# Patient Record
Sex: Female | Born: 1945 | Race: Black or African American | Hispanic: No | Marital: Married | Smoking: Former smoker
Health system: Southern US, Community
[De-identification: ages and names within clinical notes are randomized; demographics above are authoritative.]

## PROBLEM LIST (undated history)

## (undated) DIAGNOSIS — I10 Essential (primary) hypertension: Secondary | ICD-10-CM

## (undated) DIAGNOSIS — E785 Hyperlipidemia, unspecified: Secondary | ICD-10-CM

## (undated) DIAGNOSIS — H409 Unspecified glaucoma: Secondary | ICD-10-CM

## (undated) DIAGNOSIS — K759 Inflammatory liver disease, unspecified: Secondary | ICD-10-CM

## (undated) DIAGNOSIS — H269 Unspecified cataract: Secondary | ICD-10-CM

## (undated) DIAGNOSIS — F329 Major depressive disorder, single episode, unspecified: Secondary | ICD-10-CM

## (undated) DIAGNOSIS — H547 Unspecified visual loss: Secondary | ICD-10-CM

## (undated) DIAGNOSIS — M1990 Unspecified osteoarthritis, unspecified site: Secondary | ICD-10-CM

## (undated) HISTORY — PX: SPINE SURGERY: SHX786

## (undated) HISTORY — PX: BACK SURGERY: SHX140

## (undated) HISTORY — DX: Unspecified visual loss: H54.7

## (undated) HISTORY — PX: EYE SURGERY: SHX253

## (undated) HISTORY — DX: Essential (primary) hypertension: I10

## (undated) HISTORY — DX: Inflammatory liver disease, unspecified: K75.9

## (undated) HISTORY — PX: APPENDECTOMY: SHX54

## (undated) HISTORY — DX: Major depressive disorder, single episode, unspecified: F32.9

## (undated) HISTORY — DX: Unspecified cataract: H26.9

## (undated) HISTORY — PX: COLONOSCOPY: SHX174

## (undated) HISTORY — DX: Unspecified osteoarthritis, unspecified site: M19.90

## (undated) HISTORY — PX: CHOLECYSTECTOMY: SHX55

## (undated) HISTORY — DX: Unspecified glaucoma: H40.9

## (undated) HISTORY — PX: GLAUCOMA SURGERY: SHX656

## (undated) HISTORY — DX: Hyperlipidemia, unspecified: E78.5

---

## 1983-03-20 HISTORY — PX: ABDOMINAL HYSTERECTOMY: SHX81

## 2001-06-12 ENCOUNTER — Other Ambulatory Visit: Admission: RE | Admit: 2001-06-12 | Discharge: 2001-06-12 | Payer: Self-pay | Admitting: Family Medicine

## 2002-06-09 ENCOUNTER — Encounter: Payer: Self-pay | Admitting: Family Medicine

## 2002-06-09 ENCOUNTER — Ambulatory Visit (HOSPITAL_COMMUNITY): Admission: RE | Admit: 2002-06-09 | Discharge: 2002-06-09 | Payer: Self-pay | Admitting: Family Medicine

## 2003-07-22 ENCOUNTER — Ambulatory Visit (HOSPITAL_COMMUNITY): Admission: RE | Admit: 2003-07-22 | Discharge: 2003-07-22 | Payer: Self-pay | Admitting: Family Medicine

## 2004-08-16 ENCOUNTER — Ambulatory Visit: Payer: Self-pay | Admitting: Family Medicine

## 2004-08-23 ENCOUNTER — Ambulatory Visit (HOSPITAL_COMMUNITY): Admission: RE | Admit: 2004-08-23 | Discharge: 2004-08-23 | Payer: Self-pay | Admitting: Family Medicine

## 2004-09-07 ENCOUNTER — Encounter (INDEPENDENT_AMBULATORY_CARE_PROVIDER_SITE_OTHER): Payer: Self-pay | Admitting: General Surgery

## 2004-09-07 ENCOUNTER — Ambulatory Visit (HOSPITAL_COMMUNITY): Admission: RE | Admit: 2004-09-07 | Discharge: 2004-09-07 | Payer: Self-pay | Admitting: General Surgery

## 2004-09-27 ENCOUNTER — Ambulatory Visit: Payer: Self-pay | Admitting: Family Medicine

## 2004-11-15 ENCOUNTER — Ambulatory Visit: Payer: Self-pay | Admitting: Family Medicine

## 2005-09-07 ENCOUNTER — Ambulatory Visit (HOSPITAL_COMMUNITY): Admission: RE | Admit: 2005-09-07 | Discharge: 2005-09-07 | Payer: Self-pay | Admitting: Family Medicine

## 2005-12-17 ENCOUNTER — Encounter: Payer: Self-pay | Admitting: Family Medicine

## 2005-12-17 LAB — CONVERTED CEMR LAB: Pap Smear: NORMAL

## 2005-12-18 ENCOUNTER — Other Ambulatory Visit: Admission: RE | Admit: 2005-12-18 | Discharge: 2005-12-18 | Payer: Self-pay | Admitting: Family Medicine

## 2005-12-18 ENCOUNTER — Ambulatory Visit: Payer: Self-pay | Admitting: Family Medicine

## 2005-12-18 ENCOUNTER — Encounter: Payer: Self-pay | Admitting: Family Medicine

## 2006-03-27 ENCOUNTER — Ambulatory Visit: Payer: Self-pay | Admitting: Family Medicine

## 2006-03-28 ENCOUNTER — Ambulatory Visit (HOSPITAL_COMMUNITY): Admission: RE | Admit: 2006-03-28 | Discharge: 2006-03-28 | Payer: Self-pay | Admitting: Family Medicine

## 2006-04-02 ENCOUNTER — Encounter (HOSPITAL_COMMUNITY): Admission: RE | Admit: 2006-04-02 | Discharge: 2006-05-02 | Payer: Self-pay | Admitting: Family Medicine

## 2006-06-25 ENCOUNTER — Ambulatory Visit: Payer: Self-pay | Admitting: Family Medicine

## 2006-07-05 ENCOUNTER — Ambulatory Visit (HOSPITAL_COMMUNITY): Admission: RE | Admit: 2006-07-05 | Discharge: 2006-07-05 | Payer: Self-pay | Admitting: Family Medicine

## 2006-08-13 ENCOUNTER — Ambulatory Visit: Payer: Self-pay | Admitting: Family Medicine

## 2006-10-11 ENCOUNTER — Ambulatory Visit (HOSPITAL_COMMUNITY): Admission: RE | Admit: 2006-10-11 | Discharge: 2006-10-11 | Payer: Self-pay | Admitting: Family Medicine

## 2006-10-18 ENCOUNTER — Ambulatory Visit: Payer: Self-pay | Admitting: Family Medicine

## 2007-03-20 ENCOUNTER — Encounter: Payer: Self-pay | Admitting: Family Medicine

## 2007-08-21 ENCOUNTER — Encounter: Payer: Self-pay | Admitting: Family Medicine

## 2007-08-21 DIAGNOSIS — E785 Hyperlipidemia, unspecified: Secondary | ICD-10-CM | POA: Insufficient documentation

## 2007-08-21 DIAGNOSIS — I10 Essential (primary) hypertension: Secondary | ICD-10-CM | POA: Insufficient documentation

## 2007-08-25 ENCOUNTER — Ambulatory Visit: Payer: Self-pay | Admitting: Family Medicine

## 2007-08-25 ENCOUNTER — Encounter: Payer: Self-pay | Admitting: Family Medicine

## 2007-08-25 LAB — CONVERTED CEMR LAB
BUN: 15 mg/dL (ref 6–23)
Bilirubin Urine: NEGATIVE
CO2: 25 meq/L (ref 19–32)
Calcium: 10.2 mg/dL (ref 8.4–10.5)
Cholesterol: 237 mg/dL — ABNORMAL HIGH (ref 0–200)
Eosinophils Absolute: 0 10*3/uL (ref 0.0–0.7)
Eosinophils Relative: 1 % (ref 0–5)
Glucose, Bld: 93 mg/dL (ref 70–99)
Glucose, Urine, Semiquant: NEGATIVE
HCT: 40.3 % (ref 36.0–46.0)
Hemoglobin: 12.8 g/dL (ref 12.0–15.0)
Hgb A1c MFr Bld: 5 %
Lymphocytes Relative: 41 % (ref 12–46)
Lymphs Abs: 1.9 10*3/uL (ref 0.7–4.0)
MCV: 94.4 fL (ref 78.0–100.0)
Monocytes Absolute: 0.4 10*3/uL (ref 0.1–1.0)
Monocytes Relative: 9 % (ref 3–12)
Platelets: 273 10*3/uL (ref 150–400)
Protein, U semiquant: NEGATIVE
RBC: 4.27 M/uL (ref 3.87–5.11)
Sodium: 141 meq/L (ref 135–145)
TSH: 1.395 microintl units/mL (ref 0.350–5.50)
Total CHOL/HDL Ratio: 2.7
WBC Urine, dipstick: NEGATIVE
WBC: 4.6 10*3/uL (ref 4.0–10.5)

## 2007-08-27 ENCOUNTER — Encounter: Payer: Self-pay | Admitting: Family Medicine

## 2007-08-28 ENCOUNTER — Encounter: Payer: Self-pay | Admitting: Family Medicine

## 2007-08-28 LAB — CONVERTED CEMR LAB
Albumin: 4.7 g/dL (ref 3.5–5.2)
Alkaline Phosphatase: 86 units/L (ref 39–117)
Bilirubin, Direct: 0.1 mg/dL (ref 0.0–0.3)
Total Bilirubin: 0.8 mg/dL (ref 0.3–1.2)

## 2007-11-14 ENCOUNTER — Ambulatory Visit (HOSPITAL_COMMUNITY): Admission: RE | Admit: 2007-11-14 | Discharge: 2007-11-14 | Payer: Self-pay | Admitting: Family Medicine

## 2007-12-04 ENCOUNTER — Ambulatory Visit: Payer: Self-pay | Admitting: Family Medicine

## 2007-12-04 ENCOUNTER — Other Ambulatory Visit: Admission: RE | Admit: 2007-12-04 | Discharge: 2007-12-04 | Payer: Self-pay | Admitting: Family Medicine

## 2007-12-04 ENCOUNTER — Encounter: Payer: Self-pay | Admitting: Family Medicine

## 2007-12-04 LAB — CONVERTED CEMR LAB: Pap Smear: NORMAL

## 2008-04-06 ENCOUNTER — Ambulatory Visit: Payer: Self-pay | Admitting: Family Medicine

## 2008-04-06 DIAGNOSIS — K3189 Other diseases of stomach and duodenum: Secondary | ICD-10-CM | POA: Insufficient documentation

## 2008-04-06 DIAGNOSIS — R1013 Epigastric pain: Secondary | ICD-10-CM

## 2008-04-07 LAB — CONVERTED CEMR LAB
ALT: 13 units/L (ref 0–35)
AST: 14 units/L (ref 0–37)
Albumin: 4.3 g/dL (ref 3.5–5.2)
Cholesterol: 207 mg/dL — ABNORMAL HIGH (ref 0–200)
HDL: 77 mg/dL (ref >39)
Total Bilirubin: 0.8 mg/dL (ref 0.3–1.2)
Total CHOL/HDL Ratio: 2.7
VLDL: 16 mg/dL (ref 0–40)

## 2008-05-03 ENCOUNTER — Ambulatory Visit (HOSPITAL_COMMUNITY): Admission: RE | Admit: 2008-05-03 | Discharge: 2008-05-03 | Payer: Self-pay | Admitting: General Surgery

## 2008-05-03 ENCOUNTER — Encounter (INDEPENDENT_AMBULATORY_CARE_PROVIDER_SITE_OTHER): Payer: Self-pay | Admitting: General Surgery

## 2008-05-05 ENCOUNTER — Encounter: Payer: Self-pay | Admitting: Family Medicine

## 2008-05-27 ENCOUNTER — Ambulatory Visit: Payer: Self-pay | Admitting: Family Medicine

## 2008-07-16 ENCOUNTER — Ambulatory Visit (HOSPITAL_COMMUNITY): Admission: RE | Admit: 2008-07-16 | Discharge: 2008-07-16 | Payer: Self-pay | Admitting: Family Medicine

## 2008-07-20 ENCOUNTER — Encounter: Payer: Self-pay | Admitting: Family Medicine

## 2008-08-19 ENCOUNTER — Ambulatory Visit: Payer: Self-pay | Admitting: Family Medicine

## 2008-08-19 DIAGNOSIS — M79609 Pain in unspecified limb: Secondary | ICD-10-CM | POA: Insufficient documentation

## 2008-08-19 DIAGNOSIS — M549 Dorsalgia, unspecified: Secondary | ICD-10-CM | POA: Insufficient documentation

## 2008-08-26 ENCOUNTER — Encounter: Payer: Self-pay | Admitting: Family Medicine

## 2008-08-26 LAB — CONVERTED CEMR LAB: Creatinine, Ser: 1.01 mg/dL (ref 0.40–1.20)

## 2008-08-27 ENCOUNTER — Ambulatory Visit (HOSPITAL_COMMUNITY): Admission: RE | Admit: 2008-08-27 | Discharge: 2008-08-27 | Payer: Self-pay | Admitting: Family Medicine

## 2008-12-07 ENCOUNTER — Ambulatory Visit (HOSPITAL_COMMUNITY): Admission: RE | Admit: 2008-12-07 | Discharge: 2008-12-07 | Payer: Self-pay | Admitting: Family Medicine

## 2009-03-24 ENCOUNTER — Encounter: Payer: Self-pay | Admitting: Family Medicine

## 2009-04-14 ENCOUNTER — Ambulatory Visit: Payer: Self-pay | Admitting: Family Medicine

## 2009-04-14 DIAGNOSIS — R5381 Other malaise: Secondary | ICD-10-CM | POA: Insufficient documentation

## 2009-04-14 DIAGNOSIS — R5383 Other fatigue: Secondary | ICD-10-CM

## 2009-05-31 ENCOUNTER — Encounter (INDEPENDENT_AMBULATORY_CARE_PROVIDER_SITE_OTHER): Payer: Self-pay | Admitting: *Deleted

## 2010-04-09 ENCOUNTER — Encounter: Payer: Self-pay | Admitting: Family Medicine

## 2010-04-20 NOTE — Letter (Signed)
Summary: misc  misc   Imported By: Curtis Sites 09/07/2009 12:36:28  _____________________________________________________________________  External Attachment:    Type:   Image     Comment:   External Document

## 2010-04-20 NOTE — Letter (Signed)
Summary: 1st Missed Appt.  [facility]  [address]   [place], [place] [address]   Phone: [phone]  Fax: [phone]    May 31, 2009  MRN: [id number]  CORRINNA KARAPETYAN [address] [place], [place]  [address]  Dear Ms. Corine Shelter,  At [facility], we make every attempt to fit patients into our schedule by reserving several appointment slots for same-day appointments.  However, we cannot always make appointments for patients the same day they are calling.  At the end of the day, we look back at our schedule and find that because of last-minute cancellations and patients not showing up for their scheduled appointments, we have several appointment slots that are left open and could have been used by another person who really needed it.  In the past, you may have been one of the patients who could not get in when you needed to.  But recently, you were one of the patients with an appointment that you didn't show up for or canceled too late for [place] to fill it.  We choose not to charge no-show or last minute cancellation fees to our patients, like many other offices do.  We do not wish to institute that policy and hope we never have to.  However, we kindly request that you assist [place] by providing at least 24 hours' notice if you can't make your appointment.  If no-shows or late cancellations become habitual (i.e. Three or more in a one-year period), we may terminate the physician-patient relationship.    Thank you for your consideration and cooperation.   Altamease Oiler

## 2010-04-20 NOTE — Miscellaneous (Signed)
Summary: refills  Clinical Lists Changes  Medications: Rx of LISINOPRIL-HYDROCHLOROTHIAZIDE 10-12.5 MG TABS (LISINOPRIL-HYDROCHLOROTHIAZIDE) Take 1 tablet by mouth once a day;  #30 x 2;  Signed;  Entered by: Worthy Keeler LPN;  Authorized by: Syliva Overman MD;  Method used: [facility] [phone]*, [address], [place], [place], [place]  [address], Ph: [phone] or [phone], Fax: [phone]    Prescriptions: LISINOPRIL-HYDROCHLOROTHIAZIDE 10-12.5 MG TABS (LISINOPRIL-HYDROCHLOROTHIAZIDE) Take 1 tablet by mouth once a day  #30 x 2   Entered by:   Worthy Keeler LPN   Authorized by:   Syliva Overman MD   Signed by:   Worthy Keeler LPN on 52/84/1324   Method used:   Electronically to        [facility] [phone]* (retail)       [address]       [place], [place]  [address]       Ph: [phone] or [phone]       Fax: [phone]   RxID:   [id number]

## 2010-04-20 NOTE — Letter (Signed)
Summary: xray  xray   Imported By: Curtis Sites 09/07/2009 12:37:26  _____________________________________________________________________  External Attachment:    Type:   Image     Comment:   External Document

## 2010-04-20 NOTE — Letter (Signed)
Summary: history and physical  history and physical   Imported By: Curtis Sites 09/07/2009 12:41:13  _____________________________________________________________________  External Attachment:    Type:   Image     Comment:   External Document

## 2010-04-20 NOTE — Letter (Signed)
Summary: labs  labs   Imported By: Curtis Sites 09/07/2009 12:38:18  _____________________________________________________________________  External Attachment:    Type:   Image     Comment:   External Document

## 2010-04-20 NOTE — Letter (Signed)
Summary: demographic  demographic   Imported By: Curtis Sites 09/07/2009 12:42:01  _____________________________________________________________________  External Attachment:    Type:   Image     Comment:   External Document

## 2010-04-20 NOTE — Assessment & Plan Note (Signed)
Summary: office visit   Vital Signs:  Patient profile:   65 year old female Menstrual status:  hysterectomy Height:      65 inches Weight:      118.50 pounds BMI:     19.79 O2 Sat:      96 % Pulse rate:   80 / minute Pulse rhythm:   regular Resp:     16 per minute BP sitting:   110 / 70  (right arm)  Vitals Entered By: Everitt Amber (April 14, 2009 8:32 AM) CC: Follow up chronic problems Is Patient Diabetic? No Pain Assessment Patient in pain? no        CC:  Follow up chronic problems.  History of Present Illness: Reports  thatshe has been doing well. Denies recent fever or chills. Denies sinus pressure, nasal congestion , ear pain or sore throat. Denies chest congestion, or cough productive of sputum. Denies chest pain, palpitations, PND, orthopnea or leg swelling. Denies abdominal pain, nausea, vomitting, diarrhea or constipation. Denies change in bowel movements or bloody stool. Denies dysuria , frequency, incontinence or hesitancy. Denies  joint pain, swelling, or reduced mobility. Denies headaches, vertigo, seizures. Denies depression, anxiety or insomnia. Denies  rash, lesions, or itch.     Current Medications (verified): 1)  Caltrate 600+d 600-400 Mg-Unit  Tabs (Calcium Carbonate-Vitamin D) .... One Tab By Mouth Two Times A Day 2)  Womens Multivitamin Plus  Tabs (Multiple Vitamins-Minerals) .... Take 1 Tablet By Mouth Once A Day 3)  Lisinopril-Hydrochlorothiazide 10-12.5 Mg Tabs (Lisinopril-Hydrochlorothiazide) .... Take 1 Tablet By Mouth Once A Day  Allergies (verified): No Known Drug Allergies  Social History: Unemployed Married Two children Never Smoked Alcohol use-no Drug use-no  Review of Systems      See HPI Eyes:  Denies blurring and discharge. Psych:  Complains of anxiety; denies depression; hoping to ghain full time employment once more. Endo:  Denies cold intolerance, excessive hunger, excessive thirst, excessive urination, heat  intolerance, polyuria, and weight change. Heme:  Denies abnormal bruising and bleeding. Allergy:  Denies hives or rash.  Physical Exam  General:  alert, well-nourished, and well-hydrated.  in pain. HEENT: No facial asymmetry,  EOMI, No sinus tenderness, TM's Clear, oropharynx  pink and moist.   Chest: Clear to auscultation bilaterally.  CVS: S1, S2, No murmurs, No S3.   Abd: Soft, Nontender.  MS: adequate ROm spine, hips, shoulders and knees  Ext: No edema.   CNS: CN 2-12 intactnormal power and tone throughout  Skin: Intact, no visible lesions or rashes.  Psych: Good eye contact, normal affect.  Memory intact, not anxious or depressed appearing.    Impression & Recommendations:  Problem # 1:  HYPERTENSION (ICD-401.9) Assessment Unchanged  Her updated medication list for this problem includes:    Lisinopril-hydrochlorothiazide 10-12.5 Mg Tabs (Lisinopril-hydrochlorothiazide) .Marland Kitchen... Take 1 tablet by mouth once a day  Orders: T-Basic Metabolic Panel [id number])  BP today: 110/70 Prior BP: 110/80 (08/19/2008)  Labs Reviewed: K+: 4.4 (08/25/2007) Creat: : 1.01 (08/26/2008)   Chol: 207 (04/06/2008)   HDL: 77 (04/06/2008)   LDL: 114 (04/06/2008)   TG: 81 (04/06/2008)  Problem # 2:  DYSLIPIDEMIA (ICD-272.4) Assessment: Comment Only  Orders: T-Lipid Profile [id number])  Labs Reviewed: SGOT: 14 (04/06/2008)   SGPT: 13 (04/06/2008)   HDL:77 (04/06/2008), 88 (08/25/2007)  LDL:114 (04/06/2008), 135 (08/25/2007)  Chol:207 (04/06/2008), 237 (08/25/2007)  Trig:81 (04/06/2008), 68 (08/25/2007)  Complete Medication List: 1)  Caltrate 600+d 600-400 Mg-unit Tabs (Calcium carbonate-vitamin d) .... One tab by  mouth two times a day 2)  Womens Multivitamin Plus Tabs (Multiple vitamins-minerals) .... Take 1 tablet by mouth once a day 3)  Lisinopril-hydrochlorothiazide 10-12.5 Mg Tabs (Lisinopril-hydrochlorothiazide) .... Take 1 tablet by mouth once a day 4)  Ranitidine Hcl 300 Mg Tabs  (Ranitidine hcl) .... Take 1 tablet by mouth once a day as needed 5)  Loratadine 10 Mg Tabs (Loratadine) .... Take 1 tablet by mouth once a day as needed  Other Orders: T-CBC w/Diff ([id number])  Patient Instructions: 1)  F/U  in year. 2)  You need to schedule a mamogram in Sept or after. Calll to arrange to get it free if possble. 3)  [id number], ersp Lubertha Basque 4)  If you note a change in bowel pattern do everything you can to get your colonscopy this year since [place] a family h/o colon cancer, and your last colonscopy was normal in June 2006, it is due June 2011. 5)  We will give you 3 stool cards to return for testing for hidden blood. 6)  BMP prior to visit, ICD-9: 7)  Lipid Panel prior to visit, ICD-9:  fasting asap 8)  CBC w/ Diff prior to visit, ICD-9: Prescriptions: LORATADINE 10 MG TABS (LORATADINE) Take 1 tablet by mouth once a day as needed  #30 x 5   Entered by:   Everitt Amber   Authorized by:   Syliva Overman MD   Signed by:   Everitt Amber on 04/14/2009   Method used:   Electronically to        [facility]  [address] (retail)       [address]       [place], [place]  [address]       Ph: [phone]       Fax: [phone]   RxID:   [id number] RANITIDINE HCL 300 MG TABS (RANITIDINE HCL) Take 1 tablet by mouth once a day as needed  #30 x 5   Entered by:   Everitt Amber   Authorized by:   Syliva Overman MD   Signed by:   Everitt Amber on 04/14/2009   Method used:   Electronically to        [facility]  [address] (retail)       [address]       [place], [place]  [address]       Ph: [phone]       Fax: [phone]   RxID:   [id number] LISINOPRIL-HYDROCHLOROTHIAZIDE 10-12.5 MG TABS (LISINOPRIL-HYDROCHLOROTHIAZIDE) Take 1 tablet by mouth once a day  #30 x 11   Entered by:   Everitt Amber   Authorized by:   Syliva Overman MD   Signed by:   Everitt Amber on 04/14/2009   Method used:   [facility]        [facility] [phone]* (retail)       [address]       [place], [place]  [address]       Ph: [phone] or [phone]       Fax: [phone]   RxID:   [id number]

## 2010-04-20 NOTE — Letter (Signed)
Summary: progress  progress   Imported By: Curtis Sites 09/07/2009 12:39:54  _____________________________________________________________________  External Attachment:    Type:   Image     Comment:   External Document

## 2010-07-04 LAB — HEPATIC FUNCTION PANEL
AST: 18 U/L (ref 0–37)
Albumin: 4.2 g/dL (ref 3.5–5.2)
Total Protein: 7.3 g/dL (ref 6.0–8.3)

## 2010-07-04 LAB — BASIC METABOLIC PANEL
BUN: 15 mg/dL (ref 6–23)
Creatinine, Ser: 0.9 mg/dL (ref 0.4–1.2)
GFR calc non Af Amer: >60 mL/min (ref >60)

## 2010-07-04 LAB — CBC
Platelets: 214 10*3/uL (ref 150–400)
RDW: 13.6 % (ref 11.5–15.5)

## 2010-07-27 ENCOUNTER — Other Ambulatory Visit: Payer: Self-pay | Admitting: Family Medicine

## 2010-08-01 NOTE — Op Note (Signed)
NAMEPHARRAH, ROTTMAN             ACCOUNT NO.:  [id number]   MEDICAL RECORD NO.:  [id number]          PATIENT TYPE:  AMB   LOCATION:  DAY                           FACILITY:  [facility]   PHYSICIAN:  Dalia Heading, M.D.  DATE OF BIRTH:  09/04/1945   DATE OF PROCEDURE:  05/03/2008  DATE OF DISCHARGE:                               OPERATIVE REPORT   PREOPERATIVE DIAGNOSIS:  Chronic cholecystitis.   POSTOPERATIVE DIAGNOSIS:  Chronic cholecystitis.   PROCEDURE:  Laparoscopic cholecystectomy.   SURGEON:  Dalia Heading, MD   ANESTHESIA:  General endotracheal.   INDICATIONS:  The patient is a 65 year old white female who presents  with biliary colic secondary to chronic cholecystitis.  The risks and  benefits of the procedure including bleeding, infection, hepatobiliary,  the possibility of an open procedure were fully explained to the  patient, gave informed consent.   PROCEDURE NOTE:  The patient was placed in supine position.  After  induction of general endotracheal anesthesia, the abdomen was prepped  and draped using usual sterile technique with Betadine.  Surgical site  confirmation was performed.   An infraumbilical incision was made down to fascia.  A Veress needle was  introduced.  The abdominal cavity and confirmation of placement was done  using the saline drop test.  The abdomen was then insufflated to 16 mmHg  pressure.  A 11-mm trocar was introduced into the abdominal cavity under  direct visualization without difficulty.  The patient was placed in  reversed Trendelenburg position.  Additional, 11-mm trocars placed in  the epigastric region and 5-mm trocars were placed in the right upper  quadrant right flank regions.  The liver was inspected and noted to be  within normal limits.  The gallbladder was retracted superior and  laterally.  The dissection was begun around the infundibulum of the  gallbladder.  Cystic duct was first identified.  Juncture to the  infundibulum fully identified.  Endoclips placed proximally and distally  on the cystic duct and cystic duct was divided.  This likewise done in  the cystic artery.  Gallbladder then freed away from the gallbladder  fossa using Bovie electrocautery.  Gallbladder delivered through the  epigastric trocar site using Endocatch bag.  Gallbladder fossa was  inspected.  No abnormal bleeding or bile leakage was noted.  Surgicel  was placed in the gallbladder fossa.  All fluid and air were then  evacuated from the abdominal cavity prior to removal of the trocars.   All wounds were irrigated with normal saline.  All wounds were injected  with 0.5 cm Sensorcaine.  The infraumbilical fascia as well as  epigastric fascia reapproximated using a 3-0 Vicryl interrupted sutures.  All skin incisions were closed using staples.  Betadine ointment and dry  sterile dressings were applied.   All tape and needle counts were correct at the end of the procedure.  The patient was extubated in the operating room and went back to  recovery room in awake and stable condition.   COMPLICATIONS:  None.   SPECIMEN:  Gallbladder.   BLOOD LOSS:  Minimal.  Dalia Heading, M.D.  Electronically Signed     MAJ/MEDQ  D:  05/03/2008  T:  05/03/2008  Job:  [id number]   cc:   Milus Mallick. Lodema Hong, M.D.  Fax: [phone]

## 2010-08-01 NOTE — H&P (Signed)
Christina Miles, Christina Miles             ACCOUNT NO.:  [id number]   MEDICAL RECORD NO.:  [id number]          PATIENT TYPE:  AMB   LOCATION:  DAY                           FACILITY:  [facility]   PHYSICIAN:  Dalia Heading, M.D.  DATE OF BIRTH:  [birth year]   DATE OF ADMISSION:  DATE OF DISCHARGE:  LH                              HISTORY & PHYSICAL   CHIEF COMPLAINT:  Chronic cholecystitis.   HISTORY OF PRESENT ILLNESS:  The patient is a 65 year old black female  who was referred for evaluation and treatment for biliary colic  secondary to chronic cholecystitis.  She has been having intermittent  right upper quadrant abdominal pain, nausea, bloating for sometime now.  This is made worse with fatty foods.  Her symptoms have been worsening  recently.  No fever, chills, or jaundice have been noted.   PAST MEDICAL HISTORY:  Hypertension.   PAST SURGICAL HISTORY:  Hysterectomy and back surgery.   CURRENT MEDICATIONS:  1. Omeprazole 20 mg p.o. daily.  2. Lisinopril/hydrochlorothiazide 10-12.5 mg p.o. daily.   ALLERGIES:  No known drug allergies.   REVIEW OF SYSTEMS:  Noncontributory.   PHYSICAL EXAMINATION:  GENERAL:  The patient is a well-developed, well-  nourished black female in no acute distress.  HEENT:  No scleral icterus.  LUNGS:  Clear to auscultation with equal breath sounds bilaterally.  HEART:  Regular rate and rhythm without S3, S4, or murmurs.  ABDOMEN:  Soft and nondistended.  She is slightly tender in the right  upper quadrant to palpation.  No hepatosplenomegaly, rigidity, or  hernias are noted.   IMPRESSION:  Chronic cholecystitis.   PLAN:  The patient is scheduled for laparoscopic cholecystectomy on  May 03, 2008.  The risks and benefits of the procedure including  bleeding, infection, hepatobiliary injury, and the possibility of an  open procedure were fully explained to the patient, gave informed  consent.      Dalia Heading, M.D.  Electronically  Signed     MAJ/MEDQ  D:  04/15/2008  T:  04/16/2008  Job:  [id number]   cc:   Milus Mallick. Lodema Hong, M.D.  Fax: [phone]

## 2010-08-04 NOTE — H&P (Signed)
Christina Miles, Christina Miles             ACCOUNT NO.:  [id number]   MEDICAL RECORD NO.:  [id number]          PATIENT TYPE:  AMB   LOCATION:  DAY                           FACILITY:  [facility]   PHYSICIAN:  Jerolyn Shin C. Katrinka Blazing, M.D.   DATE OF BIRTH:  Aug 22, 1945   DATE OF ADMISSION:  DATE OF DISCHARGE:  LH                                HISTORY & PHYSICAL   HISTORY OF PRESENT ILLNESS:  The patient is a 65 year old female with a  history of indigestion, anorexia, weight loss of eight pounds documented  over the past year.  She has a history of bowel movements every two to three  days.  There is no history of melena or rectal bleeding.  She is rarely  constipated.  She is scheduled for upper and lower endoscopy.   PAST MEDICAL HISTORY:  1.  Hypertension.  2.  Depression.  3.  Menopause.   MEDICATIONS:  1.  Diovan 80 mg daily.  2.  Prozac 10 mg daily.  3.  Calcium one daily.   PAST SURGICAL HISTORY:  1.  Total abdominal hysterectomy.  2.  Lumbar disk surgery.   PHYSICAL EXAMINATION:  VITAL SIGNS:  Blood pressure 142/86, pulse 68,  respirations 20.  HEENT:  Unremarkable.  NECK:  Supple.  No JVD, bruits, adenopathy, or thyromegaly.  CHEST:  Clear to auscultation.  HEART:  Regular rate and rhythm without murmur, gallop or rub.  ABDOMEN:  Soft, nontender.  No masses.  RECTAL:  Normal.  Normal sphincter tone.  No masses.  Stool guaiac negative.  EXTREMITIES:  No cyanosis, clubbing or edema.  NEUROLOGIC:  No focal motor, sensory or cerebellar deficits.   IMPRESSION:  1.  Gastroesophageal reflux disease.  2.  Chronic anorexia with weight loss.  3.  Need for screening colonoscopy.   PLAN:  Upper endoscopy and total colonoscopy.       LCS/MEDQ  D:  09/07/2004  T:  09/07/2004  Job:  [id number]   cc:   [facility]  Fax: [phone]

## 2011-02-09 ENCOUNTER — Other Ambulatory Visit: Payer: Self-pay | Admitting: Family Medicine

## 2011-02-13 ENCOUNTER — Other Ambulatory Visit: Payer: Self-pay | Admitting: Family Medicine

## 2011-02-13 MED ORDER — LISINOPRIL-HYDROCHLOROTHIAZIDE 10-12.5 MG PO TABS: ORAL_TABLET | ORAL | Status: DC

## 2011-03-31 ENCOUNTER — Other Ambulatory Visit: Payer: Self-pay | Admitting: Family Medicine

## 2011-04-16 ENCOUNTER — Other Ambulatory Visit: Payer: Self-pay | Admitting: Family Medicine

## 2011-10-22 ENCOUNTER — Other Ambulatory Visit: Payer: Self-pay | Admitting: Family Medicine

## 2011-10-22 DIAGNOSIS — Z139 Encounter for screening, unspecified: Secondary | ICD-10-CM

## 2011-10-23 ENCOUNTER — Encounter: Payer: Self-pay | Admitting: Family Medicine

## 2011-10-23 ENCOUNTER — Ambulatory Visit (HOSPITAL_COMMUNITY)
Admission: RE | Admit: 2011-10-23 | Discharge: 2011-10-23 | Disposition: A | Discharge status: Home / Self Care | Payer: BC Managed Care – PPO | Source: Ambulatory Visit | Attending: Family Medicine | Admitting: Family Medicine

## 2011-10-23 ENCOUNTER — Ambulatory Visit (INDEPENDENT_AMBULATORY_CARE_PROVIDER_SITE_OTHER): Payer: BC Managed Care – PPO | Admitting: Family Medicine

## 2011-10-23 VITALS — BP 140/82 | HR 74 | Resp 18 | Ht 62.5 in | Wt 120.1 lb

## 2011-10-23 DIAGNOSIS — Z1231 Encounter for screening mammogram for malignant neoplasm of breast: Secondary | ICD-10-CM | POA: Insufficient documentation

## 2011-10-23 DIAGNOSIS — Z1211 Encounter for screening for malignant neoplasm of colon: Secondary | ICD-10-CM

## 2011-10-23 DIAGNOSIS — IMO0002 Reserved for concepts with insufficient information to code with codable children: Secondary | ICD-10-CM

## 2011-10-23 DIAGNOSIS — Z139 Encounter for screening, unspecified: Secondary | ICD-10-CM

## 2011-10-23 DIAGNOSIS — R5383 Other fatigue: Secondary | ICD-10-CM

## 2011-10-23 DIAGNOSIS — I10 Essential (primary) hypertension: Secondary | ICD-10-CM

## 2011-10-23 DIAGNOSIS — M79609 Pain in unspecified limb: Secondary | ICD-10-CM

## 2011-10-23 DIAGNOSIS — E785 Hyperlipidemia, unspecified: Secondary | ICD-10-CM

## 2011-10-23 DIAGNOSIS — R5381 Other malaise: Secondary | ICD-10-CM

## 2011-10-23 LAB — CBC
HCT: 38.6 % (ref 36.0–46.0)
Hemoglobin: 12.8 g/dL (ref 12.0–15.0)
MCH: 30 pg (ref 26.0–34.0)
MCV: 90.6 fL (ref 78.0–100.0)
RBC: 4.26 MIL/uL (ref 3.87–5.11)

## 2011-10-23 LAB — BASIC METABOLIC PANEL
CO2: 30 mEq/L (ref 19–32)
Calcium: 10.4 mg/dL (ref 8.4–10.5)
Glucose, Bld: 83 mg/dL (ref 70–99)
Sodium: 141 mEq/L (ref 135–145)

## 2011-10-23 LAB — LIPID PANEL: Total CHOL/HDL Ratio: 2.6 Ratio

## 2011-10-23 LAB — TSH: TSH: 0.996 u[IU]/mL (ref 0.350–4.500)

## 2011-10-23 MED ORDER — POTASSIUM CHLORIDE ER 10 MEQ PO TBCR: 10.0000 meq | EXTENDED_RELEASE_TABLET | Freq: Two times a day (BID) | ORAL | Status: DC

## 2011-10-23 MED ORDER — HYDROCHLOROTHIAZIDE 12.5 MG PO CAPS: ORAL_CAPSULE | ORAL | Status: DC

## 2011-10-23 NOTE — Progress Notes (Signed)
  Subjective:    Patient ID: Christina Miles, female    DOB: 1946-02-04, 66 y.o.   MRN: [id number]  HPI The PT is here for follow up, to re establish care, and re-evaluation of chronic medical conditions, medication management and review of any available recent lab and radiology data. None available having them today Preventive health is updated, specifically  Cancer screening and Immunization.    The PT reports dry cough and tickle on BP med, has not been taking regularly. 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 Poor sleep, tired       Review of Systems See HPI Denies recent fever or chills. Denies sinus pressure, nasal congestion, ear pain or sore throat. Denies chest congestion, productive cough or wheezing. Denies chest pains, palpitations and leg swelling Denies abdominal pain, nausea, vomiting,diarrhea or constipation.   Denies dysuria, frequency, hesitancy or incontinence. Denies joint pain, swelling and limitation in mobility. Denies headaches, seizures, numbness, or tingling. Denies skin break down or rash.        Objective:   Physical Exam  Patient alert and oriented and in no cardiopulmonary distress.  HEENT: No facial asymmetry, EOMI, no sinus tenderness,  oropharynx pink and moist.  Neck supple no adenopathy.  Chest: Clear to auscultation bilaterally.  CVS: S1, S2 no murmurs, no S3.  ABD: Soft non tender. Bowel sounds normal.  Ext: No edema  MS: Adequate ROM spine, shoulders, hips and knees.  Skin: Intact, no ulcerations or rash noted.  Psych: Good eye contact, normal affect. Memory intact not anxious or depressed appearing.  CNS: CN 2-12 intact, power, tone and sensation normal throughout.       Assessment & Plan:

## 2011-10-23 NOTE — Assessment & Plan Note (Signed)
Uncontrolled, describes ACE tickle, has been non compliant with meds

## 2011-10-23 NOTE — Assessment & Plan Note (Signed)
Occasional pain associated with lifting Mother

## 2011-10-23 NOTE — Assessment & Plan Note (Signed)
Improved since not working

## 2011-10-23 NOTE — Assessment & Plan Note (Signed)
Hyperlipidemia:Low fat diet discussed and encouraged.  Updated lab today 

## 2011-10-23 NOTE — Patient Instructions (Addendum)
CPE and pap un Novemebr.  Fasting CBC, chem 7, lipid, TSH, vit D today.  You are referred for screening colonoscopy.  Blood pressure elevated today, New bP medication due to cough on current one.   Please schedule eye exam as able.  You need a shingles vaccine, check with your insurance for coverage, we do have the vaccine here

## 2011-10-24 ENCOUNTER — Other Ambulatory Visit: Payer: Self-pay | Admitting: Family Medicine

## 2011-10-25 ENCOUNTER — Encounter (INDEPENDENT_AMBULATORY_CARE_PROVIDER_SITE_OTHER): Payer: Self-pay | Admitting: *Deleted

## 2011-11-07 ENCOUNTER — Other Ambulatory Visit: Payer: Self-pay

## 2011-11-07 MED ORDER — ERGOCALCIFEROL 1.25 MG (50000 UT) PO CAPS: 50000.0000 [IU] | ORAL_CAPSULE | ORAL | Status: DC

## 2011-12-12 ENCOUNTER — Other Ambulatory Visit (INDEPENDENT_AMBULATORY_CARE_PROVIDER_SITE_OTHER): Payer: Self-pay | Admitting: *Deleted

## 2011-12-12 ENCOUNTER — Encounter (INDEPENDENT_AMBULATORY_CARE_PROVIDER_SITE_OTHER): Payer: Self-pay | Admitting: *Deleted

## 2011-12-12 ENCOUNTER — Telehealth (INDEPENDENT_AMBULATORY_CARE_PROVIDER_SITE_OTHER): Payer: Self-pay | Admitting: *Deleted

## 2011-12-12 DIAGNOSIS — Z1211 Encounter for screening for malignant neoplasm of colon: Secondary | ICD-10-CM

## 2011-12-12 DIAGNOSIS — Z800 Family history of malignant neoplasm of digestive organs: Secondary | ICD-10-CM

## 2011-12-12 NOTE — Telephone Encounter (Signed)
Patient needs movi prep 

## 2011-12-13 MED ORDER — PEG-KCL-NACL-NASULF-NA ASC-C 100 G PO SOLR: 1.0000 | Freq: Once | ORAL | Status: DC

## 2012-01-23 ENCOUNTER — Ambulatory Visit (INDEPENDENT_AMBULATORY_CARE_PROVIDER_SITE_OTHER): Payer: BC Managed Care – PPO | Admitting: Family Medicine

## 2012-01-23 ENCOUNTER — Encounter: Payer: Self-pay | Admitting: Family Medicine

## 2012-01-23 ENCOUNTER — Other Ambulatory Visit (HOSPITAL_COMMUNITY)
Admission: RE | Admit: 2012-01-23 | Discharge: 2012-01-23 | Disposition: A | Discharge status: Home / Self Care | Payer: BC Managed Care – PPO | Source: Ambulatory Visit | Attending: Family Medicine | Admitting: Family Medicine

## 2012-01-23 VITALS — BP 120/80 | HR 82 | Resp 15 | Ht 62.5 in | Wt 116.1 lb

## 2012-01-23 DIAGNOSIS — Z1272 Encounter for screening for malignant neoplasm of vagina: Secondary | ICD-10-CM

## 2012-01-23 DIAGNOSIS — E785 Hyperlipidemia, unspecified: Secondary | ICD-10-CM

## 2012-01-23 DIAGNOSIS — Z0000 Encounter for general adult medical examination without abnormal findings: Secondary | ICD-10-CM

## 2012-01-23 DIAGNOSIS — Z23 Encounter for immunization: Secondary | ICD-10-CM

## 2012-01-23 DIAGNOSIS — N3000 Acute cystitis without hematuria: Secondary | ICD-10-CM

## 2012-01-23 DIAGNOSIS — Z2911 Encounter for prophylactic immunotherapy for respiratory syncytial virus (RSV): Secondary | ICD-10-CM

## 2012-01-23 DIAGNOSIS — Z01419 Encounter for gynecological examination (general) (routine) without abnormal findings: Secondary | ICD-10-CM | POA: Insufficient documentation

## 2012-01-23 DIAGNOSIS — I10 Essential (primary) hypertension: Secondary | ICD-10-CM

## 2012-01-23 LAB — POCT URINALYSIS DIPSTICK
Ketones, UA: NEGATIVE
Protein, UA: NEGATIVE
Spec Grav, UA: 1.025
pH, UA: 5.5

## 2012-01-23 NOTE — Progress Notes (Signed)
  Subjective:    Patient ID: Christina Miles, female    DOB: 03-05-1946, 66 y.o.   MRN: [id number]  HPI The PT is here for annual exam  and re-evaluation of chronic medical conditions, medication management and review of any available recent lab and radiology data.  Preventive health is updated, specifically  Cancer screening and Immunization.  Colonoscopy is in the near future  The PT denies any adverse reactions to current medications since the last visit.  There are no new concerns.  There are no specific complaints       Review of Systems    See HPI Denies recent fever or chills. Denies sinus pressure, nasal congestion, ear pain or sore throat. Denies chest congestion, productive cough or wheezing. Denies chest pains, palpitations and leg swelling Denies abdominal pain, nausea, vomiting,diarrhea or constipation.   Denies dysuria, frequency, hesitancy or incontinence. Denies joint pain, swelling and limitation in mobility. Denies headaches, seizures, numbness, or tingling. Denies depression, anxiety or insomnia. Denies skin break down or rash.     Objective:   Physical Exam  Pleasant well nourished female, alert and oriented x 3, in no cardio-pulmonary distress. Afebrile. HEENT No facial trauma or asymetry. Sinuses non tender.  EOMI, PERTL, fundoscopic exam no hemorhage or exudate.  External ears normal, tympanic membranes clear. Oropharynx moist, no exudate, good dentition. Neck: supple, no adenopathy,JVD or thyromegaly.No bruits.  Chest: Clear to ascultation bilaterally.No crackles or wheezes. Non tender to palpation  Breast: No asymetry,no masses. No nipple discharge or inversion. No axillary or supraclavicular adenopathy  Cardiovascular system; Heart sounds normal,  S1 and  S2 ,no S3.  No murmur, or thrill. Apical beat not displaced Peripheral pulses normal.  Abdomen: Soft, non tender, no organomegaly or masses. No bruits. Bowel sounds normal. No  guarding, tenderness or rebound.  Rectal:  Deferred, colonoscopy in the near future.  GU: External genitalia normal. No lesions. Vaginal canal normal.No discharge. Uterus absent, no adnexal masses, no  adnexal tenderness.  Musculoskeletal exam: Full ROM of spine, hips , shoulders and knees. No deformity ,swelling or crepitus noted. No muscle wasting or atrophy.   Neurologic: Cranial nerves 2 to 12 intact. Power, tone ,sensation and reflexes normal throughout. No disturbance in gait. No tremor.  Skin: Intact, no ulceration, erythema , scaling or rash noted. Pigmentation normal throughout  Psych; Normal mood and affect. Judgement and concentration normal       Assessment & Plan:

## 2012-01-23 NOTE — Patient Instructions (Addendum)
F/u in 5.5 month, please call if you need me before  Urine is being checked today  Flu and zostavax today   It is important that you exercise regularly at least 30 minutes 5 times a week. If you develop chest pain, have severe difficulty breathing, or feel very tired, stop exercising immediately and seek medical attention

## 2012-01-24 ENCOUNTER — Telehealth (INDEPENDENT_AMBULATORY_CARE_PROVIDER_SITE_OTHER): Payer: Self-pay | Admitting: *Deleted

## 2012-01-24 NOTE — Telephone Encounter (Signed)
,   Procedure: tcs  Reason/Indication:  Screening, fam hx colon ca  Has patient had this procedure before?  yes  If so, when, by whom and where?  6-7 yrs ago  Is there a family history of colon cancer?  yes  Who?  What age when diagnosed?  father  Is patient diabetic?   no      Does patient have prosthetic heart valve?  no  Do you have a pacemaker?  no  Has patient had joint replacement within last 12 months?  no  Is patient on Coumadin, Plavix and/or Aspirin? yes  Medications: asa 81 mg daily, hctz 12.5 mg daily, calcium daily, klor con bid  Allergies: nkda  Medication Adjustment: asa 2 days  Procedure date & time: 02/20/12 at 930

## 2012-01-25 LAB — URINE CULTURE
Colony Count: NO GROWTH
Organism ID, Bacteria: NO GROWTH

## 2012-01-25 NOTE — Assessment & Plan Note (Addendum)
Annual exam including pelvic and breast done, no abnormalities on exam, pt also has healthy diet and is physically active. She has no mental health challenges . Cancer screening will soon be complete, she has an appointment for colonoscopy in the near future.' Immunization is being updated Discussed the importance of continued commitment to regular physical activity and healthy diet , rich in fresh/frozen fruit and vegetables

## 2012-01-25 NOTE — Assessment & Plan Note (Signed)
Hyperlipidemia:Low fat diet discussed and encouraged.   

## 2012-01-25 NOTE — Assessment & Plan Note (Signed)
Controlled, no change in medication DASH diet and commitment to daily physical activity for a minimum of 30 minutes discussed and encouraged, as a part of hypertension management. The importance of attaining a healthy weight is also discussed.  

## 2012-01-25 NOTE — Assessment & Plan Note (Signed)
C/o urinary frequency with abnormal UA, will send urine for c/s and treat based on results

## 2012-01-28 NOTE — Telephone Encounter (Signed)
agree

## 2012-02-11 ENCOUNTER — Encounter (HOSPITAL_COMMUNITY): Payer: Self-pay | Admitting: Pharmacy Technician

## 2012-02-19 MED ORDER — SODIUM CHLORIDE 0.45 % IV SOLN
INTRAVENOUS | Status: DC
  Administered 2012-02-20: 09:00:00 via INTRAVENOUS

## 2012-02-20 ENCOUNTER — Encounter (HOSPITAL_COMMUNITY): Payer: Self-pay | Admitting: *Deleted

## 2012-02-20 ENCOUNTER — Ambulatory Visit (HOSPITAL_COMMUNITY)
Admission: RE | Admit: 2012-02-20 | Discharge: 2012-02-20 | Disposition: A | Discharge status: Home / Self Care | Payer: BC Managed Care – PPO | Source: Ambulatory Visit | Attending: Internal Medicine | Admitting: Internal Medicine

## 2012-02-20 ENCOUNTER — Encounter (HOSPITAL_COMMUNITY)
Admission: RE | Disposition: A | Discharge status: Home / Self Care | Payer: Self-pay | Source: Ambulatory Visit | Attending: Internal Medicine

## 2012-02-20 DIAGNOSIS — K644 Residual hemorrhoidal skin tags: Secondary | ICD-10-CM

## 2012-02-20 DIAGNOSIS — Z800 Family history of malignant neoplasm of digestive organs: Secondary | ICD-10-CM

## 2012-02-20 DIAGNOSIS — I10 Essential (primary) hypertension: Secondary | ICD-10-CM | POA: Insufficient documentation

## 2012-02-20 DIAGNOSIS — Z1211 Encounter for screening for malignant neoplasm of colon: Secondary | ICD-10-CM

## 2012-02-20 DIAGNOSIS — D126 Benign neoplasm of colon, unspecified: Secondary | ICD-10-CM | POA: Insufficient documentation

## 2012-02-20 DIAGNOSIS — K6389 Other specified diseases of intestine: Secondary | ICD-10-CM

## 2012-02-20 HISTORY — PX: COLONOSCOPY: SHX5424

## 2012-02-20 SURGERY — COLONOSCOPY
Anesthesia: Moderate Sedation

## 2012-02-20 MED ORDER — MEPERIDINE HCL 50 MG/ML IJ SOLN
INTRAMUSCULAR | Status: DC | PRN
  Administered 2012-02-20 (×2): 25 mg via INTRAVENOUS

## 2012-02-20 MED ORDER — MIDAZOLAM HCL 5 MG/5ML IJ SOLN
INTRAMUSCULAR | Status: AC
  Filled 2012-02-20: qty 10

## 2012-02-20 MED ORDER — MEPERIDINE HCL 50 MG/ML IJ SOLN
INTRAMUSCULAR | Status: AC
  Filled 2012-02-20: qty 1

## 2012-02-20 MED ORDER — MIDAZOLAM HCL 5 MG/5ML IJ SOLN
INTRAMUSCULAR | Status: DC | PRN
  Administered 2012-02-20 (×3): 2 mg via INTRAVENOUS

## 2012-02-20 NOTE — Op Note (Signed)
COLONOSCOPY PROCEDURE REPORT  PATIENT:  Christina Miles  MR#:  [id number] Birthdate:  1946/03/01, 66 y.o., female Endoscopist:  Dr. Malissa Hippo, MD Referred By:  Dr. Syliva Overman, MD Procedure Date: 02/20/2012  Procedure:   Colonoscopy  Indications: Patient is 66 year old African female was undergoing either screening colonoscopy. Her last exam was in 2006. Family history significant for colon carcinoma in father and 2 paternal uncles in their 68s.  Informed Consent:  The procedure and risks were reviewed with the patient and informed consent was obtained.  Medications:  Demerol 50 mg IV Versed 6 mg IV  Description of procedure:  After a digital rectal exam was performed, that colonoscope was advanced from the anus through the rectum and colon to the area of the cecum, ileocecal valve and appendiceal orifice. The cecum was deeply intubated. These structures were well-seen and photographed for the record. From the level of the cecum and ileocecal valve, the scope was slowly and cautiously withdrawn. The mucosal surfaces were carefully surveyed utilizing scope tip to flexion to facilitate fold flattening as needed. The scope was pulled down into the rectum where a thorough exam including retroflexion was performed.  Findings:   Prep satisfactory. Three small polyps ablated via cold biopsy and submitted together. One was located at splenic flexure and others to at sigmoid colon. Normal rectal mucosa. Small hemorrhoids below the dentate line along with single anal papilla.  Therapeutic/Diagnostic Maneuvers Performed:  See above  Complications:  None  Cecal Withdrawal Time:  14 minutes  Impression:  Examination performed to cecum. Three small polyps ablated via cold biopsy and submitted together(splenic flexure and sigmoid colon). Small external hemorrhoids and single anal papilla.   Recommendations:  Standard instructions given. I will contact patient with results and  further recommendations.  Christina Miles,Christina Miles  02/20/2012 9:55 AM  CC: Dr. Syliva Overman, MD & Dr. Bonnetta Barry ref. provider found

## 2012-02-20 NOTE — H&P (Signed)
Christina Miles is an 66 y.o. female.   Chief Complaint: Patient is here for colonoscopy. HPI: Patient is 66 year old female who is here for screening colonoscopy. Patient's last exam was in June 2006. She denies abdominal pain change in her bowel habits or rectal bleeding. Family history significant for colon carcinoma in her father who is in 49s and 2 paternal uncles who also had colon cancer.  Past Medical History  Diagnosis Date  . Hypertension   . Hyperlipidemia     Past Surgical History  Procedure Date  . Abdominal hysterectomy   . Cholecystectomy   . Back surgery     Family History  Problem Relation Age of Onset  . Colon cancer Father    Social History:  reports that she has quit smoking. Her smoking use included Cigarettes. She has a 10 pack-year smoking history. She does not have any smokeless tobacco history on file. She reports that she does not drink alcohol or use illicit drugs.  Allergies:  Allergies  Allergen Reactions  . Ace Inhibitors Cough    Medications Prior to Admission  Medication Sig Dispense Refill  . aspirin 81 MG tablet Take 81 mg by mouth daily.      . Calcium Carbonate-Vitamin D (CALTRATE 600+D) 600-400 MG-UNIT per tablet Take 2 tablets by mouth daily.      . ergocalciferol (VITAMIN D2) 50000 UNITS capsule Take 50,000 Units by mouth once a week.      . hydrochlorothiazide (MICROZIDE) 12.5 MG capsule Take 12.5 mg by mouth daily.      . peg 3350 powder (MOVIPREP) 100 G SOLR Take 1 kit (100 g total) by mouth once.  1 kit  0  . potassium chloride (K-DUR) 10 MEQ tablet Take 1 tablet (10 mEq total) by mouth 2 (two) times daily.  60 tablet  5    No results found for this or any previous visit (from the past 48 hour(s)). No results found.  ROS  Blood pressure 140/90, pulse 70, temperature 98.3 F (36.8 C), temperature source Oral, resp. rate 20, height 5' 2.5" (1.588 m), weight 116 lb (52.617 kg), SpO2 99.00%. Physical Exam  Constitutional: She  appears well-developed and well-nourished.  HENT:  Mouth/Throat: Oropharynx is clear and moist.  Eyes: Conjunctivae normal are normal. No scleral icterus.  Neck: No thyromegaly present.  Cardiovascular: Normal rate, regular rhythm and normal heart sounds.   No murmur heard. Respiratory: Effort normal and breath sounds normal.  GI: Soft. She exhibits no distension and no mass. There is no tenderness.  Musculoskeletal: She exhibits no edema.  Lymphadenopathy:    She has no cervical adenopathy.  Neurological: She is alert.  Skin: Skin is warm and dry.     Assessment/Plan Family history of colon carcinoma in first degree relative and 2 second-degree relatives at late onset(father and 2 paternal uncles). High-risk screening colonoscopy.  Taleisha Kaczynski U 02/20/2012, 9:16 AM

## 2012-02-25 ENCOUNTER — Encounter (HOSPITAL_COMMUNITY): Payer: Self-pay | Admitting: Internal Medicine

## 2012-03-19 DIAGNOSIS — F32A Depression, unspecified: Secondary | ICD-10-CM

## 2012-03-19 HISTORY — DX: Depression, unspecified: F32.A

## 2012-04-02 ENCOUNTER — Ambulatory Visit: Payer: BC Managed Care – PPO | Admitting: Family Medicine

## 2012-04-07 ENCOUNTER — Ambulatory Visit (HOSPITAL_COMMUNITY)
Admission: RE | Admit: 2012-04-07 | Discharge: 2012-04-07 | Disposition: A | Discharge status: Home / Self Care | Payer: BC Managed Care – PPO | Source: Ambulatory Visit | Attending: Family Medicine | Admitting: Family Medicine

## 2012-04-07 ENCOUNTER — Ambulatory Visit (INDEPENDENT_AMBULATORY_CARE_PROVIDER_SITE_OTHER): Payer: BC Managed Care – PPO | Admitting: Family Medicine

## 2012-04-07 ENCOUNTER — Encounter: Payer: Self-pay | Admitting: Family Medicine

## 2012-04-07 VITALS — BP 128/72 | HR 71 | Resp 18 | Ht 62.5 in | Wt 117.0 lb

## 2012-04-07 DIAGNOSIS — E785 Hyperlipidemia, unspecified: Secondary | ICD-10-CM

## 2012-04-07 DIAGNOSIS — R071 Chest pain on breathing: Secondary | ICD-10-CM

## 2012-04-07 DIAGNOSIS — K3189 Other diseases of stomach and duodenum: Secondary | ICD-10-CM

## 2012-04-07 DIAGNOSIS — R0789 Other chest pain: Secondary | ICD-10-CM | POA: Insufficient documentation

## 2012-04-07 DIAGNOSIS — R05 Cough: Secondary | ICD-10-CM | POA: Insufficient documentation

## 2012-04-07 DIAGNOSIS — N951 Menopausal and female climacteric states: Secondary | ICD-10-CM

## 2012-04-07 DIAGNOSIS — R1013 Epigastric pain: Secondary | ICD-10-CM

## 2012-04-07 DIAGNOSIS — I10 Essential (primary) hypertension: Secondary | ICD-10-CM | POA: Insufficient documentation

## 2012-04-07 DIAGNOSIS — R079 Chest pain, unspecified: Secondary | ICD-10-CM | POA: Insufficient documentation

## 2012-04-07 DIAGNOSIS — R059 Cough, unspecified: Secondary | ICD-10-CM | POA: Insufficient documentation

## 2012-04-07 NOTE — Assessment & Plan Note (Signed)
Controlled, no change in medication DASH diet and commitment to daily physical activity for a minimum of 30 minutes discussed and encouraged, as a part of hypertension management. The importance of attaining a healthy weight is also discussed.  

## 2012-04-07 NOTE — Assessment & Plan Note (Signed)
Irregular eating and poor appetite, increased gas pains and also right flank pain she is s/p cholecysteciopmy. Will make every effort to eat more regualrly

## 2012-04-07 NOTE — Progress Notes (Signed)
  Subjective:    Patient ID: Christina Miles, female    DOB: [birth year], 67 y.o.   MRN: [id number]  HPI 1 week ago pt experience  Right sided lower rib cage pain, up to a 6 , upper body movement aggravated, unaware of any inciting incident, however is involved in cleaning and was also clearing  A trailer over the holiday. Noted increased  Post nasal draianage with some yellow sputum at times , now clearing, nofever or chills. Pt and spouse note excessive belching and bloating in the past monh, she has had a cholecystectomy, her appetite, though never big is even more decreased due to ill health of her mm whose care is increasingly demanding   Review of Systems    See HPI Denies recent fever or chills. Denies sinus pressure, nasal congestion, ear pain or sore throat. Denies chest congestion, productive cough or wheezing. Denies PND, orhtopnea, palpitations and leg swelling .   Denies dysuria, frequency, hesitancy or incontinence. Denies joint pain, swelling and limitation in mobility. Denies headaches, seizures, numbness, or tingling. Denies depression, anxiety or insomnia. Denies skin break down or rash.     Objective:   Physical Exam Patient alert and oriented and in no cardiopulmonary distress.  HEENT: No facial asymmetry, EOMI, no sinus tenderness,  oropharynx pink and moist.  Neck supple no adenopathy.  Chest: Clear to auscultation bilaterally.  CVS: S1, S2 no murmurs, no S3.  ABD: Soft non tender. Bowel sounds normal.  Ext: No edema  MS: Adequate ROM spine, shoulders, hips and knees.  Skin: Intact, no ulcerations or rash noted.  Psych: Good eye contact, normal affect. Memory intact not anxious or depressed appearing.  CNS: CN 2-12 intact, power, tone and sensation normal throughout.        Assessment & Plan:

## 2012-04-07 NOTE — Assessment & Plan Note (Signed)
Htn, h/o 2nd hand smoke exposure, CXR today

## 2012-04-07 NOTE — Assessment & Plan Note (Signed)
Uncontrolled, conservative management

## 2012-04-07 NOTE — Patient Instructions (Addendum)
Please f/u in 6 month, call if you need me before  CXR today for chest wall pain, and other complaints.  Pain in your right side worse with movement is likely due to muscle strain, be careful with lifting and moving things.  Please commit to regular eating to improve your health and reduce gassy bloated feeling  No changes in medication.  You will get information on handling hot flashes associated with menopause

## 2012-04-07 NOTE — Assessment & Plan Note (Signed)
Hyperlipidemia:Low fat diet discussed and encouraged.  Updated labs next visit

## 2012-05-14 ENCOUNTER — Other Ambulatory Visit: Payer: Self-pay | Admitting: Family Medicine

## 2012-07-22 ENCOUNTER — Ambulatory Visit: Payer: BC Managed Care – PPO | Admitting: Family Medicine

## 2012-09-05 ENCOUNTER — Other Ambulatory Visit: Payer: Self-pay | Admitting: Family Medicine

## 2012-10-06 ENCOUNTER — Encounter: Payer: Self-pay | Admitting: Family Medicine

## 2012-10-06 ENCOUNTER — Ambulatory Visit: Payer: BC Managed Care – PPO | Admitting: Family Medicine

## 2012-10-22 ENCOUNTER — Other Ambulatory Visit: Payer: Self-pay | Admitting: Family Medicine

## 2012-10-22 DIAGNOSIS — Z139 Encounter for screening, unspecified: Secondary | ICD-10-CM

## 2012-10-25 ENCOUNTER — Other Ambulatory Visit: Payer: Self-pay | Admitting: Family Medicine

## 2012-10-26 ENCOUNTER — Other Ambulatory Visit: Payer: Self-pay | Admitting: Family Medicine

## 2012-10-27 ENCOUNTER — Ambulatory Visit (HOSPITAL_COMMUNITY)
Admission: RE | Admit: 2012-10-27 | Discharge: 2012-10-27 | Disposition: A | Discharge status: Home / Self Care | Payer: BC Managed Care – PPO | Source: Ambulatory Visit | Attending: Family Medicine | Admitting: Family Medicine

## 2012-10-27 DIAGNOSIS — Z1231 Encounter for screening mammogram for malignant neoplasm of breast: Secondary | ICD-10-CM | POA: Insufficient documentation

## 2012-10-27 DIAGNOSIS — Z139 Encounter for screening, unspecified: Secondary | ICD-10-CM

## 2012-11-05 ENCOUNTER — Ambulatory Visit (INDEPENDENT_AMBULATORY_CARE_PROVIDER_SITE_OTHER): Payer: BC Managed Care – PPO | Admitting: Family Medicine

## 2012-11-05 ENCOUNTER — Encounter: Payer: Self-pay | Admitting: Family Medicine

## 2012-11-05 VITALS — BP 132/80 | HR 73 | Resp 16 | Ht 62.5 in | Wt 112.0 lb

## 2012-11-05 DIAGNOSIS — I10 Essential (primary) hypertension: Secondary | ICD-10-CM

## 2012-11-05 DIAGNOSIS — F329 Major depressive disorder, single episode, unspecified: Secondary | ICD-10-CM

## 2012-11-05 DIAGNOSIS — G4700 Insomnia, unspecified: Secondary | ICD-10-CM | POA: Insufficient documentation

## 2012-11-05 DIAGNOSIS — R0789 Other chest pain: Secondary | ICD-10-CM

## 2012-11-05 DIAGNOSIS — J309 Allergic rhinitis, unspecified: Secondary | ICD-10-CM

## 2012-11-05 DIAGNOSIS — E785 Hyperlipidemia, unspecified: Secondary | ICD-10-CM

## 2012-11-05 DIAGNOSIS — J302 Other seasonal allergic rhinitis: Secondary | ICD-10-CM

## 2012-11-05 DIAGNOSIS — K3189 Other diseases of stomach and duodenum: Secondary | ICD-10-CM

## 2012-11-05 DIAGNOSIS — Z1211 Encounter for screening for malignant neoplasm of colon: Secondary | ICD-10-CM

## 2012-11-05 DIAGNOSIS — Z139 Encounter for screening, unspecified: Secondary | ICD-10-CM

## 2012-11-05 DIAGNOSIS — F32A Depression, unspecified: Secondary | ICD-10-CM

## 2012-11-05 LAB — BASIC METABOLIC PANEL
BUN: 12 mg/dL (ref 6–23)
Calcium: 11 mg/dL — ABNORMAL HIGH (ref 8.4–10.5)
Creat: 0.86 mg/dL (ref 0.50–1.10)
Glucose, Bld: 88 mg/dL (ref 70–99)

## 2012-11-05 LAB — CBC WITH DIFFERENTIAL/PLATELET
Eosinophils Relative: 1 % (ref 0–5)
Lymphocytes Relative: 31 % (ref 12–46)
Lymphs Abs: 1.6 10*3/uL (ref 0.7–4.0)
MCV: 90.9 fL (ref 78.0–100.0)
Neutro Abs: 3.1 10*3/uL (ref 1.7–7.7)
Neutrophils Relative %: 59 % (ref 43–77)
Platelets: 295 10*3/uL (ref 150–400)
RBC: 4.41 MIL/uL (ref 3.87–5.11)
WBC: 5.2 10*3/uL (ref 4.0–10.5)

## 2012-11-05 LAB — LIPID PANEL
Cholesterol: 224 mg/dL — ABNORMAL HIGH (ref 0–200)
HDL: 97 mg/dL (ref >39)
Total CHOL/HDL Ratio: 2.3 Ratio
Triglycerides: 61 mg/dL (ref <150)

## 2012-11-05 LAB — TSH: TSH: 1.219 u[IU]/mL (ref 0.350–4.500)

## 2012-11-05 MED ORDER — POTASSIUM CHLORIDE ER 10 MEQ PO TBCR: EXTENDED_RELEASE_TABLET | ORAL | Status: DC

## 2012-11-05 MED ORDER — FLUTICASONE PROPIONATE 50 MCG/ACT NA SUSP: 2.0000 | Freq: Every day | NASAL | Status: DC

## 2012-11-05 MED ORDER — HYDROCHLOROTHIAZIDE 12.5 MG PO CAPS: ORAL_CAPSULE | ORAL | Status: DC

## 2012-11-05 MED ORDER — MIRTAZAPINE 7.5 MG PO TABS: 7.5000 mg | ORAL_TABLET | Freq: Every day | ORAL | Status: DC

## 2012-11-05 NOTE — Progress Notes (Signed)
  Subjective:    Patient ID: Christina Miles, female    DOB: 08-12-1945, 67 y.o.   MRN: [id number]  HPI  The PT is here for follow up and re-evaluation of chronic medical conditions, medication management and review of any available recent lab and radiology data.  Preventive health is updated, specifically  Cancer screening and Immunization.  Needs labs . The PT denies any adverse reactions to current medications since the last visit.  1 month h/o intermittent central chest tightness,less than 5 minutes, associated with symptoms of anxiety, no aggravating symptoms,certainly not with activity Has also noted anxiety before singing with her choir in the past month Sleep is disturbed x 2 months, early awakening , thinking about issues Overwhelmed with poor health of her mom and her spouse Appetite is poor with weight loss, denies abdominal pain or change in BM     Review of Systems See HPI Denies recent fever or chills. Denies sinus pressure, nasal congestion, ear pain or sore throat.c/o PNd , with intermittent cough and chest congestion Denies chest congestion, productive cough or wheezing. Denies palpitations and leg swelling. Denies PND or orthopnea Denies abdominal pain, nausea, vomiting,diarrhea or constipation.   Denies dysuria, frequency, hesitancy or incontinence. Denies joint pain, swelling and limitation in mobility. Denies headaches, seizures, numbness, or tingling. .Denies skin break down or rash.        Objective:   Physical Exam  Patient alert and oriented and in no cardiopulmonary distress.  HEENT: No facial asymmetry, EOMI, no sinus tenderness,  oropharynx pink and moist.  Neck supple no adenopathy.  Chest: Clear to auscultation bilaterally.no reproducible chest wall tenderness  CVS: S1, S2 no murmurs, no S3.  ABD: Soft non tender. Bowel sounds normal.No guarding or rebound tenderness  Ext: No edema  MS: Adequate ROM spine, shoulders, hips and  knees.  Skin: Intact, no ulcerations or rash noted.  Psych: Good eye contact,. Memory intact  Anxious, tearful and depressed appearing.  CNS: CN 2-12 intact, power, tone and sensation normal throughout.       Assessment & Plan:

## 2012-11-05 NOTE — Patient Instructions (Addendum)
F/u in 6 weeks, call if you need me before  Rectal exam and EKG today  You are being treated for depression, pls also seek therapy through [facility], this will help a lot, and try to walk for two sessions every day   Christina Miles , your medication for depression will help with sleep and appetitie  Flonase is new for allergies  Labs today CB[facility], lipid, chem 7, TSH and H pylori and vit D  Commit to aspirin 81mg  one daily for stroke risk reduction  And calcium with Vit D daily for bone health please

## 2012-11-06 NOTE — Assessment & Plan Note (Signed)
Start medication and also pt to seek therapy through [facility]

## 2012-11-06 NOTE — Assessment & Plan Note (Signed)
Controlled, no change in medication  

## 2012-11-06 NOTE — Assessment & Plan Note (Signed)
Controlled, no change in medication DASH diet and commitment to daily physical activity for a minimum of 30 minutes discussed and encouraged, as a part of hypertension management. The importance of attaining a healthy weight is also discussed.  

## 2012-11-06 NOTE — Assessment & Plan Note (Signed)
History is inconsistent with cardiac etiology , also EKG shows NSR Pt reassured no cardiac cause for pain at this time, linked to stress and anxiety

## 2012-11-06 NOTE — Assessment & Plan Note (Addendum)
Uncontrolled, dietary management only at this time Low fat diet discussed and encouraged.

## 2012-11-13 ENCOUNTER — Other Ambulatory Visit: Payer: Self-pay | Admitting: Family Medicine

## 2012-11-28 ENCOUNTER — Other Ambulatory Visit: Payer: Self-pay

## 2012-11-28 MED ORDER — ERGOCALCIFEROL 1.25 MG (50000 UT) PO CAPS: 50000.0000 [IU] | ORAL_CAPSULE | ORAL | Status: DC

## 2012-11-28 NOTE — Addendum Note (Signed)
Addended by: Abner Greenspan on: 11/28/2012 02:36 PM   Modules accepted: Orders

## 2012-12-01 ENCOUNTER — Encounter: Payer: Self-pay | Admitting: Family Medicine

## 2012-12-01 LAB — PTH, INTACT AND CALCIUM: Calcium: 10.3 mg/dL (ref 8.4–10.5)

## 2012-12-18 ENCOUNTER — Encounter: Payer: Self-pay | Admitting: Family Medicine

## 2012-12-18 ENCOUNTER — Ambulatory Visit (INDEPENDENT_AMBULATORY_CARE_PROVIDER_SITE_OTHER): Payer: BC Managed Care – PPO | Admitting: Family Medicine

## 2012-12-18 ENCOUNTER — Other Ambulatory Visit: Payer: Self-pay

## 2012-12-18 VITALS — BP 160/90 | HR 75 | Resp 16 | Ht 62.5 in | Wt 121.0 lb

## 2012-12-18 DIAGNOSIS — I10 Essential (primary) hypertension: Secondary | ICD-10-CM

## 2012-12-18 DIAGNOSIS — F329 Major depressive disorder, single episode, unspecified: Secondary | ICD-10-CM

## 2012-12-18 DIAGNOSIS — E785 Hyperlipidemia, unspecified: Secondary | ICD-10-CM

## 2012-12-18 DIAGNOSIS — F32A Depression, unspecified: Secondary | ICD-10-CM

## 2012-12-18 DIAGNOSIS — Z23 Encounter for immunization: Secondary | ICD-10-CM

## 2012-12-18 DIAGNOSIS — IMO0002 Reserved for concepts with insufficient information to code with codable children: Secondary | ICD-10-CM

## 2012-12-18 MED ORDER — HYDROCHLOROTHIAZIDE 25 MG PO TABS: 25.0000 mg | ORAL_TABLET | Freq: Every day | ORAL | Status: DC

## 2012-12-18 MED ORDER — MIRTAZAPINE 7.5 MG PO TABS: 7.5000 mg | ORAL_TABLET | Freq: Every day | ORAL | Status: DC

## 2012-12-18 NOTE — Progress Notes (Signed)
  Subjective:    Patient ID: Christina Miles, female    DOB: [birth year], 67 y.o.   MRN: [id number]  HPI The PT is here for follow up and re-evaluation of chronic medical conditions, medication management and review of any available recent lab and radiology data.  Preventive health is updated, specifically  Cancer screening and Immunization.   . The PT denies any adverse reactions to current medications since the last visit. Has tolerated the remeron very well, sleep , apetite and energy all markedly improved, no adverse s/e 3 week h/o left lower extremity pain with tingling and numbness after picking vegetables for a while. She has established disc disease and has had back surgery over 20 years ago. No incontinence of stool or urine, no weakness in leg    Review of Systems See HPI Denies recent fever or chills. Denies sinus pressure, nasal congestion, ear pain or sore throat. Denies chest congestion, productive cough or wheezing. Denies chest pains, palpitations and leg swelling Denies abdominal pain, nausea, vomiting,diarrhea or constipation.   Denies dysuria, frequency, hesitancy or incontinence.  Denies headaches, seizures, numbness, or tingling.  Denies skin break down or rash.        Objective:   Physical Exam  Patient alert and oriented and in no cardiopulmonary distress.  HEENT: No facial asymmetry, EOMI, no sinus tenderness,  oropharynx pink and moist.  Neck supple no adenopathy.  Chest: Clear to auscultation bilaterally.  CVS: S1, S2 no murmurs, no S3.  ABD: Soft non tender. Bowel sounds normal.  Ext: No edema  MS: Adequate ROM spine, shoulders, hips and knees.  Skin: Intact, no ulcerations or rash noted.  Psych: Good eye contact, normal affect. Memory intact not anxious or depressed appearing.  CNS: CN 2-12 intact, power, tone and sensation normal throughout.       Assessment & Plan:

## 2012-12-18 NOTE — Patient Instructions (Addendum)
F/u in 6 weeks , pls call if you need me before  YOu have improved 200%, continue remeron as before  Flu and pneumonia vaccines today  Left lower extremity pain and tingling are due to back disease, care in bending, at next visit you will get anti inflammatory treatment for this  Blood pressure is high, INCREASE HCTZ 12.5 mg capsules to TWO every day till done, start today. New script sent to pharmacy when you fill , check to be sure please, is HCTZ 25 mg ONE daily  Continue the potassium as you are taking , and we will provide a list of potassium rich foods also When you finish the potassium tablets that you have, since they are big, oK to switch to OTC potassium 99mg  tablets 3 daily  Chem 7 non fast before next visit  Eat a lot of fruit and vegetable, and cut back on salt and canned foods  To help your blood pressure please. Also commit to daily physical activity for 30 minutes  HAPPY 67!

## 2012-12-18 NOTE — Assessment & Plan Note (Signed)
Uncontrolled, ioncrease HCTZ to 25 mg daily DASH diet and commitment to daily physical activity for a minimum of 30 minutes discussed and encouraged, as a part of hypertension management. The importance of attaining a healthy weight is also discussed.

## 2012-12-18 NOTE — Assessment & Plan Note (Signed)
Marked improvement, continue remeron

## 2012-12-18 NOTE — Assessment & Plan Note (Signed)
3 week flare, will hold on anti inflammatory  treatment today based on elevated blood pressure, pain at a 4. Pt agreeable to this

## 2012-12-18 NOTE — Addendum Note (Signed)
Addended by: Abner Greenspan on: 12/18/2012 12:52 PM   Modules accepted: Orders

## 2012-12-18 NOTE — Assessment & Plan Note (Signed)
Hyperlipidemia:Low fat diet discussed and encouraged. `Updated lab in 2015 

## 2013-01-27 LAB — BASIC METABOLIC PANEL
CO2: 28 mEq/L (ref 19–32)
Glucose, Bld: 59 mg/dL — ABNORMAL LOW (ref 70–99)
Potassium: 4.3 mEq/L (ref 3.5–5.3)
Sodium: 139 mEq/L (ref 135–145)

## 2013-01-29 ENCOUNTER — Ambulatory Visit (INDEPENDENT_AMBULATORY_CARE_PROVIDER_SITE_OTHER): Payer: BC Managed Care – PPO | Admitting: Family Medicine

## 2013-01-29 ENCOUNTER — Encounter: Payer: Self-pay | Admitting: Family Medicine

## 2013-01-29 VITALS — BP 134/82 | HR 80 | Resp 16 | Ht 62.0 in | Wt 126.0 lb

## 2013-01-29 DIAGNOSIS — E785 Hyperlipidemia, unspecified: Secondary | ICD-10-CM

## 2013-01-29 DIAGNOSIS — F3289 Other specified depressive episodes: Secondary | ICD-10-CM

## 2013-01-29 DIAGNOSIS — Z1321 Encounter for screening for nutritional disorder: Secondary | ICD-10-CM

## 2013-01-29 DIAGNOSIS — F32A Depression, unspecified: Secondary | ICD-10-CM

## 2013-01-29 DIAGNOSIS — I10 Essential (primary) hypertension: Secondary | ICD-10-CM

## 2013-01-29 DIAGNOSIS — F329 Major depressive disorder, single episode, unspecified: Secondary | ICD-10-CM

## 2013-01-29 DIAGNOSIS — E559 Vitamin D deficiency, unspecified: Secondary | ICD-10-CM

## 2013-01-29 DIAGNOSIS — Z130 Encounter for screening for diseases of the blood and blood-forming organs and certain disorders involving the immune mechanism: Secondary | ICD-10-CM

## 2013-01-29 NOTE — Progress Notes (Signed)
  Subjective:    Patient ID: Christina Miles, female    DOB: 08/30/1945, 67 y.o.   MRN: [id number]  HPI The PT is here for follow up and re-evaluation of chronic medical conditions, medication management and review of any available recent lab and radiology data.  Preventive health is updated, specifically  Cancer screening and Immunization.   Feels much better now that she is on medication for depression The PT denies any adverse reactions to current medications since the last visit.  There are no new concerns.  There are no specific complaints       Review of Systems See HPI Denies recent fever or chills. Denies sinus pressure, nasal congestion, ear pain or sore throat. Denies chest congestion, productive cough or wheezing. Denies chest pains, palpitations and leg swelling Denies abdominal pain, nausea, vomiting,diarrhea or constipation.   Denies dysuria, frequency, hesitancy or incontinence. Denies joint pain, swelling and limitation in mobility. Denies headaches, seizures, numbness, or tingling. Denies depression, anxiety or insomnia. Denies skin break down or rash.          Objective:   Physical Exam  Patient alert and oriented and in no cardiopulmonary distress.  HEENT: No facial asymmetry, EOMI, no sinus tenderness,  oropharynx pink and moist.  Neck supple no adenopathy.  Chest: Clear to auscultation bilaterally.  CVS: S1, S2 no murmurs, no S3.  ABD: Soft non tender. Bowel sounds normal.  Ext: No edema  MS: Adequate ROM spine, shoulders, hips and knees.  Skin: Intact, no ulcerations or rash noted.  Psych: Good eye contact, normal affect. Memory intact not anxious or depressed appearing.  CNS: CN 2-12 intact, power, tone and sensation normal throughout.       Assessment & Plan:

## 2013-01-29 NOTE — Patient Instructions (Addendum)
F/u in 4.5 month, call if you need me before please  Blood pressure is excellent , continue current medication  Please commit to 30 minutes of exercise every day for health   Please reduce fried and fatty food intake, cholesterol is high  Vit D, fasing lipid and chem 7 in 4.5 month.  Remember your cholesterol is high and vitamin D is low, take calcium with vit D two every day please

## 2013-01-31 DIAGNOSIS — E559 Vitamin D deficiency, unspecified: Secondary | ICD-10-CM | POA: Insufficient documentation

## 2013-01-31 NOTE — Assessment & Plan Note (Signed)
Controlled, no change in medication DASH diet and commitment to daily physical activity for a minimum of 30 minutes discussed and encouraged, as a part of hypertension management. The importance of attaining a healthy weight is also discussed.  

## 2013-01-31 NOTE — Assessment & Plan Note (Signed)
Pt to use calcium with D daily

## 2013-01-31 NOTE — Assessment & Plan Note (Signed)
Hyperlipidemia:Low fat diet discussed and encouraged.  Uncontrolled, pt resists medication

## 2013-01-31 NOTE — Assessment & Plan Note (Signed)
Marked improvement Continue current medication

## 2013-02-07 ENCOUNTER — Other Ambulatory Visit: Payer: Self-pay | Admitting: Family Medicine

## 2013-03-19 DIAGNOSIS — H547 Unspecified visual loss: Secondary | ICD-10-CM

## 2013-03-19 HISTORY — DX: Unspecified visual loss: H54.7

## 2013-04-15 ENCOUNTER — Other Ambulatory Visit: Payer: Self-pay | Admitting: Family Medicine

## 2013-05-15 ENCOUNTER — Other Ambulatory Visit: Payer: Self-pay

## 2013-05-15 DIAGNOSIS — F329 Major depressive disorder, single episode, unspecified: Secondary | ICD-10-CM

## 2013-05-15 DIAGNOSIS — F32A Depression, unspecified: Secondary | ICD-10-CM

## 2013-05-15 MED ORDER — MIRTAZAPINE 7.5 MG PO TABS: 7.5000 mg | ORAL_TABLET | Freq: Every day | ORAL | Status: DC

## 2013-05-15 MED ORDER — HYDROCHLOROTHIAZIDE 25 MG PO TABS: ORAL_TABLET | ORAL | Status: DC

## 2013-05-27 LAB — BASIC METABOLIC PANEL
BUN: 15 mg/dL (ref 6–23)
CO2: 32 mEq/L (ref 19–32)
Calcium: 10.3 mg/dL (ref 8.4–10.5)
Chloride: 102 mEq/L (ref 96–112)
Creat: 0.82 mg/dL (ref 0.50–1.10)
Glucose, Bld: 89 mg/dL (ref 70–99)
Potassium: 4.6 mEq/L (ref 3.5–5.3)
Sodium: 139 mEq/L (ref 135–145)

## 2013-05-27 LAB — LIPID PANEL
Cholesterol: 203 mg/dL — ABNORMAL HIGH (ref 0–200)
HDL: 78 mg/dL (ref >39)
LDL Cholesterol: 110 mg/dL — ABNORMAL HIGH (ref 0–99)
Total CHOL/HDL Ratio: 2.6 Ratio
Triglycerides: 77 mg/dL (ref <150)
VLDL: 15 mg/dL (ref 0–40)

## 2013-05-27 LAB — VITAMIN D 25 HYDROXY (VIT D DEFICIENCY, FRACTURES): Vit D, 25-Hydroxy: 57 ng/mL (ref 30–89)

## 2013-06-11 ENCOUNTER — Encounter: Payer: Self-pay | Admitting: Family Medicine

## 2013-06-11 ENCOUNTER — Ambulatory Visit (INDEPENDENT_AMBULATORY_CARE_PROVIDER_SITE_OTHER): Payer: Medicare Other | Admitting: Family Medicine

## 2013-06-11 VITALS — BP 138/82 | HR 85 | Resp 16 | Ht 62.0 in | Wt 133.1 lb

## 2013-06-11 DIAGNOSIS — F32A Depression, unspecified: Secondary | ICD-10-CM

## 2013-06-11 DIAGNOSIS — F329 Major depressive disorder, single episode, unspecified: Secondary | ICD-10-CM

## 2013-06-11 DIAGNOSIS — F3289 Other specified depressive episodes: Secondary | ICD-10-CM

## 2013-06-11 DIAGNOSIS — I10 Essential (primary) hypertension: Secondary | ICD-10-CM

## 2013-06-11 DIAGNOSIS — Z13820 Encounter for screening for osteoporosis: Secondary | ICD-10-CM

## 2013-06-11 DIAGNOSIS — N3090 Cystitis, unspecified without hematuria: Secondary | ICD-10-CM

## 2013-06-11 DIAGNOSIS — E785 Hyperlipidemia, unspecified: Secondary | ICD-10-CM

## 2013-06-11 LAB — POCT URINALYSIS DIPSTICK
Bilirubin, UA: NEGATIVE
GLUCOSE UA: NEGATIVE
KETONES UA: NEGATIVE
LEUKOCYTES UA: NEGATIVE
Nitrite, UA: NEGATIVE
Protein, UA: NEGATIVE
SPEC GRAV UA: 1.025
Urobilinogen, UA: 0.2
pH, UA: 6

## 2013-06-11 NOTE — Progress Notes (Signed)
   Subjective:    Patient ID: Christina Miles, female    DOB: 20-Nov-1945, 68 y.o.   MRN: [id number]  HPI The PT is here for follow up and re-evaluation of chronic medical conditions, medication management and review of any available recent lab and radiology data.  Preventive health is updated, specifically  Cancer screening and Immunization.    The PT denies any adverse reactions to current medications since the last visit.  C/o severe cramping lower abdominal pain last week, associated with dysuria and frequency, cranberry juice has provided a lot of relief though she still has symptoms      Review of Systems See HPI Denies recent fever or chills. Denies sinus pressure, nasal congestion, ear pain or sore throat. Denies chest congestion, productive cough or wheezing. Denies chest pains, palpitations and leg swelling Denies nausea, vomiting,diarrhea or constipation.   Denies joint pain, swelling and limitation in mobility. Denies headaches, seizures, numbness, or tingling. Denies depression, anxiety or insomnia. Denies skin break down or rash.        Objective:   Physical Exam BP 138/82  Pulse 85  Resp 16  Ht 5\' 2"  (1.575 m)  Wt 133 lb 1.9 oz (60.383 kg)  BMI 24.34 kg/m2  SpO2 99% Patient alert and oriented and in no cardiopulmonary distress.  HEENT: No facial asymmetry, EOMI, no sinus tenderness,  oropharynx pink and moist.  Neck supple no adenopathy.  Chest: Clear to auscultation bilaterally.  CVS: S1, S2 no murmurs, no S3.  ABD: Soft non tender. Bowel sounds normal.  Ext: No edema  MS: Adequate ROM spine, shoulders, hips and knees.  Skin: Intact, no ulcerations or rash noted.  Psych: Good eye contact, normal affect. Memory intact not anxious or depressed appearing.  CNS: CN 2-12 intact, power, tone and sensation normal throughout.        Assessment & Plan:  HYPERTENSION Controlled, no change in medication DASH diet and commitment to daily physical  activity for a minimum of 30 minutes discussed and encouraged, as a part of hypertension management. The importance of attaining a healthy weight is also discussed.   Hyperlipidemia Uncontrolled, but improved Hyperlipidemia:Low fat diet discussed and encouraged.  No medication at this time   Depression Excellent response to medication continue same  Cystitis Mildly symptomatic currently, was very symptomatic 1 week ago, symptoms eased with cranberry juice, UA today is negative  \

## 2013-06-11 NOTE — Patient Instructions (Addendum)
F/u in 4 month, call if you need me before  Please change eating habits, increase fruit and vegetable, only drink water, reduce fried and faty foods.  Your cholesterol is much better  Blood pressure is on the higher side of normal, so please commit to daily exercise and change in diet   Your mental health is great   Your urine is being checked for infection.  You will be referred for a bone density test  Fall Prevention and Home Safety Falls cause injuries and can affect all age groups. It is possible to prevent falls.  HOW TO PREVENT FALLS  Wear shoes with rubber soles that do not have an opening for your toes.  Keep the inside and outside of your house well lit.  Use night lights throughout your home.  Remove clutter from floors.  Clean up floor spills.  Remove throw rugs or fasten them to the floor with carpet tape.  Do not place electrical cords across pathways.  Put grab bars by your tub, shower, and toilet. Do not use towel bars as grab bars.  Put handrails on both sides of the stairway. Fix loose handrails.  Do not climb on stools or stepladders, if possible.  Do not wax your floors.  Repair uneven or unsafe sidewalks, walkways, or stairs.  Keep items you use a lot within reach.  Be aware of pets.  Keep emergency numbers next to the telephone.  Put smoke detectors in your home and near bedrooms. Ask your doctor what other things you can do to prevent falls. Document Released: 12/30/2008 Document Revised: 09/04/2011 Document Reviewed: 06/05/2011 [facility] Patient Information 2014 [place], [place].

## 2013-06-14 DIAGNOSIS — N3090 Cystitis, unspecified without hematuria: Secondary | ICD-10-CM | POA: Insufficient documentation

## 2013-06-14 NOTE — Assessment & Plan Note (Signed)
Excellent response to medication continue same

## 2013-06-14 NOTE — Assessment & Plan Note (Addendum)
Uncontrolled, but improved Hyperlipidemia:Low fat diet discussed and encouraged.  No medication at this time

## 2013-06-14 NOTE — Assessment & Plan Note (Signed)
Controlled, no change in medication DASH diet and commitment to daily physical activity for a minimum of 30 minutes discussed and encouraged, as a part of hypertension management. The importance of attaining a healthy weight is also discussed.  

## 2013-06-14 NOTE — Assessment & Plan Note (Signed)
Mildly symptomatic currently, was very symptomatic 1 week ago, symptoms eased with cranberry juice, UA today is negative

## 2013-06-17 ENCOUNTER — Other Ambulatory Visit (HOSPITAL_COMMUNITY): Payer: BC Managed Care – PPO

## 2013-06-18 ENCOUNTER — Other Ambulatory Visit (HOSPITAL_COMMUNITY): Payer: BC Managed Care – PPO

## 2013-06-24 ENCOUNTER — Encounter: Payer: Self-pay | Admitting: Family Medicine

## 2013-06-24 ENCOUNTER — Ambulatory Visit (HOSPITAL_COMMUNITY)
Admission: RE | Admit: 2013-06-24 | Discharge: 2013-06-24 | Disposition: A | Discharge status: Home / Self Care | Payer: Medicare Other | Source: Ambulatory Visit | Attending: Family Medicine | Admitting: Family Medicine

## 2013-06-24 DIAGNOSIS — M8580 Other specified disorders of bone density and structure, unspecified site: Secondary | ICD-10-CM | POA: Insufficient documentation

## 2013-06-24 DIAGNOSIS — Z13820 Encounter for screening for osteoporosis: Secondary | ICD-10-CM | POA: Insufficient documentation

## 2013-07-10 ENCOUNTER — Ambulatory Visit (INDEPENDENT_AMBULATORY_CARE_PROVIDER_SITE_OTHER): Payer: Medicare Other | Admitting: Family Medicine

## 2013-07-10 VITALS — BP 164/86 | HR 100 | Temp 98.7°F | Resp 18 | Ht 62.0 in | Wt 131.1 lb

## 2013-07-10 DIAGNOSIS — R51 Headache: Secondary | ICD-10-CM

## 2013-07-10 DIAGNOSIS — J302 Other seasonal allergic rhinitis: Secondary | ICD-10-CM

## 2013-07-10 DIAGNOSIS — I10 Essential (primary) hypertension: Secondary | ICD-10-CM

## 2013-07-10 DIAGNOSIS — J309 Allergic rhinitis, unspecified: Secondary | ICD-10-CM

## 2013-07-10 DIAGNOSIS — F329 Major depressive disorder, single episode, unspecified: Secondary | ICD-10-CM

## 2013-07-10 DIAGNOSIS — F32A Depression, unspecified: Secondary | ICD-10-CM

## 2013-07-10 DIAGNOSIS — R0789 Other chest pain: Secondary | ICD-10-CM

## 2013-07-10 DIAGNOSIS — R42 Dizziness and giddiness: Secondary | ICD-10-CM | POA: Insufficient documentation

## 2013-07-10 DIAGNOSIS — J0120 Acute ethmoidal sinusitis, unspecified: Secondary | ICD-10-CM

## 2013-07-10 DIAGNOSIS — F3289 Other specified depressive episodes: Secondary | ICD-10-CM

## 2013-07-10 MED ORDER — AZITHROMYCIN 250 MG PO TABS: ORAL_TABLET | ORAL | Status: DC

## 2013-07-10 MED ORDER — PREDNISONE (PAK) 5 MG PO TABS: 5.0000 mg | ORAL_TABLET | ORAL | Status: DC

## 2013-07-10 MED ORDER — RANITIDINE HCL 150 MG PO CAPS: 150.0000 mg | ORAL_CAPSULE | Freq: Two times a day (BID) | ORAL | Status: DC

## 2013-07-10 MED ORDER — PROMETHAZINE-DM 6.25-15 MG/5ML PO SYRP: ORAL_SOLUTION | ORAL | Status: DC

## 2013-07-10 MED ORDER — METHYLPREDNISOLONE ACETATE 80 MG/ML IJ SUSP
80.0000 mg | Freq: Once | INTRAMUSCULAR | Status: AC
  Administered 2013-07-10: 80 mg via INTRAMUSCULAR

## 2013-07-10 MED ORDER — MECLIZINE HCL 12.5 MG PO TABS: 12.5000 mg | ORAL_TABLET | Freq: Two times a day (BID) | ORAL | Status: DC | PRN

## 2013-07-10 MED ORDER — KETOROLAC TROMETHAMINE 60 MG/2ML IM SOLN
60.0000 mg | Freq: Once | INTRAMUSCULAR | Status: AC
Start: 2013-07-10 — End: 2013-07-10
  Administered 2013-07-10: 60 mg via INTRAMUSCULAR

## 2013-07-10 NOTE — Assessment & Plan Note (Signed)
Uncontrolled and elevated today, recheck in 4 weeks

## 2013-07-10 NOTE — Progress Notes (Signed)
   Subjective:    Patient ID: Christina Miles, female    DOB: [birth year], 68 y.o.   MRN: [id number]  HPI 4 day h/o increasing symptoms of head pressure with excessive cough, mainly dry but at times productive of sputum Experiencing substernal and left sided chest pain which is worsened by direct pressure in the area, intermittently  Over this time, at times pain is associated with the cough C/o light headedness, and poor apetite, slight dizziness, has  Been using a lot of OTC decongestant meds which is the likely cause of her elevated blood pressure   Review of Systems    See HPI Denies recent fever has had  chills. Denies  nasal congestion, ear pain or sore throat.increased PND with excessive nocturnal cough Denies chest congestion, productive cough or wheezing. Denies PND, orthopnea, palpitations and leg swelling Denies abdominal pain, nausea, vomiting,diarrhea or constipation.   Denies dysuria, frequency, hesitancy or incontinence. Denies joint pain, swelling and limitation in mobility. Denies headaches, seizures, numbness, or tingling. Denies depression,  Does have increased anxiety and  Insomnia.with her acute illness Denies skin break down or rash.   \ Objective:   Physical Exam BP 164/86  Pulse 100  Temp(Src) 98.7 F (37.1 C)  Resp 18  Ht 5\' 2"  (1.575 m)  Wt 131 lb 1.9 oz (59.476 kg)  BMI 23.98 kg/m2  SpO2 100% Patient alert and oriented and in no cardiopulmonary distress.Pt is anxious and acutely  ill appearing  HEENT: No facial asymmetry, EOMI, ethmoid  sinus tenderness,  oropharynx pink and moist.  Neck supple no adenopathy.No JVD  Chest: Clear to auscultation bilaterally.Tender over 4th and 5th left CC junctions  CVS: S1, S2 no murmurs, no S3.  ABD: Soft non tender. Bowel sounds normal.No organomegaly  Ext: No edema  MS: Adequate ROM spine, shoulders, hips and knees.  Skin: Intact, no ulcerations or rash noted.  Psych: Good eye contact, normal affect.  Memory intact  anxious not  depressed appearing.  CNS: CN 2-12 intact, power, tone and sensation normal throughout.        Assessment & Plan:  HYPERTENSION Uncontrolled and elevated today, recheck in 4 weeks  Seasonal allergies Uncontrolled, depo medrol and prednisone dose pack  Acute ethmoidal sinusitis z pack and cough suppressant medication, also short course of zantac due to epigastric and substernal pain and high dose of anti inflammatories  Vertigo Mild at this time, pt given script for antivert, should it worsen  Chest pain, atypical New substernal and left chest pain, low suspiscion for cardiac etiology. eKG in office today NSR , no ischemia  Depression Stable on medication and controlled, no change  Headache(784.0) No focal neurologic deficits, likely due to sinusitis , excessive cough and poor sleep, toradol and depo medrol in office

## 2013-07-10 NOTE — Patient Instructions (Addendum)
F/u in 4 weeks , call if you need me before  You are treated for uncontrolled allergies, acute sinusitis , chest wall pain EKG in office today for chest pain is NORMAL, no evidence of heart damage, the chest pain is from excessive cough as well as inflammation of the chest wall  Two injections today in the office for chest wall pain and allergies and headache  Medication sent to pharmacy, prednisone, zantac, azithromycin, and phenergan DM. You will be given the script for meclizine, fill only iif you become excessively dizzy  Please call early next week if no better

## 2013-07-11 ENCOUNTER — Encounter: Payer: Self-pay | Admitting: Family Medicine

## 2013-07-11 DIAGNOSIS — R519 Headache, unspecified: Secondary | ICD-10-CM | POA: Insufficient documentation

## 2013-07-11 DIAGNOSIS — R51 Headache: Secondary | ICD-10-CM | POA: Insufficient documentation

## 2013-07-11 NOTE — Assessment & Plan Note (Signed)
Stable on medication and controlled, no change

## 2013-07-11 NOTE — Assessment & Plan Note (Signed)
z pack and cough suppressant medication, also short course of zantac due to epigastric and substernal pain and high dose of anti inflammatories

## 2013-07-11 NOTE — Assessment & Plan Note (Signed)
Uncontrolled, depo medrol and prednisone dose pack 

## 2013-07-11 NOTE — Assessment & Plan Note (Signed)
Mild at this time, pt given script for antivert, should it worsen

## 2013-07-11 NOTE — Assessment & Plan Note (Signed)
New substernal and left chest pain, low suspiscion for cardiac etiology. eKG in office today NSR , no ischemia

## 2013-07-11 NOTE — Assessment & Plan Note (Signed)
No focal neurologic deficits, likely due to sinusitis , excessive cough and poor sleep, toradol and depo medrol in office

## 2013-08-03 ENCOUNTER — Ambulatory Visit: Payer: Medicare Other | Admitting: Family Medicine

## 2013-08-04 ENCOUNTER — Encounter: Payer: Self-pay | Admitting: Family Medicine

## 2013-10-13 ENCOUNTER — Ambulatory Visit: Payer: BC Managed Care – PPO | Admitting: Family Medicine

## 2013-10-28 ENCOUNTER — Ambulatory Visit (INDEPENDENT_AMBULATORY_CARE_PROVIDER_SITE_OTHER): Payer: Medicare Other | Admitting: Family Medicine

## 2013-10-28 ENCOUNTER — Encounter: Payer: Self-pay | Admitting: Family Medicine

## 2013-10-28 VITALS — BP 130/82 | HR 68 | Resp 18 | Ht 62.0 in | Wt 133.0 lb

## 2013-10-28 DIAGNOSIS — I10 Essential (primary) hypertension: Secondary | ICD-10-CM

## 2013-10-28 DIAGNOSIS — F329 Major depressive disorder, single episode, unspecified: Secondary | ICD-10-CM

## 2013-10-28 DIAGNOSIS — F32A Depression, unspecified: Secondary | ICD-10-CM

## 2013-10-28 DIAGNOSIS — Z23 Encounter for immunization: Secondary | ICD-10-CM

## 2013-10-28 DIAGNOSIS — Z0000 Encounter for general adult medical examination without abnormal findings: Secondary | ICD-10-CM | POA: Insufficient documentation

## 2013-10-28 DIAGNOSIS — IMO0002 Reserved for concepts with insufficient information to code with codable children: Secondary | ICD-10-CM

## 2013-10-28 DIAGNOSIS — E785 Hyperlipidemia, unspecified: Secondary | ICD-10-CM

## 2013-10-28 MED ORDER — HYDROCHLOROTHIAZIDE 25 MG PO TABS: ORAL_TABLET | ORAL | Status: DC

## 2013-10-28 MED ORDER — MIRTAZAPINE 7.5 MG PO TABS: 7.5000 mg | ORAL_TABLET | Freq: Every day | ORAL | Status: DC

## 2013-10-28 MED ORDER — TIZANIDINE HCL 2 MG PO TABS: ORAL_TABLET | ORAL | Status: DC

## 2013-10-28 NOTE — Patient Instructions (Addendum)
Annual physical exam mid January, call if you need me ebfore  Prevnar today  Call for flu vaccine in September  New for back pain when needed, is  zannaflex , one at bedtime  Fasting lipid, chem 7, cBc, tSH in January

## 2013-10-28 NOTE — Progress Notes (Signed)
Subjective:    Patient ID: Christina Miles, female    DOB: 01/31/1946, 68 y.o.   MRN: [id number]  HPI Preventive Screening-Counseling & Management   Patient present here today for a  Welcome to Medicare wellness visit.   Current Problems (verified)   Medications Prior to Visit Allergies (verified)   PAST HISTORY  Family History  Social History Married Mom of 2 adult children. Never  alcohol or drug use. 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 No  Over the past two weeks, have you felt little interest or pleasure in doing things? No  Have you lost interest or pleasure in daily life? No  Do you often feel hopeless? No  Do you cry easily over simple problems? No   Activities of Daily Living  In your present state of health, do you have any difficulty performing the following activities?  Driving?: No Managing money?: No Feeding yourself?:No Getting from bed to chair?:No Climbing a flight of stairs?:No Preparing food and eating?:No Bathing or showering?:No Getting dressed?:No Getting to the toilet?:No Using the toilet?:No Moving around from place to place?: No  Fall Risk Assessment In the past year have you fallen or had a near fall?:No Are you currently taking any medications that make you dizzy?:No   Hearing Difficulties: No Do you often ask people to speak up or repeat themselves?:No Do you experience ringing or noises in your ears?:No Do you have difficulty understanding soft or whispered  voices?:No  Cognitive Testing  Alert? Yes Normal Appearance?Yes  Oriented to person? Yes Place? Yes  Time? Yes  Displays appropriate judgment?Yes  Can read the correct time from a watch face? yes Are you having problems remembering things?No  Advanced Directives have been discussed with the patient?Yes , full code , needs to formalize   List the Names of Other Physician/Practitioners you currently use: none   Indicate any recent Medical Services you may have received from other than [facility] providers in the past year (date may be approximate).   Assessment:    Annual Wellness Exam   Plan:    [place]  I have personally reviewed:  The patient's medical and social history  Their use of alcohol, tobacco or illicit drugs  Their current medications and supplements  The patient's functional ability including ADLs,fall risks, home safety risks, cognitive, and hearing and visual impairment  Diet and physical activities  Evidence for depression or mood disorders  The patient's weight, height, BMI, and visual acuity have been recorded in the chart. I have made referrals, counseling, and provided education to the patient based on review of the above and I have provided the patient with a written personalized care plan for preventive services.      Review of Systems     Objective:   Physical Exam        Assessment & Plan:  Welcome to Medicare preventive visit Annual exam as documented. Counseling done  re healthy lifestyle involving commitment to 150 minutes exercise per week, heart healthy  diet, and attaining healthy weight.The importance of adequate sleep also discussed. Regular seat belt use and safe storage  of firearms if patient has them, is also discussed. Changes in health habits are decided on by the patient with goals and time frames  set for achieving them. Immunization and cancer screening needs are specifically addressed at this visit.   Need for  vaccination with 13-polyvalent pneumococcal conjugate vaccine Vaccine administered at visit

## 2013-11-01 ENCOUNTER — Encounter: Payer: Self-pay | Admitting: Family Medicine

## 2013-11-01 NOTE — Assessment & Plan Note (Signed)
Vaccine administered at visit.  

## 2013-11-01 NOTE — Assessment & Plan Note (Signed)
Annual exam as documented. Counseling done  re healthy lifestyle involving commitment to 150 minutes exercise per week, heart healthy diet, and attaining healthy weight.The importance of adequate sleep also discussed. Regular seat belt use and safe storage  of firearms if patient has them, is also discussed. Changes in health habits are decided on by the patient with goals and time frames  set for achieving them. Immunization and cancer screening needs are specifically addressed at this visit.  

## 2013-12-25 ENCOUNTER — Other Ambulatory Visit: Payer: Self-pay | Admitting: Family Medicine

## 2013-12-25 DIAGNOSIS — Z1231 Encounter for screening mammogram for malignant neoplasm of breast: Secondary | ICD-10-CM

## 2014-01-01 ENCOUNTER — Ambulatory Visit (HOSPITAL_COMMUNITY)
Admission: RE | Admit: 2014-01-01 | Discharge: 2014-01-01 | Disposition: A | Discharge status: Home / Self Care | Payer: Medicare Other | Source: Ambulatory Visit | Attending: Family Medicine | Admitting: Family Medicine

## 2014-01-01 DIAGNOSIS — Z1231 Encounter for screening mammogram for malignant neoplasm of breast: Secondary | ICD-10-CM | POA: Diagnosis present

## 2014-01-22 ENCOUNTER — Other Ambulatory Visit: Payer: Self-pay | Admitting: Family Medicine

## 2014-01-26 ENCOUNTER — Ambulatory Visit: Payer: Medicare Other

## 2014-01-26 ENCOUNTER — Ambulatory Visit (INDEPENDENT_AMBULATORY_CARE_PROVIDER_SITE_OTHER): Payer: Medicare Other

## 2014-01-26 DIAGNOSIS — Z23 Encounter for immunization: Secondary | ICD-10-CM

## 2014-02-16 ENCOUNTER — Encounter: Payer: Self-pay | Admitting: Family Medicine

## 2014-02-16 ENCOUNTER — Ambulatory Visit (INDEPENDENT_AMBULATORY_CARE_PROVIDER_SITE_OTHER): Payer: Medicare Other | Admitting: Family Medicine

## 2014-02-16 VITALS — BP 122/82 | HR 76 | Resp 16 | Ht 62.0 in | Wt 140.8 lb

## 2014-02-16 DIAGNOSIS — I10 Essential (primary) hypertension: Secondary | ICD-10-CM

## 2014-02-16 DIAGNOSIS — F32A Depression, unspecified: Secondary | ICD-10-CM

## 2014-02-16 DIAGNOSIS — E785 Hyperlipidemia, unspecified: Secondary | ICD-10-CM

## 2014-02-16 DIAGNOSIS — F329 Major depressive disorder, single episode, unspecified: Secondary | ICD-10-CM

## 2014-02-16 DIAGNOSIS — Z1211 Encounter for screening for malignant neoplasm of colon: Secondary | ICD-10-CM | POA: Diagnosis not present

## 2014-02-16 DIAGNOSIS — J302 Other seasonal allergic rhinitis: Secondary | ICD-10-CM

## 2014-02-16 LAB — POC HEMOCCULT BLD/STL (OFFICE/1-CARD/DIAGNOSTIC): Fecal Occult Blood, POC: NEGATIVE

## 2014-02-16 MED ORDER — POTASSIUM CHLORIDE ER 10 MEQ PO TBCR: EXTENDED_RELEASE_TABLET | ORAL | Status: DC

## 2014-02-16 NOTE — Progress Notes (Signed)
   Subjective:    Patient ID: Christina Miles, female    DOB: 16-Nov-1945, 68 y.o.   MRN: [id number]  HPI The PT is here for follow up and re-evaluation of chronic medical conditions, medication management and review of any available recent lab and radiology data.  Preventive health is updated, specifically  Cancer screening and Immunization.   Seen yesterday at eye specialist for sudden right vision loss has been told she is bkleeding behind the eye and has a f/u appt withspecialist The PT denies any adverse reactions to current medications since the last visit.  There are no new concerns.  There are no specific complaints  Needs FOB, no h/o change in stool caliber, or visible bleeding      Review of Systems See HPI Denies recent fever or chills. Denies sinus pressure, nasal congestion, ear pain or sore throat. Denies chest congestion, productive cough or wheezing. Denies chest pains, palpitations and leg swelling Denies abdominal pain, nausea, vomiting,diarrhea or constipation.   Denies dysuria, frequency, hesitancy or incontinence. Denies joint pain, swelling and limitation in mobility. Denies headaches, seizures, numbness, or tingling. Denies depression, anxiety or insomnia. Denies skin break down or rash.        Objective:   Physical Exam BP 122/82 mmHg  Pulse 76  Resp 16  Ht 5\' 2"  (1.575 m)  Wt 140 lb 12.8 oz (63.866 kg)  BMI 25.75 kg/m2  SpO2 99% Patient alert and oriented and in no cardiopulmonary distress.  HEENT: No facial asymmetry, EOMI,   oropharynx pink and moist.  Neck supple no JVD, no mass.  Chest: Clear to auscultation bilaterally.  CVS: S1, S2 no murmurs, no S3.Regular rate.  ABD: Soft non tender. No organomegaly or mass. Normal BS Rectal: heme negative stool, no mass  Ext: No edema  MS: Adequate ROM spine, shoulders, hips and knees.  Skin: Intact, no ulcerations or rash noted.  Psych: Good eye contact, normal affect. Memory intact not  anxious or depressed appearing.  CNS: CN 2-12 intact, power,  normal throughout.no focal deficits noted.        Assessment & Plan:  Essential hypertension Controlled, no change in medication DASH diet and commitment to daily physical activity for a minimum of 30 minutes discussed and encouraged, as a part of hypertension management. The importance of attaining a healthy weight is also discussed.   Depression Controlled, no change in medication   Hyperlipidemia Improved, though still not at goal Hyperlipidemia:Low fat diet discussed and encouraged.    Special screening for malignant neoplasms, colon Most recent colonoscopy was in 2013. Rectal exam during visit is normal, as is documented  Seasonal allergies Controlled, no change in medication

## 2014-02-16 NOTE — Patient Instructions (Signed)
F/u as before  Rectal exam today for colon cancer screen is  Normal  Reduce candy and start exercising so that you lose weight please  Check with your ins co re coverage fopr TdAP you need this  All the best with eye and for the  New year

## 2014-02-23 ENCOUNTER — Encounter (INDEPENDENT_AMBULATORY_CARE_PROVIDER_SITE_OTHER): Payer: Medicare Other | Admitting: Ophthalmology

## 2014-02-23 DIAGNOSIS — H43813 Vitreous degeneration, bilateral: Secondary | ICD-10-CM

## 2014-02-23 DIAGNOSIS — H34831 Tributary (branch) retinal vein occlusion, right eye: Secondary | ICD-10-CM

## 2014-02-23 DIAGNOSIS — I10 Essential (primary) hypertension: Secondary | ICD-10-CM

## 2014-02-23 DIAGNOSIS — H35033 Hypertensive retinopathy, bilateral: Secondary | ICD-10-CM

## 2014-03-16 DIAGNOSIS — Z1211 Encounter for screening for malignant neoplasm of colon: Secondary | ICD-10-CM | POA: Insufficient documentation

## 2014-03-16 NOTE — Assessment & Plan Note (Signed)
Controlled, no change in medication DASH diet and commitment to daily physical activity for a minimum of 30 minutes discussed and encouraged, as a part of hypertension management. The importance of attaining a healthy weight is also discussed.  

## 2014-03-16 NOTE — Assessment & Plan Note (Signed)
Most recent colonoscopy was in 2013. Rectal exam during visit is normal, as is documented

## 2014-03-16 NOTE — Assessment & Plan Note (Signed)
Improved, though still not at goal Hyperlipidemia:Low fat diet discussed and encouraged.

## 2014-03-16 NOTE — Assessment & Plan Note (Signed)
Controlled, no change in medication  

## 2014-03-18 ENCOUNTER — Other Ambulatory Visit: Payer: Self-pay

## 2014-03-18 MED ORDER — POTASSIUM CHLORIDE ER 10 MEQ PO TBCR: EXTENDED_RELEASE_TABLET | ORAL | Status: DC

## 2014-03-23 ENCOUNTER — Encounter (INDEPENDENT_AMBULATORY_CARE_PROVIDER_SITE_OTHER): Payer: Medicare Other | Admitting: Ophthalmology

## 2014-03-23 DIAGNOSIS — H35033 Hypertensive retinopathy, bilateral: Secondary | ICD-10-CM | POA: Diagnosis not present

## 2014-03-23 DIAGNOSIS — H2513 Age-related nuclear cataract, bilateral: Secondary | ICD-10-CM

## 2014-03-23 DIAGNOSIS — H43813 Vitreous degeneration, bilateral: Secondary | ICD-10-CM

## 2014-03-23 DIAGNOSIS — H34831 Tributary (branch) retinal vein occlusion, right eye: Secondary | ICD-10-CM | POA: Diagnosis not present

## 2014-03-23 DIAGNOSIS — I10 Essential (primary) hypertension: Secondary | ICD-10-CM

## 2014-03-26 LAB — BASIC METABOLIC PANEL
BUN: 16 mg/dL (ref 6–23)
CHLORIDE: 99 meq/L (ref 96–112)
CO2: 30 mEq/L (ref 19–32)
CREATININE: 0.95 mg/dL (ref 0.50–1.10)
Calcium: 10 mg/dL (ref 8.4–10.5)
GLUCOSE: 85 mg/dL (ref 70–99)
POTASSIUM: 4.2 meq/L (ref 3.5–5.3)
Sodium: 138 mEq/L (ref 135–145)

## 2014-03-26 LAB — CBC
HEMATOCRIT: 40.9 % (ref 36.0–46.0)
Hemoglobin: 13.7 g/dL (ref 12.0–15.0)
MCH: 30.2 pg (ref 26.0–34.0)
MCHC: 33.5 g/dL (ref 30.0–36.0)
MCV: 90.1 fL (ref 78.0–100.0)
MPV: 9.9 fL (ref 8.6–12.4)
Platelets: 293 10*3/uL (ref 150–400)
RBC: 4.54 MIL/uL (ref 3.87–5.11)
RDW: 13.7 % (ref 11.5–15.5)
WBC: 6.3 10*3/uL (ref 4.0–10.5)

## 2014-03-26 LAB — LIPID PANEL
Cholesterol: 218 mg/dL — ABNORMAL HIGH (ref 0–200)
HDL: 76 mg/dL (ref >39)
LDL CALC: 127 mg/dL — AB (ref 0–99)
TRIGLYCERIDES: 76 mg/dL (ref <150)
Total CHOL/HDL Ratio: 2.9 Ratio
VLDL: 15 mg/dL (ref 0–40)

## 2014-03-26 LAB — TSH: TSH: 1.636 u[IU]/mL (ref 0.350–4.500)

## 2014-03-29 ENCOUNTER — Encounter: Payer: Self-pay | Admitting: Family Medicine

## 2014-03-29 ENCOUNTER — Ambulatory Visit (INDEPENDENT_AMBULATORY_CARE_PROVIDER_SITE_OTHER): Payer: BLUE CROSS/BLUE SHIELD | Admitting: Family Medicine

## 2014-03-29 VITALS — BP 138/80 | HR 73 | Resp 16 | Ht 62.0 in | Wt 139.0 lb

## 2014-03-29 DIAGNOSIS — Z1211 Encounter for screening for malignant neoplasm of colon: Secondary | ICD-10-CM

## 2014-03-29 DIAGNOSIS — E785 Hyperlipidemia, unspecified: Secondary | ICD-10-CM

## 2014-03-29 DIAGNOSIS — Z0000 Encounter for general adult medical examination without abnormal findings: Secondary | ICD-10-CM | POA: Insufficient documentation

## 2014-03-29 DIAGNOSIS — I10 Essential (primary) hypertension: Secondary | ICD-10-CM

## 2014-03-29 NOTE — Patient Instructions (Addendum)
F/u in 5.5 month, call if you need me before  Please commit o increasing fresh / frozen fruit and vegetable intake withiout the additives and 30 minutesd daily of physical activity for health reasons  Exam is very  good, but cholesterol has increased and you need to watch your weight also  Fasting lipid and chem 7 in 5.5 month  All the best for 2016!

## 2014-03-29 NOTE — Assessment & Plan Note (Signed)
Heme negative stool, no mass 

## 2014-03-29 NOTE — Assessment & Plan Note (Signed)

## 2014-03-29 NOTE — Progress Notes (Signed)
   Subjective:    Patient ID: Christina Miles, female    DOB: 1945/09/30, 69 y.o.   MRN: [id number]  HPI Patient is in for annual physical  exam. No other health concerns are expressed or addressed at the visit. Has had 2 treatments for retinal hemorhage with great success has another this week     Review of Systems See HPI     Objective:   Physical Exam  BP 138/80 mmHg  Pulse 73  Resp 16  Ht 5\' 2"  (1.575 m)  Wt 139 lb (63.05 kg)  BMI 25.42 kg/m2  SpO2 97% Pleasant well nourished female, alert and oriented x 3, in no cardio-pulmonary distress. Afebrile. HEENT No facial trauma or asymetry. Sinuses non tender.  Extra occullar muscles intact, pupils equally reactive to light. External ears normal, tympanic membranes clear. Oropharynx moist, no exudate, good dentition. Neck: supple, no adenopathy,JVD or thyromegaly.No bruits.  Chest: Clear to ascultation bilaterally.No crackles or wheezes. Non tender to palpation  Breast: No asymetry,no masses or lumps. No tenderness. No nipple discharge or inversion. No axillary or supraclavicular adenopathy  Cardiovascular system; Heart sounds normal,  S1 and  S2 ,no S3.  No murmur, or thrill. Apical beat not displaced Peripheral pulses normal.  Abdomen: Soft, non tender, no organomegaly or masses. No bruits. Bowel sounds normal. No guarding, tenderness or rebound.  Rectal:  Normal sphincter tone. No mass.No rectal masses.  FOB rechecked despite the fact that she had negative test in 02/2014, she has f/h of colon ca and personal h/o tubular  adenoma  GU: External genitalia normal female genitalia , female distribution of hair. No lesions. Urethral meatus normal in size, no  Prolapse, no lesions visibly  Present. Bladder non tender. Vagina pink and moist , with no visible lesions , discharge present . Adequate pelvic support no  cystocele or rectocele noted  Uterus absent, no adnexal masses, no  adnexal  tenderness.   Musculoskeletal exam: Full ROM of spine, hips , shoulders and knees. No deformity ,swelling or crepitus noted. No muscle wasting or atrophy.   Neurologic: Cranial nerves 2 to 12 intact. Power, tone ,sensation and reflexes normal throughout. No disturbance in gait. No tremor.  Skin: Intact, no ulceration, erythema , scaling or rash noted. Pigmentation normal throughout  Psych; Normal mood and affect. Judgement and concentration normal       Assessment & Plan:  Special screening for malignant neoplasms, colon Heme negative stool , no mass  Annual physical exam Annual exam as documented. Counseling done  re healthy lifestyle involving commitment to 150 minutes exercise per week, heart healthy diet, and attaining healthy weight.The importance of adequate sleep also discussed. Regular seat belt use and home safety, is also discussed. Changes in health habits are decided on by the patient with goals and time frames  set for achieving them. Immunization and cancer screening needs are specifically addressed at this visit.

## 2014-04-20 ENCOUNTER — Encounter (INDEPENDENT_AMBULATORY_CARE_PROVIDER_SITE_OTHER): Payer: Medicare Other | Admitting: Ophthalmology

## 2014-04-20 DIAGNOSIS — H35033 Hypertensive retinopathy, bilateral: Secondary | ICD-10-CM | POA: Diagnosis not present

## 2014-04-20 DIAGNOSIS — H43813 Vitreous degeneration, bilateral: Secondary | ICD-10-CM

## 2014-04-20 DIAGNOSIS — I10 Essential (primary) hypertension: Secondary | ICD-10-CM | POA: Diagnosis not present

## 2014-04-20 DIAGNOSIS — H34831 Tributary (branch) retinal vein occlusion, right eye: Secondary | ICD-10-CM | POA: Diagnosis not present

## 2014-04-20 DIAGNOSIS — H2513 Age-related nuclear cataract, bilateral: Secondary | ICD-10-CM

## 2014-05-20 ENCOUNTER — Encounter (INDEPENDENT_AMBULATORY_CARE_PROVIDER_SITE_OTHER): Payer: Medicare Other | Admitting: Ophthalmology

## 2014-05-20 DIAGNOSIS — H34831 Tributary (branch) retinal vein occlusion, right eye: Secondary | ICD-10-CM

## 2014-05-20 DIAGNOSIS — I10 Essential (primary) hypertension: Secondary | ICD-10-CM | POA: Diagnosis not present

## 2014-05-20 DIAGNOSIS — H43813 Vitreous degeneration, bilateral: Secondary | ICD-10-CM

## 2014-05-20 DIAGNOSIS — H2513 Age-related nuclear cataract, bilateral: Secondary | ICD-10-CM | POA: Diagnosis not present

## 2014-05-20 DIAGNOSIS — H35033 Hypertensive retinopathy, bilateral: Secondary | ICD-10-CM

## 2014-06-24 ENCOUNTER — Encounter (INDEPENDENT_AMBULATORY_CARE_PROVIDER_SITE_OTHER): Payer: Medicare Other | Admitting: Ophthalmology

## 2014-07-02 ENCOUNTER — Encounter (INDEPENDENT_AMBULATORY_CARE_PROVIDER_SITE_OTHER): Payer: Medicare Other | Admitting: Ophthalmology

## 2014-07-02 DIAGNOSIS — H35033 Hypertensive retinopathy, bilateral: Secondary | ICD-10-CM | POA: Diagnosis not present

## 2014-07-02 DIAGNOSIS — H34831 Tributary (branch) retinal vein occlusion, right eye: Secondary | ICD-10-CM | POA: Diagnosis not present

## 2014-07-02 DIAGNOSIS — H43813 Vitreous degeneration, bilateral: Secondary | ICD-10-CM

## 2014-07-02 DIAGNOSIS — I10 Essential (primary) hypertension: Secondary | ICD-10-CM | POA: Diagnosis not present

## 2014-07-29 ENCOUNTER — Other Ambulatory Visit: Payer: Self-pay | Admitting: Family Medicine

## 2014-08-19 ENCOUNTER — Encounter (INDEPENDENT_AMBULATORY_CARE_PROVIDER_SITE_OTHER): Payer: Medicare Other | Admitting: Ophthalmology

## 2014-08-19 DIAGNOSIS — H2513 Age-related nuclear cataract, bilateral: Secondary | ICD-10-CM

## 2014-08-19 DIAGNOSIS — H34831 Tributary (branch) retinal vein occlusion, right eye: Secondary | ICD-10-CM | POA: Diagnosis not present

## 2014-08-19 DIAGNOSIS — I10 Essential (primary) hypertension: Secondary | ICD-10-CM | POA: Diagnosis not present

## 2014-08-19 DIAGNOSIS — H35033 Hypertensive retinopathy, bilateral: Secondary | ICD-10-CM | POA: Diagnosis not present

## 2014-08-19 DIAGNOSIS — H43813 Vitreous degeneration, bilateral: Secondary | ICD-10-CM

## 2014-10-05 ENCOUNTER — Ambulatory Visit (INDEPENDENT_AMBULATORY_CARE_PROVIDER_SITE_OTHER): Payer: Medicare Other | Admitting: Family Medicine

## 2014-10-05 ENCOUNTER — Encounter: Payer: Self-pay | Admitting: Family Medicine

## 2014-10-05 VITALS — BP 124/82 | HR 77 | Resp 16 | Ht 62.0 in | Wt 135.8 lb

## 2014-10-05 DIAGNOSIS — Z23 Encounter for immunization: Secondary | ICD-10-CM | POA: Insufficient documentation

## 2014-10-05 DIAGNOSIS — M549 Dorsalgia, unspecified: Secondary | ICD-10-CM | POA: Diagnosis not present

## 2014-10-05 DIAGNOSIS — I10 Essential (primary) hypertension: Secondary | ICD-10-CM

## 2014-10-05 DIAGNOSIS — F329 Major depressive disorder, single episode, unspecified: Secondary | ICD-10-CM

## 2014-10-05 DIAGNOSIS — F32A Depression, unspecified: Secondary | ICD-10-CM

## 2014-10-05 DIAGNOSIS — J302 Other seasonal allergic rhinitis: Secondary | ICD-10-CM

## 2014-10-05 DIAGNOSIS — E785 Hyperlipidemia, unspecified: Secondary | ICD-10-CM

## 2014-10-05 MED ORDER — IBUPROFEN 600 MG PO TABS: ORAL_TABLET | ORAL | Status: DC

## 2014-10-05 MED ORDER — PREDNISONE 5 MG PO TABS: 5.0000 mg | ORAL_TABLET | Freq: Two times a day (BID) | ORAL | Status: AC

## 2014-10-05 NOTE — Progress Notes (Signed)
   BRYANT LIPPS     MRN: [id number]      DOB: 1946/02/08   HPI Ms. Gal is here for follow up and re-evaluation of chronic medical conditions, medication management and review of any available recent lab and radiology data.  Preventive health is updated, specifically  Cancer screening and Immunization.   Questions or concerns regarding consultations or procedures which the PT has had in the interim are  addressed. The PT denies any adverse reactions to current medications since the last visit.  3 month h/o lBP radiating to right buttock and thigh, worse with gardening activity. Denies weakness, numbness or incontinence  ROS Denies recent fever or chills. Denies sinus pressure, nasal congestion, ear pain or sore throat. Denies chest congestion, productive cough or wheezing. Denies chest pains, palpitations and leg swelling Denies abdominal pain, nausea, vomiting,diarrhea or constipation.   Denies dysuria, frequency, hesitancy or incontinence. Denies headaches, seizures, numbness, or tingling. Denies depression, anxiety or insomnia. Denies skin break down or rash.   PE  BP 124/82 mmHg  Pulse 77  Resp 16  Ht 5\' 2"  (1.575 m)  Wt 135 lb 12.8 oz (61.598 kg)  BMI 24.83 kg/m2  SpO2 100%  Patient alert and oriented and in no cardiopulmonary distress.  HEENT: No facial asymmetry, EOMI,   oropharynx pink and moist.  Neck supple no JVD, no mass.  Chest: Clear to auscultation bilaterally.  CVS: S1, S2 no murmurs, no S3.Regular rate.  ABD: Soft non tender.   Ext: No edema  MS: Adequate ROM spine, shoulders, hips and knees.  Skin: Intact, no ulcerations or rash noted.  Psych: Good eye contact, normal affect. Memory intact not anxious or depressed appearing.  CNS: CN 2-12 intact, power,  normal throughout.no focal deficits noted.   Assessment & Plan   Essential hypertension Controlled, no change in medication DASH diet and commitment to daily physical activity for a  minimum of 30 minutes discussed and encouraged, as a part of hypertension management. The importance of attaining a healthy weight is also discussed.  BP/Weight 10/05/2014 03/29/2014 02/16/2014 10/28/2013 07/10/2013 06/11/2013 48/03/6551  Systolic BP 748 270 786 754 492 010 071  Diastolic BP 82 80 82 82 86 82 82  Wt. (Lbs) 135.8 139 140.8 133.04 131.12 133.12 126  BMI 24.83 25.42 25.75 24.33 23.98 24.34 23.04        Back pain with radiation Acute flare x 3 month, with increased activity in the garden  Hyperlipidemia Hyperlipidemia:Low fat diet discussed and encouraged.   Lipid Panel  Lab Results  Component Value Date   CHOL 218* 03/26/2014   HDL 76 03/26/2014   LDLCALC 127* 03/26/2014   TRIG 76 03/26/2014   CHOLHDL 2.9 03/26/2014      Updated lab needed .   Depression Controlled, no change in medication   Need for Tdap vaccination After obtaining informed consent, the vaccine is  administered by LPN.   Seasonal allergies Intermittent symptoms, uses flonase as needed

## 2014-10-05 NOTE — Assessment & Plan Note (Signed)
Controlled, no change in medication DASH diet and commitment to daily physical activity for a minimum of 30 minutes discussed and encouraged, as a part of hypertension management. The importance of attaining a healthy weight is also discussed.  BP/Weight 10/05/2014 03/29/2014 02/16/2014 10/28/2013 07/10/2013 06/11/2013 53/20/2334  Systolic BP 356 861 683 729 021 115 520  Diastolic BP 82 80 82 82 86 82 82  Wt. (Lbs) 135.8 139 140.8 133.04 131.12 133.12 126  BMI 24.83 25.42 25.75 24.33 23.98 24.34 23.04

## 2014-10-05 NOTE — Assessment & Plan Note (Signed)
Acute flare x 3 month, with increased activity in the garden

## 2014-10-05 NOTE — Assessment & Plan Note (Signed)
After obtaining informed consent, the vaccine is  administered by LPN.  

## 2014-10-05 NOTE — Assessment & Plan Note (Signed)
Controlled, no change in medication  

## 2014-10-05 NOTE — Assessment & Plan Note (Signed)
Hyperlipidemia:Low fat diet discussed and encouraged.   Lipid Panel  Lab Results  Component Value Date   CHOL 218* 03/26/2014   HDL 76 03/26/2014   LDLCALC 127* 03/26/2014   TRIG 76 03/26/2014   CHOLHDL 2.9 03/26/2014      Updated lab needed .

## 2014-10-05 NOTE — Patient Instructions (Signed)
Annual wellness 2nd week in Dec with rectal  Call if you need me before  Flu vaccine in September, call pls  Mammogram due mid October, please schedule  Medication sent in for back pain, for short period, otherwise use tylenol 500mg  one to two daily and practice good back hygiene    TdAP today  Please work on good  health habits so that your health will improve. 1. Commitment to daily physical activity for 30 to 60  minutes, if you are able to do this.  2. Commitment to wise food choices. Aim for half of your  food intake to be vegetable and fruit, one quarter starchy foods, and one quarter protein. Try to eat on a regular schedule  3 meals per day, snacking between meals should be limited to vegetables or fruits or small portions of nuts. 64 ounces of water per day is generally recommended, unless you have specific health conditions, like heart failure or kidney failure where you will need to limit fluid intake.  3. Commitment to sufficient and a  good quality of physical and mental rest daily, generally between 6 to 8 hours per day.  WITH PERSISTANCE AND PERSEVERANCE, THE IMPOSSIBLE , BECOMES THE NORM! Thanks for choosing [facility], we consider it a privelige to serve you.

## 2014-10-05 NOTE — Assessment & Plan Note (Signed)
Intermittent symptoms, uses flonase as needed

## 2014-10-07 ENCOUNTER — Encounter (INDEPENDENT_AMBULATORY_CARE_PROVIDER_SITE_OTHER): Payer: Medicare Other | Admitting: Ophthalmology

## 2014-10-14 ENCOUNTER — Encounter (INDEPENDENT_AMBULATORY_CARE_PROVIDER_SITE_OTHER): Payer: Medicare Other | Admitting: Ophthalmology

## 2014-10-14 DIAGNOSIS — H34831 Tributary (branch) retinal vein occlusion, right eye: Secondary | ICD-10-CM | POA: Diagnosis not present

## 2014-10-14 DIAGNOSIS — H35033 Hypertensive retinopathy, bilateral: Secondary | ICD-10-CM

## 2014-10-14 DIAGNOSIS — I10 Essential (primary) hypertension: Secondary | ICD-10-CM

## 2014-10-14 DIAGNOSIS — H43813 Vitreous degeneration, bilateral: Secondary | ICD-10-CM

## 2014-12-23 ENCOUNTER — Encounter (INDEPENDENT_AMBULATORY_CARE_PROVIDER_SITE_OTHER): Payer: Medicare Other | Admitting: Ophthalmology

## 2014-12-23 DIAGNOSIS — H43813 Vitreous degeneration, bilateral: Secondary | ICD-10-CM | POA: Diagnosis not present

## 2014-12-23 DIAGNOSIS — H34831 Tributary (branch) retinal vein occlusion, right eye, with macular edema: Secondary | ICD-10-CM | POA: Diagnosis not present

## 2014-12-23 DIAGNOSIS — H35033 Hypertensive retinopathy, bilateral: Secondary | ICD-10-CM

## 2014-12-23 DIAGNOSIS — I10 Essential (primary) hypertension: Secondary | ICD-10-CM

## 2015-01-06 ENCOUNTER — Other Ambulatory Visit: Payer: Self-pay | Admitting: Family Medicine

## 2015-01-06 DIAGNOSIS — Z1231 Encounter for screening mammogram for malignant neoplasm of breast: Secondary | ICD-10-CM

## 2015-01-17 ENCOUNTER — Ambulatory Visit (HOSPITAL_COMMUNITY)
Admission: RE | Admit: 2015-01-17 | Discharge: 2015-01-17 | Disposition: A | Discharge status: Home / Self Care | Payer: Medicare Other | Source: Ambulatory Visit | Attending: Family Medicine | Admitting: Family Medicine

## 2015-01-17 DIAGNOSIS — Z1231 Encounter for screening mammogram for malignant neoplasm of breast: Secondary | ICD-10-CM

## 2015-02-02 ENCOUNTER — Other Ambulatory Visit: Payer: Self-pay | Admitting: Family Medicine

## 2015-02-03 ENCOUNTER — Other Ambulatory Visit: Payer: Self-pay | Admitting: Family Medicine

## 2015-02-13 ENCOUNTER — Other Ambulatory Visit: Payer: Self-pay | Admitting: Family Medicine

## 2015-03-10 ENCOUNTER — Encounter (INDEPENDENT_AMBULATORY_CARE_PROVIDER_SITE_OTHER): Payer: Medicare Other | Admitting: Ophthalmology

## 2015-03-10 DIAGNOSIS — H35033 Hypertensive retinopathy, bilateral: Secondary | ICD-10-CM | POA: Diagnosis not present

## 2015-03-10 DIAGNOSIS — H43813 Vitreous degeneration, bilateral: Secondary | ICD-10-CM | POA: Diagnosis not present

## 2015-03-10 DIAGNOSIS — H2513 Age-related nuclear cataract, bilateral: Secondary | ICD-10-CM | POA: Diagnosis not present

## 2015-03-10 DIAGNOSIS — H34831 Tributary (branch) retinal vein occlusion, right eye, with macular edema: Secondary | ICD-10-CM | POA: Diagnosis not present

## 2015-03-10 DIAGNOSIS — I10 Essential (primary) hypertension: Secondary | ICD-10-CM

## 2015-03-15 ENCOUNTER — Encounter: Payer: Self-pay | Admitting: Family Medicine

## 2015-03-15 ENCOUNTER — Ambulatory Visit (INDEPENDENT_AMBULATORY_CARE_PROVIDER_SITE_OTHER): Payer: Medicare Other | Admitting: Family Medicine

## 2015-03-15 VITALS — BP 130/78 | HR 73 | Resp 16 | Ht 62.0 in | Wt 143.0 lb

## 2015-03-15 DIAGNOSIS — Z1159 Encounter for screening for other viral diseases: Secondary | ICD-10-CM

## 2015-03-15 DIAGNOSIS — I10 Essential (primary) hypertension: Secondary | ICD-10-CM

## 2015-03-15 DIAGNOSIS — Z23 Encounter for immunization: Secondary | ICD-10-CM

## 2015-03-15 DIAGNOSIS — F32A Depression, unspecified: Secondary | ICD-10-CM

## 2015-03-15 DIAGNOSIS — Z0000 Encounter for general adult medical examination without abnormal findings: Secondary | ICD-10-CM | POA: Diagnosis not present

## 2015-03-15 DIAGNOSIS — E785 Hyperlipidemia, unspecified: Secondary | ICD-10-CM

## 2015-03-15 DIAGNOSIS — F329 Major depressive disorder, single episode, unspecified: Secondary | ICD-10-CM

## 2015-03-15 MED ORDER — MIRTAZAPINE 7.5 MG PO TABS: 7.5000 mg | ORAL_TABLET | Freq: Every day | ORAL | Status: DC

## 2015-03-15 NOTE — Progress Notes (Signed)
Subjective:    Patient ID: Christina Miles, female    DOB: [birth year], 69 y.o.   MRN: [id number]  HPI Preventive Screening-Counseling & Management   Patient present here today for a Medicare annual wellness visit.   Current Problems (verified)   Medications Prior to Visit Allergies (verified)   PAST HISTORY  Family History (verified)   Social History  Married for 12 years with 2 girls, retired 5 years ago. Former smoker   Risk Factors  Current exercise habits: walks a treadmill every am for 20 mins    Dietary issues discussed: heart healthy diet- eat lots of fruits and veg's and limits fried fatty foods    Cardiac risk factors: none significant Depression Screen  (Note: if answer to either of the following is "Yes", a more complete depression screening is indicated)   Over the past two weeks, have you felt down, depressed or hopeless? A little depressed and anxiety   Over the past two weeks, have you felt little interest or pleasure in doing things? No  Have you lost interest or pleasure in daily life? No  Do you often feel hopeless? No  Do you cry easily over simple problems? No   Activities of Daily Living  In your present state of health, do you have any difficulty performing the following activities?  Driving?: No Managing money?: No Feeding yourself?:No Getting from bed to chair?:No Climbing a flight of stairs?:No Preparing food and eating?:No Bathing or showering?:No Getting dressed?:No Getting to the toilet?:No Using the toilet?:No Moving around from place to place?: No  Fall Risk Assessment In the past year have you fallen or had a near fall?:No Are you currently taking any medications that make you dizzy?:No   Hearing Difficulties: No Do you often ask people to speak up or repeat themselves?:No Do you experience ringing or noises in your ears?:No Do you have difficulty understanding soft or whispered voices?:No  Cognitive Testing  Alert? Yes  Normal Appearance?Yes  Oriented to person? Yes Place? Yes  Time? Yes  Displays appropriate judgment?Yes  Can read the correct time from a watch face? yes Are you having problems remembering things?No  Advanced Directives have been discussed with the patient?Yes, brochure given  , full code   List the Names of Other Physician/Practitioners you currently use:  Dr Jorja Loa (opth)  Dr Zigmund Daniel (opth)  Indicate any recent Medical Services you may have received from other than [facility] providers in the past year (date may be approximate).   Assessment:    Annual Wellness Exam   Plan:    Medicare Attestation  I have personally reviewed:  The patient's medical and social history  Their use of alcohol, tobacco or illicit drugs  Their current medications and supplements  The patient's functional ability including ADLs,fall risks, home safety risks, cognitive, and hearing and visual impairment  Diet and physical activities  Evidence for depression or mood disorders  The patient's weight, height, BMI, and visual acuity have been recorded in the chart. I have made referrals, counseling, and provided education to the patient based on review of the above and I have provided the patient with a written personalized care plan for preventive services.      Review of Systems     Objective:   Physical Exam BP 130/78 mmHg  Pulse 73  Resp 16  Ht 5\' 2"  (1.575 m)  Wt 143 lb (64.864 kg)  BMI 26.15 kg/m2  SpO2 100%       Assessment &  Plan:  Initial Medicare annual wellness visit Annual exam as documented. Counseling done  re healthy lifestyle involving commitment to 150 minutes exercise per week, heart healthy diet, and attaining healthy weight.The importance of adequate sleep also discussed. Regular seat belt use and home safety, is also discussed. Changes in health habits are decided on by the patient with goals and time frames  set for achieving them. Immunization and cancer screening needs  are specifically addressed at this visit.   Depression PHQ 9 score of 3 on 03/15/2015

## 2015-03-15 NOTE — Patient Instructions (Signed)
Annual physical exam in 4.5 month, call if you need me sooner  Flu vaccine today  Fasting labs Jan 9 opr after  Please fill out living will  Please increase exercuise to 30 minutes daily and reduce portion sizes to promote weight loss  Nightmares likely due to TV bein on, need to turn off light and sound for good sleep, or use ear plugs and eye cover  Thanks for choosing [facility], we consider it a privelige to serve you.  All the best for 2017!    Insomnia Insomnia is a sleep disorder that makes it difficult to fall asleep or to stay asleep. Insomnia can cause tiredness (fatigue), low energy, difficulty concentrating, mood swings, and poor performance at work or school.  There are three different ways to classify insomnia:  Difficulty falling asleep.  Difficulty staying asleep.  Waking up too early in the morning. Any type of insomnia can be long-term (chronic) or short-term (acute). Both are common. Short-term insomnia usually lasts for three months or less. Chronic insomnia occurs at least three times a week for longer than three months. CAUSES  Insomnia may be caused by another condition, situation, or substance, such as:  Anxiety.  Certain medicines.  Gastroesophageal reflux disease (GERD) or other gastrointestinal conditions.  Asthma or other breathing conditions.  Restless legs syndrome, sleep apnea, or other sleep disorders.  Chronic pain.  Menopause. This may include hot flashes.  Stroke.  Abuse of alcohol, tobacco, or illegal drugs.  Depression.  Caffeine.   Neurological disorders, such as Alzheimer disease.  An overactive thyroid (hyperthyroidism). The cause of insomnia may not be known. RISK FACTORS Risk factors for insomnia include:  Gender. Women are more commonly affected than men.  Age. Insomnia is more common as you get older.  Stress. This may involve your professional or personal life.  Income. Insomnia is more  common in people with lower income.  Lack of exercise.   Irregular work schedule or night shifts.  Traveling between different time zones. SIGNS AND SYMPTOMS If you have insomnia, trouble falling asleep or trouble staying asleep is the main symptom. This may lead to other symptoms, such as:  Feeling fatigued.  Feeling nervous about going to sleep.  Not feeling rested in the morning.  Having trouble concentrating.  Feeling irritable, anxious, or depressed. TREATMENT  Treatment for insomnia depends on the cause. If your insomnia is caused by an underlying condition, treatment will focus on addressing the condition. Treatment may also include:   Medicines to help you sleep.  Counseling or therapy.  Lifestyle adjustments. HOME CARE INSTRUCTIONS   Take medicines only as directed by your health care provider.  Keep regular sleeping and waking hours. Avoid naps.  Keep a sleep diary to help you and your health care provider figure out what could be causing your insomnia. Include:   When you sleep.  When you wake up during the night.  How well you sleep.   How rested you feel the next day.  Any side effects of medicines you are taking.  What you eat and drink.   Make your bedroom a comfortable place where it is easy to fall asleep:  Put up shades or special blackout curtains to block light from outside.  Use a white noise machine to block noise.  Keep the temperature cool.   Exercise regularly as directed by your health care provider. Avoid exercising right before bedtime.  Use relaxation techniques to manage stress. Ask your health care provider  to suggest some techniques that may work well for you. These may include:  Breathing exercises.  Routines to release muscle tension.  Visualizing peaceful scenes.  Cut back on alcohol, caffeinated beverages, and cigarettes, especially close to bedtime. These can disrupt your sleep.  Do not overeat or eat spicy  foods right before bedtime. This can lead to digestive discomfort that can make it hard for you to sleep.  Limit screen use before bedtime. This includes:  Watching TV.  Using your smartphone, tablet, and computer.  Stick to a routine. This can help you fall asleep faster. Try to do a quiet activity, brush your teeth, and go to bed at the same time each night.  Get out of bed if you are still awake after 15 minutes of trying to sleep. Keep the lights down, but try reading or doing a quiet activity. When you feel sleepy, go back to bed.  Make sure that you drive carefully. Avoid driving if you feel very sleepy.  Keep all follow-up appointments as directed by your health care provider. This is important. SEEK MEDICAL CARE IF:   You are tired throughout the day or have trouble in your daily routine due to sleepiness.  You continue to have sleep problems or your sleep problems get worse. SEEK IMMEDIATE MEDICAL CARE IF:   You have serious thoughts about hurting yourself or someone else.   This information is not intended to replace advice given to you by your health care provider. Make sure you discuss any questions you have with your health care provider.   Document Released: 03/02/2000 Document Revised: 11/24/2014 Document Reviewed: 12/04/2013 Elsevier Interactive Patient Education [employer].

## 2015-03-15 NOTE — Addendum Note (Signed)
Addended by: Eual Fines on: 03/15/2015 08:51 AM   Modules accepted: Orders

## 2015-03-15 NOTE — Assessment & Plan Note (Signed)

## 2015-03-15 NOTE — Assessment & Plan Note (Signed)
PHQ 9 score of 3 on 03/15/2015

## 2015-04-18 LAB — CBC WITH DIFFERENTIAL/PLATELET
Basophils Absolute: 0.1 10*3/uL (ref 0.0–0.1)
Basophils Relative: 1 % (ref 0–1)
EOS ABS: 0.1 10*3/uL (ref 0.0–0.7)
Eosinophils Relative: 1 % (ref 0–5)
HCT: 40.3 % (ref 36.0–46.0)
Hemoglobin: 13.4 g/dL (ref 12.0–15.0)
Lymphocytes Relative: 45 % (ref 12–46)
Lymphs Abs: 2.3 10*3/uL (ref 0.7–4.0)
MCH: 30.3 pg (ref 26.0–34.0)
MCHC: 33.3 g/dL (ref 30.0–36.0)
MCV: 91.2 fL (ref 78.0–100.0)
MONOS PCT: 7 % (ref 3–12)
MPV: 9.5 fL (ref 8.6–12.4)
Monocytes Absolute: 0.4 10*3/uL (ref 0.1–1.0)
Neutro Abs: 2.4 10*3/uL (ref 1.7–7.7)
Neutrophils Relative %: 46 % (ref 43–77)
PLATELETS: 271 10*3/uL (ref 150–400)
RBC: 4.42 MIL/uL (ref 3.87–5.11)
RDW: 13.6 % (ref 11.5–15.5)
WBC: 5.2 10*3/uL (ref 4.0–10.5)

## 2015-04-19 LAB — COMPLETE METABOLIC PANEL WITH GFR
ALBUMIN: 4.1 g/dL (ref 3.6–5.1)
ALK PHOS: 91 U/L (ref 33–130)
ALT: 11 U/L (ref 6–29)
AST: 12 U/L (ref 10–35)
BUN: 15 mg/dL (ref 7–25)
CO2: 29 mmol/L (ref 20–31)
Calcium: 9.5 mg/dL (ref 8.6–10.4)
Chloride: 103 mmol/L (ref 98–110)
Creat: 0.97 mg/dL (ref 0.50–0.99)
GFR, Est African American: 70 mL/min (ref >=60)
GFR, Est Non African American: 61 mL/min (ref >=60)
GLUCOSE: 88 mg/dL (ref 65–99)
POTASSIUM: 4.1 mmol/L (ref 3.5–5.3)
Sodium: 139 mmol/L (ref 135–146)
Total Bilirubin: 0.7 mg/dL (ref 0.2–1.2)
Total Protein: 7.5 g/dL (ref 6.1–8.1)

## 2015-04-19 LAB — LIPID PANEL
Cholesterol: 211 mg/dL — ABNORMAL HIGH (ref 125–200)
HDL: 73 mg/dL (ref >=46)
LDL Cholesterol: 122 mg/dL (ref <130)
Total CHOL/HDL Ratio: 2.9 Ratio (ref <=5.0)
Triglycerides: 80 mg/dL (ref <150)
VLDL: 16 mg/dL (ref <30)

## 2015-04-19 LAB — VITAMIN D 25 HYDROXY (VIT D DEFICIENCY, FRACTURES): Vit D, 25-Hydroxy: 15 ng/mL — ABNORMAL LOW (ref 30–100)

## 2015-04-19 LAB — HEPATITIS C RNA QUANTITATIVE
HCV Quantitative Log: 7.34 {Log} — ABNORMAL HIGH (ref <1.18)
HCV Quantitative: 21727927 IU/mL — ABNORMAL HIGH (ref <15)

## 2015-04-19 LAB — HEPATITIS C ANTIBODY: HCV Ab: REACTIVE — AB

## 2015-04-19 LAB — TSH: TSH: 1.54 u[IU]/mL (ref 0.350–4.500)

## 2015-04-21 ENCOUNTER — Encounter (INDEPENDENT_AMBULATORY_CARE_PROVIDER_SITE_OTHER): Payer: Self-pay | Admitting: *Deleted

## 2015-04-21 ENCOUNTER — Telehealth: Payer: Self-pay

## 2015-04-21 DIAGNOSIS — R768 Other specified abnormal immunological findings in serum: Secondary | ICD-10-CM

## 2015-04-21 NOTE — Telephone Encounter (Signed)
Referral entered  

## 2015-05-03 ENCOUNTER — Encounter (INDEPENDENT_AMBULATORY_CARE_PROVIDER_SITE_OTHER): Payer: Self-pay | Admitting: *Deleted

## 2015-05-03 ENCOUNTER — Ambulatory Visit (INDEPENDENT_AMBULATORY_CARE_PROVIDER_SITE_OTHER): Payer: Medicare Other | Admitting: Internal Medicine

## 2015-05-03 ENCOUNTER — Encounter (INDEPENDENT_AMBULATORY_CARE_PROVIDER_SITE_OTHER): Payer: Self-pay | Admitting: Internal Medicine

## 2015-05-03 VITALS — BP 130/90 | HR 74 | Temp 98.6°F | Resp 18 | Ht 64.0 in | Wt 140.6 lb

## 2015-05-03 DIAGNOSIS — B1920 Unspecified viral hepatitis C without hepatic coma: Secondary | ICD-10-CM | POA: Diagnosis not present

## 2015-05-03 NOTE — Patient Instructions (Signed)
Physician will call with results of blood tests and ultrasound.

## 2015-05-03 NOTE — Progress Notes (Signed)
Presenting complaint;  For evaluation and treatment of recently diagnosed hepatitis C.  History of present illness.:  Patient is 70 year old African-American female was referred to courtesy of Dr. Tula Nakayama for further evaluation and treatment of recently diagnosed hepatitis C. Patient's husband was diagnosed in treated for hepatitis see recently and she was therefore advised to be tested. There is no history of icteric hepatitis or jaundice. She has had yearly blood work and her transaminases always have been normal. She has never received blood transfusion. There is no history of tattoos or accidental needle exposure IV drug use. She has very good appetite.  Bowels move regularly. She denies melena or rectal bleeding. Patient states she lost 20 pounds last year while she was looking after her sick mother who is passed away. However she has gained all of this weight back. She has not been vaccinated for hepatitis ARB.    Current Medications: Outpatient Encounter Prescriptions as of 05/03/2015  Medication Sig  . aspirin 81 MG tablet Take 81 mg by mouth daily.  . Calcium Carbonate-Vitamin D (CALTRATE 600+D) 600-400 MG-UNIT per tablet Take 2 tablets by mouth daily.  . fluticasone (FLONASE) 50 MCG/ACT nasal spray PLACE 2 SPRAYS INTO THE NOSE DAILY.  . hydrochlorothiazide (HYDRODIURIL) 25 MG tablet TAKE 1 TABLET BY MOUTH EVERY DAY  . KLOR-CON M10 10 MEQ tablet TAKE 1 TABLET BY MOUTH TWICE A DAY  . latanoprost (XALATAN) 0.005 % ophthalmic solution Place 1 drop into both eyes at bedtime.  . mirtazapine (REMERON) 7.5 MG tablet Take 1 tablet (7.5 mg total) by mouth at bedtime.  Marland Kitchen trimethoprim-polymyxin b (POLYTRIM) ophthalmic solution Place 1 drop into the right eye. Four drops in riight eye for 2 days. This is post injections.   No facility-administered encounter medications on file as of 05/03/2015.   Past medical history: H. pylori serology negative on 11/05/2012 History of colonic  adenomas. Last colonoscopy was on 02/20/2012 with removal of 3 small polyps and 2 were tubular adenomas. Mild hypertension. Hyperlipidemia. Depression. Laser therapy for retinal hemorrhage few months ago. Lumbar spine surgery over 20 years ago for distant disease. Hysterectomy several years ago. Cholecystectomy in February 2010.   Allergies: Allergies  Allergen Reactions  . Ace Inhibitors Cough   Family history: Mother had dementia and died at age 58. Father was diagnosed with colorectal cancer at age 57 and died at 70. She has 2 brothers ages 32 and 33 and a sister age 96. They're all in good health.  Social history: Patient is married. She is accompanied by her husband today. She is retired. She works at [employer] for 18 years and then a [employer] for 12 years. She smokes cigarettes for 15 years, less than a pack per day and quit over 20 years ago. She drinks alcohol occasionally. She has 2 daughters ages 54 and 4 in good health.   Physical examination:  Blood pressure 130/90, pulse 74, temperature 98.6 F (37 C), temperature source Oral, resp. rate 18, height 5\' 4"  (1.626 m), weight 140 lb 9.6 oz (63.776 kg). Patient is alert and in no acute distress. Conjunctiva is pink. Sclera is nonicteric Oropharyngeal mucosa is normal. No neck masses or thyromegaly noted. Cardiac exam with regular rhythm normal S1 and S2. No murmur or gallop noted. Lungs are clear to auscultation. Abdomen is symmetrical soft and nontender without organomegaly or masses.  No LE edema or clubbing noted.  Labs/studies Results: Lab data from 04/18/2015  HCV antibody reactive  HCV quantitative [id number] HCV quantitative  log  7.34  WBC 5.2, H&H 13.4 and 40.3 and platelet count 271K Serum sodium 139, potassium 4.1, chloride 103, CO2 29, glucose 88, BUN 15 and creatinine 0.97  Bilirubin 0.7, AP 91, AST 12, ALT 11, total protein 7.5, albumin 4.1 and calcium 9.5.   Transaminases normal on 01/27/2013 and  05/26/2013.    Assessment:  #1. Chronic hepatitis C which was diagnosed recently. Patient has no stigmata of chronic liver disease. Her liver disease would be staged with ultrasound and alastography and we also need to know genotype before treatment options considered. If she has mild disease her insurance may or may not cover treatment. #2. Patient is high-risk for CRC. Last colonoscopy was in December 2013 and next exam would be in December 2018.   Recommendations:  Patient reassured that she has very good prognosis. Patient will go the lab for genotype testing. Upper abdominal ultrasound with elastography. Once all of the data is available will send paperwork to her insurance for treatment authorization Office visit in 3 months.

## 2015-05-05 ENCOUNTER — Encounter (INDEPENDENT_AMBULATORY_CARE_PROVIDER_SITE_OTHER): Payer: Medicare Other | Admitting: Ophthalmology

## 2015-05-05 DIAGNOSIS — I10 Essential (primary) hypertension: Secondary | ICD-10-CM

## 2015-05-05 DIAGNOSIS — H34831 Tributary (branch) retinal vein occlusion, right eye, with macular edema: Secondary | ICD-10-CM

## 2015-05-05 DIAGNOSIS — H43813 Vitreous degeneration, bilateral: Secondary | ICD-10-CM

## 2015-05-05 DIAGNOSIS — H35033 Hypertensive retinopathy, bilateral: Secondary | ICD-10-CM

## 2015-05-05 DIAGNOSIS — H2513 Age-related nuclear cataract, bilateral: Secondary | ICD-10-CM | POA: Diagnosis not present

## 2015-05-06 LAB — HEPATITIS C GENOTYPE

## 2015-05-09 ENCOUNTER — Ambulatory Visit (HOSPITAL_COMMUNITY)
Admission: RE | Admit: 2015-05-09 | Discharge: 2015-05-09 | Disposition: A | Discharge status: Home / Self Care | Payer: Medicare Other | Source: Ambulatory Visit | Attending: Internal Medicine | Admitting: Internal Medicine

## 2015-05-09 DIAGNOSIS — Z9049 Acquired absence of other specified parts of digestive tract: Secondary | ICD-10-CM | POA: Insufficient documentation

## 2015-05-09 DIAGNOSIS — B1920 Unspecified viral hepatitis C without hepatic coma: Secondary | ICD-10-CM

## 2015-05-25 ENCOUNTER — Encounter (INDEPENDENT_AMBULATORY_CARE_PROVIDER_SITE_OTHER): Payer: Medicare Other | Admitting: Internal Medicine

## 2015-05-30 ENCOUNTER — Telehealth: Payer: Self-pay | Admitting: Family Medicine

## 2015-05-30 ENCOUNTER — Other Ambulatory Visit: Payer: Self-pay

## 2015-05-30 DIAGNOSIS — F329 Major depressive disorder, single episode, unspecified: Secondary | ICD-10-CM

## 2015-05-30 DIAGNOSIS — F32A Depression, unspecified: Secondary | ICD-10-CM

## 2015-05-30 MED ORDER — MIRTAZAPINE 7.5 MG PO TABS: 7.5000 mg | ORAL_TABLET | Freq: Every day | ORAL | Status: DC

## 2015-05-30 NOTE — Telephone Encounter (Signed)
Patient is calling stating that she has changed pharmacys to [place] and she needs an authorized refill on mirtazapine (REMERON) 7.5 MG tablet, please advise?

## 2015-05-30 NOTE — Telephone Encounter (Signed)
Medication refilled to requested pharmacy  

## 2015-06-02 ENCOUNTER — Telehealth (INDEPENDENT_AMBULATORY_CARE_PROVIDER_SITE_OTHER): Payer: Self-pay | Admitting: Internal Medicine

## 2015-06-02 NOTE — Telephone Encounter (Signed)
A Rep left a message in regards to confirming the delivery date, time, and address for pt's Epclusa Rx. The phone number for the representative is [phone]. Thank you.

## 2015-06-06 NOTE — Telephone Encounter (Signed)
I have called and the patient's medication will be sent to our [facility] on 06/08/2015. Patient was notified.

## 2015-07-11 DIAGNOSIS — H401111 Primary open-angle glaucoma, right eye, mild stage: Secondary | ICD-10-CM | POA: Diagnosis not present

## 2015-07-28 ENCOUNTER — Encounter (INDEPENDENT_AMBULATORY_CARE_PROVIDER_SITE_OTHER): Payer: Medicare Other | Admitting: Ophthalmology

## 2015-07-28 DIAGNOSIS — H2513 Age-related nuclear cataract, bilateral: Secondary | ICD-10-CM

## 2015-07-28 DIAGNOSIS — H34831 Tributary (branch) retinal vein occlusion, right eye, with macular edema: Secondary | ICD-10-CM

## 2015-07-28 DIAGNOSIS — I10 Essential (primary) hypertension: Secondary | ICD-10-CM | POA: Diagnosis not present

## 2015-07-28 DIAGNOSIS — H35033 Hypertensive retinopathy, bilateral: Secondary | ICD-10-CM | POA: Diagnosis not present

## 2015-07-28 DIAGNOSIS — H35412 Lattice degeneration of retina, left eye: Secondary | ICD-10-CM

## 2015-08-03 ENCOUNTER — Encounter: Payer: Medicare Other | Admitting: Family Medicine

## 2015-08-22 ENCOUNTER — Telehealth: Payer: Self-pay | Admitting: Family Medicine

## 2015-08-22 ENCOUNTER — Other Ambulatory Visit: Payer: Self-pay

## 2015-08-22 MED ORDER — HYDROCHLOROTHIAZIDE 25 MG PO TABS: 25.0000 mg | ORAL_TABLET | Freq: Every day | ORAL | Status: DC

## 2015-08-22 NOTE — Telephone Encounter (Signed)
Patient states that she needs a new Rx for her hydrochlorothiazide (HYDRODIURIL) 25 MG tablet  Sent to her new pharmacy [facility] , please advise?

## 2015-08-22 NOTE — Telephone Encounter (Signed)
Medication refilled as requested.

## 2015-08-23 ENCOUNTER — Telehealth (INDEPENDENT_AMBULATORY_CARE_PROVIDER_SITE_OTHER): Payer: Self-pay | Admitting: *Deleted

## 2015-08-23 ENCOUNTER — Ambulatory Visit (INDEPENDENT_AMBULATORY_CARE_PROVIDER_SITE_OTHER): Payer: Medicare Other | Admitting: Internal Medicine

## 2015-08-23 ENCOUNTER — Encounter (INDEPENDENT_AMBULATORY_CARE_PROVIDER_SITE_OTHER): Payer: Self-pay | Admitting: Internal Medicine

## 2015-08-23 VITALS — BP 118/72 | HR 74 | Temp 98.0°F | Resp 18 | Ht 64.0 in | Wt 137.5 lb

## 2015-08-23 DIAGNOSIS — B1920 Unspecified viral hepatitis C without hepatic coma: Secondary | ICD-10-CM | POA: Diagnosis not present

## 2015-08-23 DIAGNOSIS — B182 Chronic viral hepatitis C: Secondary | ICD-10-CM

## 2015-08-23 NOTE — Patient Instructions (Signed)
Please go to the lab one day after finishing Epclusa which would be on 09/02/2015. Physician will call with results of blood tests when completed.

## 2015-08-23 NOTE — Telephone Encounter (Signed)
Per Dr.Rehman the patient will need to have labs drawn. 

## 2015-08-23 NOTE — Progress Notes (Signed)
Presenting complaint;  Follow-up for chronic hepatitis C.  Subjective:  Christina Miles is 70 year old African-American female who has history of chronic hepatitis C genotype 2b whose disease was estimated to be stage F0/F1 based on elastography. She was begun on Epclusa on around 06/08/2015. She has 9 more doses left. She feels fine. She has had no side effects whatsoever. She has good appetite. She denies fatigue skin rash or joint pains. She did not have blood work as planned 4 weeks into therapy.   Current Medications: Outpatient Encounter Prescriptions as of 08/23/2015  Medication Sig  . aspirin 81 MG tablet Take 81 mg by mouth daily.  . Calcium Carbonate-Vitamin D (CALTRATE 600+D) 600-400 MG-UNIT per tablet Take 2 tablets by mouth daily.  . EPCLUSA 400-100 MG TABS daily.   . fluticasone (FLONASE) 50 MCG/ACT nasal spray PLACE 2 SPRAYS INTO THE NOSE DAILY.  . hydrochlorothiazide (HYDRODIURIL) 25 MG tablet Take 1 tablet (25 mg total) by mouth daily.  Marland Kitchen KLOR-CON M10 10 MEQ tablet TAKE 1 TABLET BY MOUTH TWICE A DAY  . latanoprost (XALATAN) 0.005 % ophthalmic solution Place 1 drop into both eyes at bedtime.  . mirtazapine (REMERON) 7.5 MG tablet Take 1 tablet (7.5 mg total) by mouth at bedtime.  Marland Kitchen trimethoprim-polymyxin b (POLYTRIM) ophthalmic solution Place 1 drop into the right eye. Four drops in riight eye for 2 days. This is post injections.  . [DISCONTINUED] potassium chloride (K-DUR) 10 MEQ tablet Take 10 mEq by mouth 2 (two) times daily. Reported on 08/23/2015   No facility-administered encounter medications on file as of 08/23/2015.     Objective: Blood pressure 118/72, pulse 74, temperature 98 F (36.7 C), temperature source Oral, resp. rate 18, height 5\' 4"  (1.626 m), weight 137 lb 8 oz (62.37 kg). Patient is alert and in no acute distress. Conjunctiva is pink. Sclera is nonicteric Oropharyngeal mucosa is normal. No neck masses or thyromegaly noted. Cardiac exam with regular rhythm  normal S1 and S2. No murmur or gallop noted. Lungs are clear to auscultation. Abdomen is symmetrical soft and nontender without organomegaly or masses.  No LE edema or clubbing noted.   Assessment:  #1. History of chronic hepatitis C genotype 2b, stage F0/F1. She is finishing therapy with Epclusa. She has 9 more doses left. She did not have evaluation at 4 weeks. She will have evaluation at and of treatment and then again in 4 months to document sustained virologic logical response. #2. History of colonic adenomas. Last colonoscopy was in December 2013 and next exam would be in December 2018.   Plan:  Patient will go the lab for CBC, comprehensive chemistry panel and HCVRNA quantitative on 09/02/2015. Office visit in one year.

## 2015-09-02 DIAGNOSIS — B1920 Unspecified viral hepatitis C without hepatic coma: Secondary | ICD-10-CM | POA: Diagnosis not present

## 2015-09-02 DIAGNOSIS — B182 Chronic viral hepatitis C: Secondary | ICD-10-CM | POA: Diagnosis not present

## 2015-09-03 LAB — COMPREHENSIVE METABOLIC PANEL
ALBUMIN: 4.1 g/dL (ref 3.6–5.1)
ALK PHOS: 81 U/L (ref 33–130)
ALT: 14 U/L (ref 6–29)
AST: 17 U/L (ref 10–35)
BILIRUBIN TOTAL: 0.6 mg/dL (ref 0.2–1.2)
BUN: 15 mg/dL (ref 7–25)
CALCIUM: 9.8 mg/dL (ref 8.6–10.4)
CO2: 27 mmol/L (ref 20–31)
Chloride: 102 mmol/L (ref 98–110)
Creat: 1.03 mg/dL — ABNORMAL HIGH (ref 0.50–0.99)
Glucose, Bld: 76 mg/dL (ref 65–99)
Potassium: 4.1 mmol/L (ref 3.5–5.3)
Sodium: 140 mmol/L (ref 135–146)
TOTAL PROTEIN: 7.5 g/dL (ref 6.1–8.1)

## 2015-09-03 LAB — CBC
HEMATOCRIT: 38.9 % (ref 35.0–45.0)
HEMOGLOBIN: 12.6 g/dL (ref 11.7–15.5)
MCH: 29.8 pg (ref 27.0–33.0)
MCHC: 32.4 g/dL (ref 32.0–36.0)
MCV: 92 fL (ref 80.0–100.0)
MPV: 10.2 fL (ref 7.5–12.5)
Platelets: 314 10*3/uL (ref 140–400)
RBC: 4.23 MIL/uL (ref 3.80–5.10)
RDW: 13.6 % (ref 11.0–15.0)
WBC: 5.1 10*3/uL (ref 3.8–10.8)

## 2015-09-05 LAB — HEPATITIS C RNA QUANTITATIVE: HCV Quantitative: NOT DETECTED IU/mL (ref <15)

## 2015-09-27 ENCOUNTER — Ambulatory Visit (INDEPENDENT_AMBULATORY_CARE_PROVIDER_SITE_OTHER): Payer: Medicare Other | Admitting: Family Medicine

## 2015-09-27 ENCOUNTER — Encounter: Payer: Self-pay | Admitting: Family Medicine

## 2015-09-27 VITALS — BP 126/82 | HR 76 | Resp 16 | Ht 64.0 in | Wt 137.0 lb

## 2015-09-27 DIAGNOSIS — Z1211 Encounter for screening for malignant neoplasm of colon: Secondary | ICD-10-CM | POA: Diagnosis not present

## 2015-09-27 DIAGNOSIS — Z1231 Encounter for screening mammogram for malignant neoplasm of breast: Secondary | ICD-10-CM

## 2015-09-27 DIAGNOSIS — Z0000 Encounter for general adult medical examination without abnormal findings: Secondary | ICD-10-CM | POA: Diagnosis not present

## 2015-09-27 LAB — POC HEMOCCULT BLD/STL (OFFICE/1-CARD/DIAGNOSTIC): Fecal Occult Blood, POC: NEGATIVE

## 2015-09-27 MED ORDER — ERGOCALCIFEROL 1.25 MG (50000 UT) PO CAPS: 50000.0000 [IU] | ORAL_CAPSULE | ORAL | Status: DC

## 2015-09-27 NOTE — Assessment & Plan Note (Signed)

## 2015-09-27 NOTE — Progress Notes (Signed)
    Christina Miles     MRN: [id number]      DOB: 25-Apr-1945  HPI: Patient is in for annual physical exam. No other health concerns are expressed or addressed at the visit. Recent labs, if available are reviewed. Immunization is reviewed , and  updated if needed.   PE: Pleasant  female, alert and oriented x 3, in no cardio-pulmonary distress. Afebrile. HEENT No facial trauma or asymetry. Sinuses non tender.  Extra occullar muscles intact, pupils equally reactive to light. External ears normal, tympanic membranes clear. Oropharynx moist, no exudate. Neck: supple, no adenopathy,JVD or thyromegaly.No bruits.  Chest: Clear to ascultation bilaterally.No crackles or wheezes. Non tender to palpation  Breast: No asymetry,no masses or lumps. No tenderness. No nipple discharge or inversion. No axillary or supraclavicular adenopathy  Cardiovascular system; Heart sounds normal,  S1 and  S2 ,no S3.  No murmur, or thrill. Apical beat not displaced Peripheral pulses normal.  Abdomen: Soft, non tender, no organomegaly or masses. No bruits. Bowel sounds normal. No guarding, tenderness or rebound.  Rectal:  Normal sphincter tone. No rectal mass. Guaiac negative stool.  GU: External genitalia normal female genitalia , normal female distribution of hair. No lesions. Urethral meatus normal in size, no  Prolapse, no lesions visibly  Present. Bladder non tender. Vagina pink and moist , with no visible lesions , discharge present . Adequate pelvic support no  cystocele or rectocele noted Cervix pink and appears healthy, no lesions or ulcerations noted, no discharge noted from os Uterus normal size, no adnexal masses, no cervical motion or adnexal tenderness.   Musculoskeletal exam: Full ROM of spine, hips , shoulders and knees. No deformity ,swelling or crepitus noted. No muscle wasting or atrophy.   Neurologic: Cranial nerves 2 to 12 intact. Power, tone ,sensation and reflexes  normal throughout. No disturbance in gait. No tremor.  Skin: Intact, no ulceration, erythema , scaling or rash noted. Pigmentation normal throughout  Psych; Normal mood and affect. Judgement and concentration normal   Assessment & Plan:  Encounter for annual physical exam Annual exam as documented. Counseling done  re healthy lifestyle involving commitment to 150 minutes exercise per week, heart healthy diet, and attaining healthy weight.The importance of adequate sleep also discussed. Regular seat belt use and home safety, is also discussed. Changes in health habits are decided on by the patient with goals and time frames  set for achieving them. Immunization and cancer screening needs are specifically addressed at this visit.

## 2015-09-27 NOTE — Patient Instructions (Addendum)
Annual wellness first week in January , call if you need me sooner  Pls comne for flu vaccine in Septemebr  Pls commit to daily vit D3 800 IU if the weekly script is too costly  You are referred for mammogram, pls schedule , due in early November,   Thank you  for choosing [facility]. We consider it a privelige to serve you.  Delivering excellent health care in a caring and  compassionate way is our goal.  Partnering with you,  so that together we can achieve this goal is our strategy.

## 2015-11-02 ENCOUNTER — Encounter (INDEPENDENT_AMBULATORY_CARE_PROVIDER_SITE_OTHER): Payer: Medicare Other | Admitting: Ophthalmology

## 2015-11-07 ENCOUNTER — Encounter (INDEPENDENT_AMBULATORY_CARE_PROVIDER_SITE_OTHER): Payer: Medicare Other | Admitting: Ophthalmology

## 2015-11-07 DIAGNOSIS — I10 Essential (primary) hypertension: Secondary | ICD-10-CM | POA: Diagnosis not present

## 2015-11-07 DIAGNOSIS — H2513 Age-related nuclear cataract, bilateral: Secondary | ICD-10-CM | POA: Diagnosis not present

## 2015-11-07 DIAGNOSIS — H43813 Vitreous degeneration, bilateral: Secondary | ICD-10-CM | POA: Diagnosis not present

## 2015-11-07 DIAGNOSIS — H35033 Hypertensive retinopathy, bilateral: Secondary | ICD-10-CM

## 2015-11-07 DIAGNOSIS — H34831 Tributary (branch) retinal vein occlusion, right eye, with macular edema: Secondary | ICD-10-CM

## 2015-12-09 ENCOUNTER — Other Ambulatory Visit: Payer: Self-pay | Admitting: Family Medicine

## 2015-12-09 DIAGNOSIS — F32A Depression, unspecified: Secondary | ICD-10-CM

## 2015-12-09 DIAGNOSIS — F329 Major depressive disorder, single episode, unspecified: Secondary | ICD-10-CM

## 2016-01-04 ENCOUNTER — Other Ambulatory Visit: Payer: Self-pay | Admitting: Family Medicine

## 2016-01-19 ENCOUNTER — Other Ambulatory Visit: Payer: Self-pay | Admitting: Family Medicine

## 2016-01-19 ENCOUNTER — Ambulatory Visit (HOSPITAL_COMMUNITY)
Admission: RE | Admit: 2016-01-19 | Discharge: 2016-01-19 | Disposition: A | Discharge status: Home / Self Care | Payer: Medicare Other | Source: Ambulatory Visit | Attending: Family Medicine | Admitting: Family Medicine

## 2016-01-19 DIAGNOSIS — Z1231 Encounter for screening mammogram for malignant neoplasm of breast: Secondary | ICD-10-CM | POA: Insufficient documentation

## 2016-01-26 ENCOUNTER — Ambulatory Visit (INDEPENDENT_AMBULATORY_CARE_PROVIDER_SITE_OTHER): Payer: Medicare Other

## 2016-01-26 DIAGNOSIS — Z23 Encounter for immunization: Secondary | ICD-10-CM | POA: Diagnosis not present

## 2016-02-27 ENCOUNTER — Encounter (INDEPENDENT_AMBULATORY_CARE_PROVIDER_SITE_OTHER): Payer: Medicare Other | Admitting: Ophthalmology

## 2016-02-27 DIAGNOSIS — H35033 Hypertensive retinopathy, bilateral: Secondary | ICD-10-CM

## 2016-02-27 DIAGNOSIS — H2513 Age-related nuclear cataract, bilateral: Secondary | ICD-10-CM

## 2016-02-27 DIAGNOSIS — H43813 Vitreous degeneration, bilateral: Secondary | ICD-10-CM

## 2016-02-27 DIAGNOSIS — H34831 Tributary (branch) retinal vein occlusion, right eye, with macular edema: Secondary | ICD-10-CM

## 2016-02-27 DIAGNOSIS — I10 Essential (primary) hypertension: Secondary | ICD-10-CM

## 2016-03-08 ENCOUNTER — Ambulatory Visit: Payer: Medicare Other

## 2016-03-11 ENCOUNTER — Other Ambulatory Visit: Payer: Self-pay | Admitting: Family Medicine

## 2016-03-11 DIAGNOSIS — F32A Depression, unspecified: Secondary | ICD-10-CM

## 2016-03-11 DIAGNOSIS — F329 Major depressive disorder, single episode, unspecified: Secondary | ICD-10-CM

## 2016-04-05 ENCOUNTER — Other Ambulatory Visit: Payer: Self-pay | Admitting: Family Medicine

## 2016-04-05 ENCOUNTER — Encounter: Payer: Medicare Other | Admitting: Family Medicine

## 2016-04-23 ENCOUNTER — Encounter (INDEPENDENT_AMBULATORY_CARE_PROVIDER_SITE_OTHER): Payer: Medicare Other | Admitting: Ophthalmology

## 2016-04-23 DIAGNOSIS — I10 Essential (primary) hypertension: Secondary | ICD-10-CM | POA: Diagnosis not present

## 2016-04-23 DIAGNOSIS — H35033 Hypertensive retinopathy, bilateral: Secondary | ICD-10-CM | POA: Diagnosis not present

## 2016-04-23 DIAGNOSIS — H34831 Tributary (branch) retinal vein occlusion, right eye, with macular edema: Secondary | ICD-10-CM

## 2016-04-23 DIAGNOSIS — H2513 Age-related nuclear cataract, bilateral: Secondary | ICD-10-CM | POA: Diagnosis not present

## 2016-04-23 DIAGNOSIS — H43813 Vitreous degeneration, bilateral: Secondary | ICD-10-CM

## 2016-05-29 ENCOUNTER — Other Ambulatory Visit: Payer: Self-pay | Admitting: Family Medicine

## 2016-06-04 ENCOUNTER — Ambulatory Visit (INDEPENDENT_AMBULATORY_CARE_PROVIDER_SITE_OTHER): Payer: Medicare Other | Admitting: Family Medicine

## 2016-06-04 ENCOUNTER — Encounter: Payer: Self-pay | Admitting: Family Medicine

## 2016-06-04 VITALS — BP 166/90 | HR 75 | Resp 16 | Ht 64.0 in | Wt 141.0 lb

## 2016-06-04 DIAGNOSIS — Z0000 Encounter for general adult medical examination without abnormal findings: Secondary | ICD-10-CM | POA: Insufficient documentation

## 2016-06-04 DIAGNOSIS — I10 Essential (primary) hypertension: Secondary | ICD-10-CM

## 2016-06-04 DIAGNOSIS — E785 Hyperlipidemia, unspecified: Secondary | ICD-10-CM | POA: Diagnosis not present

## 2016-06-04 DIAGNOSIS — E559 Vitamin D deficiency, unspecified: Secondary | ICD-10-CM

## 2016-06-04 NOTE — Patient Instructions (Addendum)
Nurse BP check in 10 to 14 days.  Annual physical exam end August  Fasting labs asap  Blood Pressure HIGH, you need to take medication every day at same time, and follow DASH diet, rich in vegetable and fruit and low in sodium, rich in potassium  Commit to 30 minutes exercise at least 5 days per week  Info re living will is being given   DASH Eating Plan DASH stands for "Dietary Approaches to Stop Hypertension." The DASH eating plan is a healthy eating plan that has been shown to reduce high blood pressure (hypertension). It may also reduce your risk for type 2 diabetes, heart disease, and stroke. The DASH eating plan may also help with weight loss. What are tips for following this plan? General guidelines   Avoid eating more than 2,300 mg (milligrams) of salt (sodium) a day. If you have hypertension, you may need to reduce your sodium intake to 1,500 mg a day.  Limit alcohol intake to no more than 1 drink a day for nonpregnant women and 2 drinks a day for men. One drink equals 12 oz of beer, 5 oz of wine, or 1 oz of hard liquor.  Work with your health care provider to maintain a healthy body weight or to lose weight. Ask what an ideal weight is for you.  Get at least 30 minutes of exercise that causes your heart to beat faster (aerobic exercise) most days of the week. Activities may include walking, swimming, or biking.  Work with your health care provider or diet and nutrition specialist (dietitian) to adjust your eating plan to your individual calorie needs. Reading food labels   Check food labels for the amount of sodium per serving. Choose foods with less than 5 percent of the Daily Value of sodium. Generally, foods with less than 300 mg of sodium per serving fit into this eating plan.  To find whole grains, look for the word "whole" as the first word in the ingredient list. Shopping   Buy products labeled as "low-sodium" or "no salt added."  Buy fresh foods. Avoid canned  foods and premade or frozen meals. Cooking   Avoid adding salt when cooking. Use salt-free seasonings or herbs instead of table salt or sea salt. Check with your health care provider or pharmacist before using salt substitutes.  Do not fry foods. Cook foods using healthy methods such as baking, boiling, grilling, and broiling instead.  Cook with heart-healthy oils, such as olive, canola, soybean, or sunflower oil. Meal planning    Eat a balanced diet that includes:  5 or more servings of fruits and vegetables each day. At each meal, try to fill half of your plate with fruits and vegetables.  Up to 6-8 servings of whole grains each day.  Less than 6 oz of lean meat, poultry, or fish each day. A 3-oz serving of meat is about the same size as a deck of cards. One egg equals 1 oz.  2 servings of low-fat dairy each day.  A serving of nuts, seeds, or beans 5 times each week.  Heart-healthy fats. Healthy fats called Omega-3 fatty acids are found in foods such as flaxseeds and coldwater fish, like sardines, salmon, and mackerel.  Limit how much you eat of the following:  Canned or prepackaged foods.  Food that is high in trans fat, such as fried foods.  Food that is high in saturated fat, such as fatty meat.  Sweets, desserts, sugary drinks, and other foods with  added sugar.  Full-fat dairy products.  Do not salt foods before eating.  Try to eat at least 2 vegetarian meals each week.  Eat more home-cooked food and less restaurant, buffet, and fast food.  When eating at a restaurant, ask that your food be prepared with less salt or no salt, if possible. What foods are recommended? The items listed may not be a complete list. Talk with your dietitian about what dietary choices are best for you. Grains  Whole-grain or whole-wheat bread. Whole-grain or whole-wheat pasta. Brown rice. [place]ena Morrow. Bulgur. Whole-grain and low-sodium cereals. Pita bread. Low-fat, low-sodium  crackers. Whole-wheat flour tortillas. Vegetables  Fresh or frozen vegetables (raw, steamed, roasted, or grilled). Low-sodium or reduced-sodium tomato and vegetable juice. Low-sodium or reduced-sodium tomato sauce and tomato paste. Low-sodium or reduced-sodium canned vegetables. Fruits  All fresh, dried, or frozen fruit. Canned fruit in natural juice (without added sugar). Meat and other protein foods  Skinless chicken or [place]. Ground chicken or [place]. Pork with fat trimmed off. Fish and seafood. Egg whites. Dried beans, peas, or lentils. Unsalted nuts, nut butters, and seeds. Unsalted canned beans. Lean cuts of beef with fat trimmed off. Low-sodium, lean deli meat. Dairy  Low-fat (1%) or fat-free (skim) milk. Fat-free, low-fat, or reduced-fat cheeses. Nonfat, low-sodium ricotta or cottage cheese. Low-fat or nonfat yogurt. Low-fat, low-sodium cheese. Fats and oils  Soft margarine without trans fats. Vegetable oil. Low-fat, reduced-fat, or light mayonnaise and salad dressings (reduced-sodium). Canola, safflower, olive, soybean, and sunflower oils. Avocado. Seasoning and other foods  Herbs. Spices. Seasoning mixes without salt. Unsalted popcorn and pretzels. Fat-free sweets. What foods are not recommended? The items listed may not be a complete list. Talk with your dietitian about what dietary choices are best for you. Grains  Baked goods made with fat, such as croissants, muffins, or some breads. Dry pasta or rice meal packs. Vegetables  Creamed or fried vegetables. Vegetables in a cheese sauce. Regular canned vegetables (not low-sodium or reduced-sodium). Regular canned tomato sauce and paste (not low-sodium or reduced-sodium). Regular tomato and vegetable juice (not low-sodium or reduced-sodium). Angie Fava. Olives. Fruits  Canned fruit in a light or heavy syrup. Fried fruit. Fruit in cream or butter sauce. Meat and other protein foods  Fatty cuts of meat. Ribs. Fried meat. Berniece Salines. Sausage.  Bologna and other processed lunch meats. Salami. Fatback. Hotdogs. Bratwurst. Salted nuts and seeds. Canned beans with added salt. Canned or smoked fish. Whole eggs or egg yolks. Chicken or [place] with skin. Dairy  Whole or 2% milk, cream, and half-and-half. Whole or full-fat cream cheese. Whole-fat or sweetened yogurt. Full-fat cheese. Nondairy creamers. Whipped toppings. Processed cheese and cheese spreads. Fats and oils  Butter. Stick margarine. Lard. Shortening. Ghee. Bacon fat. Tropical oils, such as coconut, palm kernel, or palm oil. Seasoning and other foods  Salted popcorn and pretzels. Onion salt, garlic salt, seasoned salt, table salt, and sea salt. Worcestershire sauce. Tartar sauce. Barbecue sauce. Teriyaki sauce. Soy sauce, including reduced-sodium. Steak sauce. Canned and packaged gravies. Fish sauce. Oyster sauce. Cocktail sauce. Horseradish that you find on the shelf. Ketchup. Mustard. Meat flavorings and tenderizers. Bouillon cubes. Hot sauce and Tabasco sauce. Premade or packaged marinades. Premade or packaged taco seasonings. Relishes. Regular salad dressings. Where to find more information:  National Heart, Lung, and [place]: https://wilson-eaton.com/  American Heart Association: www.heart.org Summary  The DASH eating plan is a healthy eating plan that has been shown to reduce high blood pressure (hypertension). It may also reduce  your risk for type 2 diabetes, heart disease, and stroke.  With the DASH eating plan, you should limit salt (sodium) intake to 2,300 mg a day. If you have hypertension, you may need to reduce your sodium intake to 1,500 mg a day.  When on the DASH eating plan, aim to eat more fresh fruits and vegetables, whole grains, lean proteins, low-fat dairy, and heart-healthy fats.  Work with your health care provider or diet and nutrition specialist (dietitian) to adjust your eating plan to your individual calorie needs. This information is not intended to  replace advice given to you by your health care provider. Make sure you discuss any questions you have with your health care provider. Document Released: 02/22/2011 Document Revised: 02/27/2016 Document Reviewed: 02/27/2016 Elsevier Interactive Patient Education  2017 [employer].

## 2016-06-04 NOTE — Progress Notes (Signed)
Preventive Screening-Counseling & Management   Patient present here today for a Medicare annual wellness visit.   Current Problems (verified)   Medications Prior to Visit Allergies (verified)   PAST HISTORY  Family History (verified)   Social History Married, 2 children, retired from [employer]  Current exercise habits: Has gotten out of the routine lately but is planning on starting back walking on the treadmill several days a week   Dietary issues discussed: Encouraged to limit fried fatty foods and increase fresh fruits and vegetables   Cardiac risk factors: Only hypertension   Depression Screen  (Note: if answer to either of the following is "Yes", a more complete depression screening is indicated)   Over the past two weeks, have you felt down, depressed or hopeless? No  Over the past two weeks, have you felt little interest or pleasure in doing things? No  Have you lost interest or pleasure in daily life? No  Do you often feel hopeless? No  Do you cry easily over simple problems? No   Activities of Daily Living  In your present state of health, do you have any difficulty performing the following activities?  Driving?: No Managing money?: No Feeding yourself?:No Getting from bed to chair?:No Climbing a flight of stairs?:No Preparing food and eating?:No Bathing or showering?:No Getting dressed?:No Getting to the toilet?:No Using the toilet?:No Moving around from place to place?: No  Fall Risk Assessment In the past year have you fallen or had a near fall?:No Are you currently taking any medications that make you dizzy?:No   Hearing Difficulties: No Do you often ask people to speak up or repeat themselves?:No Do you experience ringing or noises in your ears?: has ringing in the ears often in the right ear more than the left   Do you have difficulty understanding soft or whispered voices?:No  Cognitive Testing   Alert? Yes Normal Appearance?Yes  Oriented to person? Yes Place? Yes  Time? Yes  Displays appropriate judgment?Yes  Can read the correct time from a watch face? yes Are you having problems remembering things?Nothing concerning   Advanced Directives have been discussed with the patient?Yes    List the Names of Other Physician/Practitioners you currently use: Dr Zigmund Daniel (opth)    Indicate any recent Medical Services you may have received from other than [facility] providers in the past year (date may be approximate).     Medicare Attestation  I have personally reviewed:  The patient's medical and social history  Their use of alcohol, tobacco or illicit drugs  Their current medications and supplements  The patient's functional ability including ADLs,fall risks, home safety risks, cognitive, and hearing and visual impairment  Diet and physical activities  Evidence for depression or mood disorders  The patient's weight, height, BMI, and visual acuity have been recorded in the chart. I have made referrals, counseling, and provided education to the patient based on review of the above and I have provided the patient with a written personalized care plan for preventive services.    Physical Exam BP (!) 160/80   Pulse 75   Resp 16   Ht 5\' 4"  (1.626 m)   Wt 141 lb (64 kg)   SpO2 98%   BMI 24.20 kg/m    Assessment & Plan:  Medicare annual wellness visit, subsequent Annual exam as documented. Counseling done  re healthy lifestyle involving commitment to 150 minutes exercise per week, heart healthy diet, and attaining  healthy weight.The importance of adequate sleep also discussed. Regular seat belt use and home safety, is also discussed. Changes in health habits are decided on by the patient with goals and time frames  set for achieving them. Immunization and cancer screening needs are specifically addressed at this visit.   Essential hypertension Markedly elevated and uncontrolled  at visit, reports forgetting to take medication for previous 2 days, will take meds as prescribed and return for nurse BP check in 2 weeks DASH diet and commitment to daily physical activity for a minimum of 30 minutes discussed and encouraged, as a part of hypertension management. The importance of attaining a healthy weight is also discussed.  BP/Weight 06/04/2016 09/27/2015 08/23/2015 05/03/2015 03/15/2015 10/05/2014 4/70/9295  Systolic BP 747 340 370 964 [phone]  Diastolic BP 90 82 72 90 78 82 80  Wt. (Lbs) 141 137 137.5 140.6 143 135.8 139  BMI 24.2 23.5 23.59 24.12 26.15 24.83 25.42

## 2016-06-05 NOTE — Assessment & Plan Note (Signed)

## 2016-06-05 NOTE — Assessment & Plan Note (Signed)
Markedly elevated and uncontrolled at visit, reports forgetting to take medication for previous 2 days, will take meds as prescribed and return for nurse BP check in 2 weeks DASH diet and commitment to daily physical activity for a minimum of 30 minutes discussed and encouraged, as a part of hypertension management. The importance of attaining a healthy weight is also discussed.  BP/Weight 06/04/2016 09/27/2015 08/23/2015 05/03/2015 03/15/2015 10/05/2014 8/67/6195  Systolic BP 093 267 124 580 998 338 250  Diastolic BP 90 82 72 90 78 82 80  Wt. (Lbs) 141 137 137.5 140.6 143 135.8 139  BMI 24.2 23.5 23.59 24.12 26.15 24.83 25.42

## 2016-06-06 ENCOUNTER — Encounter: Payer: Self-pay | Admitting: Family Medicine

## 2016-06-06 LAB — LIPID PANEL
CHOL/HDL RATIO: 2.6 ratio (ref <5.0)
CHOLESTEROL: 201 mg/dL — AB (ref <200)
HDL: 77 mg/dL (ref >50)
LDL Cholesterol: 110 mg/dL — ABNORMAL HIGH (ref <100)
TRIGLYCERIDES: 69 mg/dL (ref <150)
VLDL: 14 mg/dL (ref <30)

## 2016-06-06 LAB — COMPREHENSIVE METABOLIC PANEL
ALBUMIN: 4 g/dL (ref 3.6–5.1)
ALK PHOS: 75 U/L (ref 33–130)
ALT: 8 U/L (ref 6–29)
AST: 13 U/L (ref 10–35)
BILIRUBIN TOTAL: 0.5 mg/dL (ref 0.2–1.2)
BUN: 10 mg/dL (ref 7–25)
CALCIUM: 9.6 mg/dL (ref 8.6–10.4)
CO2: 28 mmol/L (ref 20–31)
Chloride: 102 mmol/L (ref 98–110)
Creat: 1.03 mg/dL — ABNORMAL HIGH (ref 0.50–0.99)
Glucose, Bld: 77 mg/dL (ref 65–99)
POTASSIUM: 4.5 mmol/L (ref 3.5–5.3)
Sodium: 136 mmol/L (ref 135–146)
Total Protein: 7.2 g/dL (ref 6.1–8.1)

## 2016-06-06 LAB — VITAMIN D 25 HYDROXY (VIT D DEFICIENCY, FRACTURES): VIT D 25 HYDROXY: 44 ng/mL (ref 30–100)

## 2016-06-06 LAB — TSH: TSH: 1.14 mIU/L

## 2016-06-14 ENCOUNTER — Encounter (INDEPENDENT_AMBULATORY_CARE_PROVIDER_SITE_OTHER): Payer: Medicare Other | Admitting: Ophthalmology

## 2016-06-14 DIAGNOSIS — H34831 Tributary (branch) retinal vein occlusion, right eye, with macular edema: Secondary | ICD-10-CM | POA: Diagnosis not present

## 2016-06-14 DIAGNOSIS — H35033 Hypertensive retinopathy, bilateral: Secondary | ICD-10-CM | POA: Diagnosis not present

## 2016-06-14 DIAGNOSIS — H43813 Vitreous degeneration, bilateral: Secondary | ICD-10-CM

## 2016-06-14 DIAGNOSIS — I10 Essential (primary) hypertension: Secondary | ICD-10-CM

## 2016-06-18 ENCOUNTER — Ambulatory Visit: Payer: Medicare Other

## 2016-06-18 ENCOUNTER — Other Ambulatory Visit: Payer: Self-pay | Admitting: Family Medicine

## 2016-06-18 VITALS — BP 152/78

## 2016-06-18 DIAGNOSIS — I10 Essential (primary) hypertension: Secondary | ICD-10-CM

## 2016-06-18 MED ORDER — AMLODIPINE BESYLATE 2.5 MG PO TABS: 2.5000 mg | ORAL_TABLET | Freq: Every day | ORAL | 3 refills | Status: DC

## 2016-06-18 NOTE — Patient Instructions (Signed)
Schedule followup in June   Start amlodipine 2.5mg  daily in addition to your other medications. This has been sent to your pharmacy.

## 2016-06-18 NOTE — Progress Notes (Signed)
Amlodipine 2.5 daily sent in. Coming back for June appt

## 2016-06-25 DIAGNOSIS — H401131 Primary open-angle glaucoma, bilateral, mild stage: Secondary | ICD-10-CM | POA: Diagnosis not present

## 2016-07-12 ENCOUNTER — Encounter (INDEPENDENT_AMBULATORY_CARE_PROVIDER_SITE_OTHER): Payer: Medicare Other | Admitting: Ophthalmology

## 2016-07-12 DIAGNOSIS — H34831 Tributary (branch) retinal vein occlusion, right eye, with macular edema: Secondary | ICD-10-CM | POA: Diagnosis not present

## 2016-07-12 DIAGNOSIS — H43813 Vitreous degeneration, bilateral: Secondary | ICD-10-CM | POA: Diagnosis not present

## 2016-07-12 DIAGNOSIS — H35033 Hypertensive retinopathy, bilateral: Secondary | ICD-10-CM

## 2016-07-12 DIAGNOSIS — H2513 Age-related nuclear cataract, bilateral: Secondary | ICD-10-CM

## 2016-07-12 DIAGNOSIS — I10 Essential (primary) hypertension: Secondary | ICD-10-CM

## 2016-07-17 ENCOUNTER — Other Ambulatory Visit: Payer: Self-pay | Admitting: Family Medicine

## 2016-07-17 DIAGNOSIS — H2513 Age-related nuclear cataract, bilateral: Secondary | ICD-10-CM | POA: Diagnosis not present

## 2016-07-17 DIAGNOSIS — H401112 Primary open-angle glaucoma, right eye, moderate stage: Secondary | ICD-10-CM | POA: Diagnosis not present

## 2016-07-18 ENCOUNTER — Telehealth: Payer: Self-pay

## 2016-07-18 DIAGNOSIS — H2513 Age-related nuclear cataract, bilateral: Secondary | ICD-10-CM | POA: Diagnosis not present

## 2016-07-18 DIAGNOSIS — H401112 Primary open-angle glaucoma, right eye, moderate stage: Secondary | ICD-10-CM | POA: Diagnosis not present

## 2016-07-18 NOTE — Telephone Encounter (Signed)
Verbal order given to patient's eye dr for ok per dr for him to prescribe diamox.

## 2016-07-20 DIAGNOSIS — H401112 Primary open-angle glaucoma, right eye, moderate stage: Secondary | ICD-10-CM | POA: Diagnosis not present

## 2016-08-20 ENCOUNTER — Encounter: Payer: Self-pay | Admitting: Family Medicine

## 2016-08-20 ENCOUNTER — Ambulatory Visit (INDEPENDENT_AMBULATORY_CARE_PROVIDER_SITE_OTHER): Payer: Medicare Other | Admitting: Family Medicine

## 2016-08-20 VITALS — BP 120/78 | HR 79 | Resp 16 | Ht 64.0 in | Wt 135.0 lb

## 2016-08-20 DIAGNOSIS — E784 Other hyperlipidemia: Secondary | ICD-10-CM | POA: Diagnosis not present

## 2016-08-20 DIAGNOSIS — I10 Essential (primary) hypertension: Secondary | ICD-10-CM | POA: Diagnosis not present

## 2016-08-20 DIAGNOSIS — E7849 Other hyperlipidemia: Secondary | ICD-10-CM

## 2016-08-20 DIAGNOSIS — J3089 Other allergic rhinitis: Secondary | ICD-10-CM

## 2016-08-20 DIAGNOSIS — F5101 Primary insomnia: Secondary | ICD-10-CM

## 2016-08-20 NOTE — Patient Instructions (Addendum)
Annual; Physical exam in 4.5 months , call if you need me before  No changes in medication    Fasting CBC, chem 7 and EGFr one week before next visit  Please work on good  health habits so that your health will improve. 1. Commitment to daily physical activity for 30 to 60  minutes, if you are able to do this.  2. Commitment to wise food choices. Aim for half of your  food intake to be vegetable and fruit, one quarter starchy foods, and one quarter protein. Try to eat on a regular schedule  3 meals per day, snacking between meals should be limited to vegetables or fruits or small portions of nuts. 64 ounces of water per day is generally recommended, unless you have specific health conditions, like heart failure or kidney failure where you will need to limit fluid intake.  3. Commitment to sufficient and a  good quality of physical and mental rest daily, generally between 6 to 8 hours per day.  WITH PERSISTANCE AND PERSEVERANCE, THE IMPOSSIBLE , BECOMES THE NORM! Thank you  for choosing [facility]. We consider it a privelige to serve you.  Delivering excellent health care in a caring and  compassionate way is our goal.  Partnering with you,  so that together we can achieve this goal is our strategy.

## 2016-08-21 ENCOUNTER — Encounter (INDEPENDENT_AMBULATORY_CARE_PROVIDER_SITE_OTHER): Payer: Medicare Other | Admitting: Ophthalmology

## 2016-08-21 DIAGNOSIS — H34831 Tributary (branch) retinal vein occlusion, right eye, with macular edema: Secondary | ICD-10-CM | POA: Diagnosis not present

## 2016-08-21 DIAGNOSIS — I10 Essential (primary) hypertension: Secondary | ICD-10-CM

## 2016-08-21 DIAGNOSIS — H43813 Vitreous degeneration, bilateral: Secondary | ICD-10-CM

## 2016-08-21 DIAGNOSIS — H2513 Age-related nuclear cataract, bilateral: Secondary | ICD-10-CM | POA: Diagnosis not present

## 2016-08-21 DIAGNOSIS — H35033 Hypertensive retinopathy, bilateral: Secondary | ICD-10-CM

## 2016-08-21 NOTE — Assessment & Plan Note (Signed)
Controlled, no change in medication DASH diet and commitment to daily physical activity for a minimum of 30 minutes discussed and encouraged, as a part of hypertension management. The importance of attaining a healthy weight is also discussed.  BP/Weight 08/20/2016 06/18/2016 06/04/2016 09/27/2015 08/23/2015 05/03/2015 92/23/0097  Systolic BP 949 971 820 [phone]  Diastolic BP 78 78 90 82 72 90 78  Wt. (Lbs) 135 - 141 137 137.5 140.6 143  BMI 23.17 - 24.2 23.5 23.59 24.12 26.15

## 2016-08-21 NOTE — Assessment & Plan Note (Signed)
No current flare uses meds as needed

## 2016-08-21 NOTE — Progress Notes (Signed)
   ARLITA BUFFKIN     MRN: [id number]      DOB: 09-24-1945   HPI Ms. Quinteros is here for follow up and re-evaluation of chronic medical conditions, medication management and review of any available recent lab and radiology data.  Preventive health is updated, specifically  Cancer screening and Immunization.   Questions or concerns regarding consultations or procedures which the PT has had in the interim are  Addressed.recently started on diamox for eyes no adverse s/e The PT denies any adverse reactions to current medications since the last visit.  There are no new concerns.  There are no specific complaints   ROS Denies recent fever or chills. Denies sinus pressure, nasal congestion, ear pain or sore throat. Denies chest congestion, productive cough or wheezing. Denies chest pains, palpitations and leg swelling Denies abdominal pain, nausea, vomiting,diarrhea or constipation.   Denies dysuria, frequency, hesitancy or incontinence. Denies joint pain, swelling and limitation in mobility. Denies headaches, seizures, numbness, or tingling. Denies depression, anxiety or insomnia. Denies skin break down or rash.   PE  BP 120/78   Pulse 79   Resp 16   Ht 5\' 4"  (1.626 m)   Wt 135 lb (61.2 kg)   SpO2 100%   BMI 23.17 kg/m   Patient alert and oriented and in no cardiopulmonary distress.  HEENT: No facial asymmetry, EOMI,   oropharynx pink and moist.  Neck supple no JVD, no mass.  Chest: Clear to auscultation bilaterally.  CVS: S1, S2 no murmurs, no S3.Regular rate.  ABD: Soft non tender.   Ext: No edema  MS: Adequate ROM spine, shoulders, hips and knees.  Skin: Intact, no ulcerations or rash noted.  Psych: Good eye contact, normal affect. Memory intact not anxious or depressed appearing.  CNS: CN 2-12 intact, power,  normal throughout.no focal deficits noted.   Assessment & Plan  Essential hypertension Controlled, no change in medication DASH diet and commitment  to daily physical activity for a minimum of 30 minutes discussed and encouraged, as a part of hypertension management. The importance of attaining a healthy weight is also discussed.  BP/Weight 08/20/2016 06/18/2016 06/04/2016 09/27/2015 08/23/2015 05/03/2015 05/17/3141  Systolic BP 888 757 972 820 601 561 537  Diastolic BP 78 78 90 82 72 90 78  Wt. (Lbs) 135 - 141 137 137.5 140.6 143  BMI 23.17 - 24.2 23.5 23.59 24.12 26.15       Seasonal allergies No current flare uses meds as needed  Insomnia Controlled, no change in medication Sleep hygiene reviewed  Prescription sent for  medication needed.   Hyperlipidemia Low fat diet reviewed , rept lab in 12 months

## 2016-08-21 NOTE — Assessment & Plan Note (Addendum)
Controlled, no change in medication ?Sleep hygiene reviewed  ?Prescription sent for  medication needed. ? ?

## 2016-08-21 NOTE — Assessment & Plan Note (Signed)
Low fat diet reviewed , rept lab in 12 months

## 2016-08-24 DIAGNOSIS — H401112 Primary open-angle glaucoma, right eye, moderate stage: Secondary | ICD-10-CM | POA: Diagnosis not present

## 2016-08-24 DIAGNOSIS — H25813 Combined forms of age-related cataract, bilateral: Secondary | ICD-10-CM | POA: Diagnosis not present

## 2016-09-03 ENCOUNTER — Encounter (INDEPENDENT_AMBULATORY_CARE_PROVIDER_SITE_OTHER): Payer: Self-pay | Admitting: Internal Medicine

## 2016-09-03 ENCOUNTER — Ambulatory Visit (INDEPENDENT_AMBULATORY_CARE_PROVIDER_SITE_OTHER): Payer: Medicare Other | Admitting: Internal Medicine

## 2016-09-03 VITALS — BP 110/74 | HR 72 | Temp 98.4°F | Ht 62.0 in | Wt 133.8 lb

## 2016-09-03 DIAGNOSIS — B182 Chronic viral hepatitis C: Secondary | ICD-10-CM | POA: Diagnosis not present

## 2016-09-03 LAB — CBC WITH DIFFERENTIAL/PLATELET
BASOS PCT: 1 %
Basophils Absolute: 46 cells/uL (ref 0–200)
Eosinophils Absolute: 46 cells/uL (ref 15–500)
Eosinophils Relative: 1 %
HCT: 39.5 % (ref 35.0–45.0)
Hemoglobin: 12.8 g/dL (ref 11.7–15.5)
Lymphocytes Relative: 54 %
Lymphs Abs: 2484 cells/uL (ref 850–3900)
MCH: 30.3 pg (ref 27.0–33.0)
MCHC: 32.4 g/dL (ref 32.0–36.0)
MCV: 93.4 fL (ref 80.0–100.0)
MONOS PCT: 5 %
MPV: 9.8 fL (ref 7.5–12.5)
Monocytes Absolute: 230 cells/uL (ref 200–950)
NEUTROS PCT: 39 %
Neutro Abs: 1794 cells/uL (ref 1500–7800)
PLATELETS: 270 10*3/uL (ref 140–400)
RBC: 4.23 MIL/uL (ref 3.80–5.10)
RDW: 13.7 % (ref 11.0–15.0)
WBC: 4.6 10*3/uL (ref 3.8–10.8)

## 2016-09-03 LAB — HEPATIC FUNCTION PANEL
ALT: 10 U/L (ref 6–29)
AST: 16 U/L (ref 10–35)
Albumin: 4.2 g/dL (ref 3.6–5.1)
Alkaline Phosphatase: 79 U/L (ref 33–130)
BILIRUBIN DIRECT: 0.2 mg/dL (ref <=0.2)
BILIRUBIN INDIRECT: 0.8 mg/dL (ref 0.2–1.2)
TOTAL PROTEIN: 7.9 g/dL (ref 6.1–8.1)
Total Bilirubin: 1 mg/dL (ref 0.2–1.2)

## 2016-09-03 NOTE — Progress Notes (Signed)
Subjective:    Patient ID: Christina Miles, female    DOB: [birth year], 71 y.o.   MRN: [id number]  HPI  Here today for f/u. Hx of Hepatitis C. Genotype 2B.  Treated with Epclusa in 2017.  09/02/2015 Hep C quaint undetected.  She tells me she is doing good. She is traveling. Appetite is good. No weight loss. She has been having trouble with her vision, (blurred, but this has cleared). BMs are normal.     CBC    Component Value Date/Time   WBC 5.1 09/02/2015 1304   RBC 4.23 09/02/2015 1304   HGB 12.6 09/02/2015 1304   HCT 38.9 09/02/2015 1304   PLT 314 09/02/2015 1304   MCV 92.0 09/02/2015 1304   MCH 29.8 09/02/2015 1304   MCHC 32.4 09/02/2015 1304   RDW 13.6 09/02/2015 1304   LYMPHSABS 2.3 04/18/2015 1007   MONOABS 0.4 04/18/2015 1007   EOSABS 0.1 04/18/2015 1007   BASOSABS 0.1 04/18/2015 1007   Hepatic Function Latest Ref Rng & Units 06/04/2016 09/02/2015 04/18/2015  Total Protein 6.1 - 8.1 g/dL 7.2 7.5 7.5  Albumin 3.6 - 5.1 g/dL 4.0 4.1 4.1  AST 10 - 35 U/L '13 17 12  '$ ALT 6 - 29 U/L '8 14 11  '$ Alk Phosphatase 33 - 130 U/L 75 81 91  Total Bilirubin 0.2 - 1.2 mg/dL 0.5 0.6 0.7  Bilirubin, Direct 0.0 - 0.3 mg/dL - - -     Review of Systems Past Medical History:  Diagnosis Date  . Depression 2014  . Hepatitis   . Hyperlipidemia   . Hypertension   . Reduced vision 2015   right, had hemmorhage, treated by [name], injections    Past Surgical History:  Procedure Laterality Date  . ABDOMINAL HYSTERECTOMY  1985   . ? mass on ovary  . BACK SURGERY    . CHOLECYSTECTOMY    . COLONOSCOPY  02/20/2012   Procedure: COLONOSCOPY;  Surgeon: Rogene Houston, MD;  Location: [facility];  Service: Endoscopy;  Laterality: N/A;  930  . COLONOSCOPY     Patient states that this was 3-4 years ago [facility]   . [place] approx   [place]    Allergies  Allergen Reactions  . Ace Inhibitors Cough    Current Outpatient Prescriptions on File Prior to Visit  Medication Sig  Dispense Refill  . amLODipine (NORVASC) 2.5 MG tablet Take 1 tablet (2.5 mg total) by mouth daily. 30 tablet 3  . aspirin 81 MG tablet Take 81 mg by mouth daily.    . brimonidine (ALPHAGAN) 0.15 % ophthalmic solution Place 1 drop into the right eye 2 (two) times daily.    . Calcium Carbonate-Vitamin D (CALTRATE 600+D) 600-400 MG-UNIT per tablet Take 2 tablets by mouth daily.    . dorzolamide (TRUSOPT) 2 % ophthalmic solution Place 1 drop into the right eye 2 (two) times daily.    . fluticasone (FLONASE) 50 MCG/ACT nasal spray PLACE 2 SPRAYS INTO THE NOSE DAILY. 16 g 5  . hydrochlorothiazide (HYDRODIURIL) 25 MG tablet TAKE ONE TABLET BY MOUTH ONCE DAILY. 90 tablet 1  . KLOR-CON M10 10 MEQ tablet TAKE 1 TABLET BY MOUTH TWICE A DAY 180 tablet 1  . latanoprost (XALATAN) 0.005 % ophthalmic solution Place 1 drop into both eyes at bedtime.    . mirtazapine (REMERON) 7.5 MG tablet TAKE ONE TABLET BY MOUTH AT BEDTIME. 90 tablet 1   No current facility-administered medications on file prior to  visit.         Objective:   Physical Exam.Blood pressure 110/74, pulse 72, temperature 98.4 F (36.9 C), height '5\' 2"'$  (1.575 m), weight 133 lb 12.8 oz (60.7 kg). Alert and oriented. Skin warm and dry. Oral mucosa is moist.   . Sclera anicteric, conjunctivae is pink. Thyroid not enlarged. No cervical lymphadenopathy. Lungs clear. Heart regular rate and rhythm.  Abdomen is soft. Bowel sounds are positive. No hepatomegaly. No abdominal masses felt. No tenderness.  No edema to lower extremities. Patient is alert and oriented.         Assessment & Plan:  Hepatitis C. She is doing well. Needs US abdomen. CBC, Hepatic and Hep C quaint.

## 2016-09-03 NOTE — Patient Instructions (Signed)
OV in 1 year.  

## 2016-09-06 LAB — HEPATITIS C RNA QUANTITATIVE
HCV QUANT LOG: NOT DETECTED {Log_IU}/mL
HCV QUANT: NOT DETECTED [IU]/mL

## 2016-09-13 ENCOUNTER — Ambulatory Visit (HOSPITAL_COMMUNITY)
Admission: RE | Admit: 2016-09-13 | Discharge: 2016-09-13 | Disposition: A | Discharge status: Home / Self Care | Payer: Medicare Other | Source: Ambulatory Visit | Attending: Internal Medicine | Admitting: Internal Medicine

## 2016-09-13 DIAGNOSIS — B182 Chronic viral hepatitis C: Secondary | ICD-10-CM

## 2016-09-13 DIAGNOSIS — N270 Small kidney, unilateral: Secondary | ICD-10-CM | POA: Insufficient documentation

## 2016-09-13 DIAGNOSIS — Z1289 Encounter for screening for malignant neoplasm of other sites: Secondary | ICD-10-CM | POA: Diagnosis not present

## 2016-09-14 ENCOUNTER — Other Ambulatory Visit: Payer: Self-pay | Admitting: Family Medicine

## 2016-09-14 DIAGNOSIS — F32A Depression, unspecified: Secondary | ICD-10-CM

## 2016-09-14 DIAGNOSIS — F329 Major depressive disorder, single episode, unspecified: Secondary | ICD-10-CM

## 2016-09-17 NOTE — Telephone Encounter (Signed)
Seen [phone] 

## 2016-09-20 NOTE — Progress Notes (Signed)
Patient was given an appointment for 09/12/16 at 1:45pm.  A letter was mailed to the patient.

## 2016-10-09 ENCOUNTER — Encounter (INDEPENDENT_AMBULATORY_CARE_PROVIDER_SITE_OTHER): Payer: Medicare Other | Admitting: Ophthalmology

## 2016-10-09 DIAGNOSIS — H2513 Age-related nuclear cataract, bilateral: Secondary | ICD-10-CM | POA: Diagnosis not present

## 2016-10-09 DIAGNOSIS — H34831 Tributary (branch) retinal vein occlusion, right eye, with macular edema: Secondary | ICD-10-CM

## 2016-10-09 DIAGNOSIS — H35033 Hypertensive retinopathy, bilateral: Secondary | ICD-10-CM | POA: Diagnosis not present

## 2016-10-09 DIAGNOSIS — H43813 Vitreous degeneration, bilateral: Secondary | ICD-10-CM | POA: Diagnosis not present

## 2016-10-09 DIAGNOSIS — I10 Essential (primary) hypertension: Secondary | ICD-10-CM | POA: Diagnosis not present

## 2016-11-15 ENCOUNTER — Encounter: Payer: Medicare Other | Admitting: Family Medicine

## 2016-11-27 ENCOUNTER — Encounter (INDEPENDENT_AMBULATORY_CARE_PROVIDER_SITE_OTHER): Payer: Medicare Other | Admitting: Ophthalmology

## 2016-11-27 DIAGNOSIS — H35033 Hypertensive retinopathy, bilateral: Secondary | ICD-10-CM | POA: Diagnosis not present

## 2016-11-27 DIAGNOSIS — H34831 Tributary (branch) retinal vein occlusion, right eye, with macular edema: Secondary | ICD-10-CM

## 2016-11-27 DIAGNOSIS — I10 Essential (primary) hypertension: Secondary | ICD-10-CM | POA: Diagnosis not present

## 2016-11-27 DIAGNOSIS — H43813 Vitreous degeneration, bilateral: Secondary | ICD-10-CM | POA: Diagnosis not present

## 2016-12-15 ENCOUNTER — Other Ambulatory Visit: Payer: Self-pay | Admitting: Family Medicine

## 2016-12-15 DIAGNOSIS — F329 Major depressive disorder, single episode, unspecified: Secondary | ICD-10-CM

## 2016-12-15 DIAGNOSIS — F32A Depression, unspecified: Secondary | ICD-10-CM

## 2016-12-25 DIAGNOSIS — H401131 Primary open-angle glaucoma, bilateral, mild stage: Secondary | ICD-10-CM | POA: Diagnosis not present

## 2017-01-01 DIAGNOSIS — H401112 Primary open-angle glaucoma, right eye, moderate stage: Secondary | ICD-10-CM | POA: Diagnosis not present

## 2017-01-01 DIAGNOSIS — H401121 Primary open-angle glaucoma, left eye, mild stage: Secondary | ICD-10-CM | POA: Diagnosis not present

## 2017-01-02 DIAGNOSIS — I10 Essential (primary) hypertension: Secondary | ICD-10-CM | POA: Diagnosis not present

## 2017-01-02 LAB — BASIC METABOLIC PANEL WITH GFR
BUN / CREAT RATIO: 13 (calc) (ref 6–22)
BUN: 14 mg/dL (ref 7–25)
CALCIUM: 9.7 mg/dL (ref 8.6–10.4)
CO2: 29 mmol/L (ref 20–32)
Chloride: 104 mmol/L (ref 98–110)
Creat: 1.05 mg/dL — ABNORMAL HIGH (ref 0.50–0.99)
GFR, EST AFRICAN AMERICAN: 63 mL/min/{1.73_m2} (ref >=60)
GFR, EST NON AFRICAN AMERICAN: 54 mL/min/{1.73_m2} — AB (ref >=60)
Glucose, Bld: 86 mg/dL (ref 65–99)
POTASSIUM: 3.5 mmol/L (ref 3.5–5.3)
SODIUM: 141 mmol/L (ref 135–146)

## 2017-01-02 LAB — CBC
HEMATOCRIT: 39.9 % (ref 35.0–45.0)
Hemoglobin: 13.4 g/dL (ref 11.7–15.5)
MCH: 30.5 pg (ref 27.0–33.0)
MCHC: 33.6 g/dL (ref 32.0–36.0)
MCV: 90.7 fL (ref 80.0–100.0)
MPV: 10.1 fL (ref 7.5–12.5)
Platelets: 276 10*3/uL (ref 140–400)
RBC: 4.4 10*6/uL (ref 3.80–5.10)
RDW: 12.2 % (ref 11.0–15.0)
WBC: 4.1 10*3/uL (ref 3.8–10.8)

## 2017-01-03 ENCOUNTER — Ambulatory Visit (INDEPENDENT_AMBULATORY_CARE_PROVIDER_SITE_OTHER): Payer: Medicare Other | Admitting: Family Medicine

## 2017-01-03 ENCOUNTER — Encounter: Payer: Self-pay | Admitting: Family Medicine

## 2017-01-03 VITALS — BP 118/68 | HR 74 | Temp 99.3°F | Resp 16 | Ht 62.0 in | Wt 134.5 lb

## 2017-01-03 DIAGNOSIS — Z1231 Encounter for screening mammogram for malignant neoplasm of breast: Secondary | ICD-10-CM | POA: Diagnosis not present

## 2017-01-03 DIAGNOSIS — Z23 Encounter for immunization: Secondary | ICD-10-CM | POA: Diagnosis not present

## 2017-01-03 DIAGNOSIS — Z0000 Encounter for general adult medical examination without abnormal findings: Secondary | ICD-10-CM

## 2017-01-03 DIAGNOSIS — I10 Essential (primary) hypertension: Secondary | ICD-10-CM | POA: Diagnosis not present

## 2017-01-03 DIAGNOSIS — E7849 Other hyperlipidemia: Secondary | ICD-10-CM

## 2017-01-03 DIAGNOSIS — Z1211 Encounter for screening for malignant neoplasm of colon: Secondary | ICD-10-CM | POA: Diagnosis not present

## 2017-01-03 LAB — HEMOCCULT GUIAC POC 1CARD (OFFICE): Fecal Occult Blood, POC: NEGATIVE

## 2017-01-03 MED ORDER — BENZONATATE 100 MG PO CAPS: 100.0000 mg | ORAL_CAPSULE | Freq: Two times a day (BID) | ORAL | 0 refills | Status: DC | PRN

## 2017-01-03 NOTE — Assessment & Plan Note (Signed)

## 2017-01-03 NOTE — Patient Instructions (Addendum)
[place] visit with MD ilast week in March [facility]), call if you need me before  Please schedule mammogram which is due at checkout  Excellent exam  No medication change  Flu vaccine today  Fasting lipid, chem 7 and TSH 3rd week in March  Thank you  for choosing [facility]. We consider it a privelige to serve you.  Delivering excellent health care in a caring and  compassionate way is our goal.  Partnering with you,  so that together we can achieve this goal is our strategy.   It is important that you exercise regularly at least 30 minutes 5 times a week. If you develop chest pain, have severe difficulty breathing, or feel very tired, stop exercising immediately and seek medical attention

## 2017-01-04 NOTE — Progress Notes (Signed)
    Christina Miles     MRN: [id number]      DOB: 07-Nov-1945  HPI: Patient is in for annual physical exam. No other health concerns are expressed or addressed at the visit. Recent labs, if available are reviewed. Immunization is reviewed , and  updated    PE: BP 118/68 (BP Location: Left Arm, Patient Position: Sitting, Cuff Size: Normal)   Pulse 74   Temp 99.3 F (37.4 C) (Other (Comment))   Resp 16   Ht 5\' 2"  (1.575 m)   Wt 134 lb 8 oz (61 kg)   SpO2 97%   BMI 24.60 kg/m   Pleasant  female, alert and oriented x 3, in no cardio-pulmonary distress. Afebrile. HEENT No facial trauma or asymetry. Sinuses non tender.  Extra occullar muscles intact, pupils equally reactive to light. External ears normal, tympanic membranes clear. Oropharynx moist, no exudate. Neck: supple, no adenopathy,JVD or thyromegaly.No bruits.  Chest: Clear to ascultation bilaterally.No crackles or wheezes. Non tender to palpation  Breast: No asymetry,no masses or lumps. No tenderness. No nipple discharge or inversion. No axillary or supraclavicular adenopathy  Cardiovascular system; Heart sounds normal,  S1 and  S2 ,no S3.  No murmur, or thrill. Apical beat not displaced Peripheral pulses normal.  Abdomen: Soft, non tender, no organomegaly or masses. No bruits. Bowel sounds normal. No guarding, tenderness or rebound.  Rectal:  Normal sphincter tone. No rectal mass. Guaiac negative stool.  GU: Not examined  Musculoskeletal exam: Full ROM of spine, hips , shoulders and knees. No deformity ,swelling or crepitus noted. No muscle wasting or atrophy.   Neurologic: Cranial nerves 2 to 12 intact. Power, tone ,sensation and reflexes normal throughout. No disturbance in gait. No tremor.  Skin: Intact, no ulceration, erythema , scaling or rash noted. Pigmentation normal throughout  Psych; Normal mood and affect. Judgement and concentration normal   Assessment & Plan:  Annual  physical exam Annual exam as documented. Counseling done  re healthy lifestyle involving commitment to 150 minutes exercise per week, heart healthy diet, and attaining healthy weight.The importance of adequate sleep also discussed. Regular seat belt use and home safety, is also discussed. Changes in health habits are decided on by the patient with goals and time frames  set for achieving them. Immunization and cancer screening needs are specifically addressed at this visit.

## 2017-01-06 ENCOUNTER — Encounter: Payer: Self-pay | Admitting: Family Medicine

## 2017-01-21 ENCOUNTER — Ambulatory Visit (HOSPITAL_COMMUNITY)
Admission: RE | Admit: 2017-01-21 | Discharge: 2017-01-21 | Disposition: A | Discharge status: Home / Self Care | Payer: Medicare Other | Source: Ambulatory Visit | Attending: Family Medicine | Admitting: Family Medicine

## 2017-01-21 DIAGNOSIS — E7849 Other hyperlipidemia: Secondary | ICD-10-CM

## 2017-01-21 DIAGNOSIS — Z23 Encounter for immunization: Secondary | ICD-10-CM

## 2017-01-21 DIAGNOSIS — Z1231 Encounter for screening mammogram for malignant neoplasm of breast: Secondary | ICD-10-CM | POA: Diagnosis not present

## 2017-01-21 DIAGNOSIS — Z0000 Encounter for general adult medical examination without abnormal findings: Secondary | ICD-10-CM

## 2017-01-21 DIAGNOSIS — I10 Essential (primary) hypertension: Secondary | ICD-10-CM

## 2017-01-21 DIAGNOSIS — Z1211 Encounter for screening for malignant neoplasm of colon: Secondary | ICD-10-CM

## 2017-01-22 ENCOUNTER — Encounter (INDEPENDENT_AMBULATORY_CARE_PROVIDER_SITE_OTHER): Payer: Medicare Other | Admitting: Ophthalmology

## 2017-01-22 DIAGNOSIS — I10 Essential (primary) hypertension: Secondary | ICD-10-CM

## 2017-01-22 DIAGNOSIS — H34831 Tributary (branch) retinal vein occlusion, right eye, with macular edema: Secondary | ICD-10-CM

## 2017-01-22 DIAGNOSIS — H43813 Vitreous degeneration, bilateral: Secondary | ICD-10-CM

## 2017-01-22 DIAGNOSIS — H2513 Age-related nuclear cataract, bilateral: Secondary | ICD-10-CM | POA: Diagnosis not present

## 2017-01-22 DIAGNOSIS — H35033 Hypertensive retinopathy, bilateral: Secondary | ICD-10-CM | POA: Diagnosis not present

## 2017-01-23 ENCOUNTER — Other Ambulatory Visit: Payer: Self-pay | Admitting: Family Medicine

## 2017-01-28 DIAGNOSIS — H2513 Age-related nuclear cataract, bilateral: Secondary | ICD-10-CM | POA: Diagnosis not present

## 2017-01-28 DIAGNOSIS — H401112 Primary open-angle glaucoma, right eye, moderate stage: Secondary | ICD-10-CM | POA: Diagnosis not present

## 2017-01-28 DIAGNOSIS — H348312 Tributary (branch) retinal vein occlusion, right eye, stable: Secondary | ICD-10-CM | POA: Diagnosis not present

## 2017-02-18 DIAGNOSIS — H401112 Primary open-angle glaucoma, right eye, moderate stage: Secondary | ICD-10-CM | POA: Diagnosis not present

## 2017-02-18 DIAGNOSIS — H2513 Age-related nuclear cataract, bilateral: Secondary | ICD-10-CM | POA: Diagnosis not present

## 2017-02-18 DIAGNOSIS — H348312 Tributary (branch) retinal vein occlusion, right eye, stable: Secondary | ICD-10-CM | POA: Diagnosis not present

## 2017-03-01 ENCOUNTER — Encounter (INDEPENDENT_AMBULATORY_CARE_PROVIDER_SITE_OTHER): Payer: Self-pay | Admitting: *Deleted

## 2017-03-09 ENCOUNTER — Other Ambulatory Visit: Payer: Self-pay | Admitting: Family Medicine

## 2017-03-13 NOTE — Telephone Encounter (Signed)
Seen 10 18 18 

## 2017-03-21 ENCOUNTER — Encounter (INDEPENDENT_AMBULATORY_CARE_PROVIDER_SITE_OTHER): Payer: Medicare Other | Admitting: Ophthalmology

## 2017-03-26 ENCOUNTER — Encounter (INDEPENDENT_AMBULATORY_CARE_PROVIDER_SITE_OTHER): Payer: Medicare Other | Admitting: Ophthalmology

## 2017-03-26 DIAGNOSIS — H43813 Vitreous degeneration, bilateral: Secondary | ICD-10-CM | POA: Diagnosis not present

## 2017-03-26 DIAGNOSIS — H35033 Hypertensive retinopathy, bilateral: Secondary | ICD-10-CM

## 2017-03-26 DIAGNOSIS — H34831 Tributary (branch) retinal vein occlusion, right eye, with macular edema: Secondary | ICD-10-CM | POA: Diagnosis not present

## 2017-03-26 DIAGNOSIS — H2513 Age-related nuclear cataract, bilateral: Secondary | ICD-10-CM

## 2017-03-26 DIAGNOSIS — I10 Essential (primary) hypertension: Secondary | ICD-10-CM | POA: Diagnosis not present

## 2017-03-27 ENCOUNTER — Encounter (INDEPENDENT_AMBULATORY_CARE_PROVIDER_SITE_OTHER): Payer: Medicare Other | Admitting: Ophthalmology

## 2017-05-20 DIAGNOSIS — H348312 Tributary (branch) retinal vein occlusion, right eye, stable: Secondary | ICD-10-CM | POA: Diagnosis not present

## 2017-05-20 DIAGNOSIS — H2513 Age-related nuclear cataract, bilateral: Secondary | ICD-10-CM | POA: Diagnosis not present

## 2017-05-20 DIAGNOSIS — H401112 Primary open-angle glaucoma, right eye, moderate stage: Secondary | ICD-10-CM | POA: Diagnosis not present

## 2017-05-23 ENCOUNTER — Ambulatory Visit: Payer: Medicare Other | Admitting: Family Medicine

## 2017-05-27 DIAGNOSIS — H348312 Tributary (branch) retinal vein occlusion, right eye, stable: Secondary | ICD-10-CM | POA: Diagnosis not present

## 2017-05-27 DIAGNOSIS — H2513 Age-related nuclear cataract, bilateral: Secondary | ICD-10-CM | POA: Diagnosis not present

## 2017-05-27 DIAGNOSIS — H401112 Primary open-angle glaucoma, right eye, moderate stage: Secondary | ICD-10-CM | POA: Diagnosis not present

## 2017-06-04 ENCOUNTER — Encounter (INDEPENDENT_AMBULATORY_CARE_PROVIDER_SITE_OTHER): Payer: Medicare Other | Admitting: Ophthalmology

## 2017-06-13 DIAGNOSIS — I10 Essential (primary) hypertension: Secondary | ICD-10-CM | POA: Diagnosis not present

## 2017-06-13 DIAGNOSIS — Z0000 Encounter for general adult medical examination without abnormal findings: Secondary | ICD-10-CM | POA: Diagnosis not present

## 2017-06-13 DIAGNOSIS — Z23 Encounter for immunization: Secondary | ICD-10-CM | POA: Diagnosis not present

## 2017-06-13 DIAGNOSIS — E7849 Other hyperlipidemia: Secondary | ICD-10-CM | POA: Diagnosis not present

## 2017-06-13 LAB — COMPLETE METABOLIC PANEL WITH GFR
AG RATIO: 1.3 (calc) (ref 1.0–2.5)
ALBUMIN MSPROF: 4.1 g/dL (ref 3.6–5.1)
ALKALINE PHOSPHATASE (APISO): 89 U/L (ref 33–130)
ALT: 10 U/L (ref 6–29)
AST: 15 U/L (ref 10–35)
BUN: 16 mg/dL (ref 7–25)
CALCIUM: 9.6 mg/dL (ref 8.6–10.4)
CO2: 30 mmol/L (ref 20–32)
CREATININE: 0.97 mg/dL (ref 0.50–0.99)
Chloride: 104 mmol/L (ref 98–110)
GFR, EST NON AFRICAN AMERICAN: 60 mL/min/{1.73_m2} (ref >=60)
GFR, Est African American: 69 mL/min/{1.73_m2} (ref >=60)
GLOBULIN: 3.1 g/dL (ref 1.9–3.7)
Glucose, Bld: 90 mg/dL (ref 65–99)
POTASSIUM: 4 mmol/L (ref 3.5–5.3)
SODIUM: 139 mmol/L (ref 135–146)
Total Bilirubin: 0.9 mg/dL (ref 0.2–1.2)
Total Protein: 7.2 g/dL (ref 6.1–8.1)

## 2017-06-13 LAB — TSH: TSH: 1.21 mIU/L (ref 0.40–4.50)

## 2017-06-13 LAB — LIPID PANEL
CHOL/HDL RATIO: 2.9 (calc) (ref <5.0)
CHOLESTEROL: 206 mg/dL — AB (ref <200)
HDL: 72 mg/dL (ref >50)
LDL CHOLESTEROL (CALC): 118 mg/dL — AB
Non-HDL Cholesterol (Calc): 134 mg/dL (calc) — ABNORMAL HIGH (ref <130)
Triglycerides: 66 mg/dL (ref <150)

## 2017-06-17 ENCOUNTER — Encounter: Payer: Self-pay | Admitting: Family Medicine

## 2017-06-17 ENCOUNTER — Other Ambulatory Visit: Payer: Self-pay | Admitting: Family Medicine

## 2017-06-17 ENCOUNTER — Ambulatory Visit (INDEPENDENT_AMBULATORY_CARE_PROVIDER_SITE_OTHER): Payer: Medicare Other | Admitting: Family Medicine

## 2017-06-17 VITALS — BP 128/80 | HR 70 | Resp 16 | Ht 62.0 in | Wt 142.0 lb

## 2017-06-17 DIAGNOSIS — F329 Major depressive disorder, single episode, unspecified: Secondary | ICD-10-CM

## 2017-06-17 DIAGNOSIS — Z0000 Encounter for general adult medical examination without abnormal findings: Secondary | ICD-10-CM

## 2017-06-17 DIAGNOSIS — F32A Depression, unspecified: Secondary | ICD-10-CM

## 2017-06-17 MED ORDER — HYDROCHLOROTHIAZIDE 25 MG PO TABS: ORAL_TABLET | ORAL | 1 refills | Status: DC

## 2017-06-17 MED ORDER — AMLODIPINE BESYLATE 2.5 MG PO TABS: 2.5000 mg | ORAL_TABLET | Freq: Every day | ORAL | 1 refills | Status: DC

## 2017-06-17 MED ORDER — POTASSIUM CHLORIDE ER 10 MEQ PO TBCR: 10.0000 meq | EXTENDED_RELEASE_TABLET | Freq: Two times a day (BID) | ORAL | 1 refills | Status: DC

## 2017-06-17 MED ORDER — MIRTAZAPINE 7.5 MG PO TABS: 7.5000 mg | ORAL_TABLET | Freq: Every day | ORAL | 1 refills | Status: DC

## 2017-06-17 NOTE — Patient Instructions (Addendum)
Annual physical exam mid October 20 or after, call if you need me sooner  Pls call for lab order in September  Colonoscopy is overdue, please have this done    All the best with your right eye surgery this week  It is important that you exercise regularly at least 30 minutes 5 times a week. If you develop chest pain, have severe difficulty breathing, or feel very tired, stop exercising immediately and seek medical attention    Weight loss goal of 7 to 10 pounds  Pls reduce fat in diet, cookies, potato chils,, increase fruit and vegetables  Thank you  for choosing [facility]. We consider it a privelige to serve you.  Delivering excellent health care in a caring and  compassionate way is our goal.  Partnering with you,  so that together we can achieve this goal is our strategy.

## 2017-06-17 NOTE — Progress Notes (Signed)
Preventive Screening-Counseling & Management   Patient present here today for a Medicare annual wellness visit.   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 Former smoker for 15 years but quit    Risk Factors  Current exercise habits:  Working on the treadmill 3 days a week, trying to increase as able   Dietary issues discussed: heart healthy encouraged    Cardiac risk factors:  Hypertension   Depression Screen  (Note: if answer to either of the following is "Yes", a more complete depression screening is indicated)   Over the past two weeks, have you felt down, depressed or hopeless? No  Over the past two weeks, have you felt little interest or pleasure in doing things? Yes, the last few weeks with her husband undergoing cancer treatment   Have you lost interest or pleasure in daily life? No  Do you often feel hopeless? No  Do you cry easily over simple problems? No, just very stressed over recent illness with her husband and her upcoming surgery   Activities of Daily Living  In your present state of health, do you have any difficulty performing the following activities?  Driving?: No Managing money?: No Feeding yourself?:No Getting from bed to chair?:No Climbing a flight of stairs?:No Preparing food and eating?:No Bathing or showering?:No Getting dressed?:No Getting to the toilet?:No Using the toilet?:No Moving around from place to place?: No  Fall Risk Assessment In the past year have you fallen or had a near fall?:No Are you currently taking any medications that make you dizzy  :No   Hearing Difficulties: No Do you often ask people to speak up or repeat themselves?:No Do you experience ringing or noises in your ears?:No Do you have difficulty understanding soft or whispered  voices?:No  Cognitive Testing  Alert? Yes Normal Appearance?Yes  Oriented to person? Yes Place? Yes  Time? Yes  Displays appropriate judgment?Yes  Can read the correct time from a watch face? yes Are you having problems remembering things?No  Advanced Directives have been discussed with the patient?Yes, has the paper work at home. No interest in further discussion at this visit   List the Names of Other Physician/Practitioners you currently use:  Dr Miguel Dibble Dr Zigmund Daniel (opth)  Indicate any recent Medical Services you may have received from other than [facility] providers in the past year (date may be approximate).     Medicare Attestation  I have personally reviewed:  The patient's medical and social history  Their use of alcohol, tobacco or illicit drugs  Their current medications and supplements  The patient's functional ability including ADLs,fall risks, home safety risks, cognitive, and hearing and visual impairment  Diet and physical activities  Evidence for depression or mood disorders  The patient's weight, height, BMI, and visual acuity have been recorded in the chart. I have made referrals, counseling, and provided education to the patient based on review of the above and I have provided the patient with a written personalized care plan for preventive services.    Physical Exam.vs  BP 128/80   Pulse 70   Resp 16   Ht 5\' 2"  (1.575 m)   Wt 142 lb (64.4 kg)   SpO2 97%   BMI 25.97 kg/m   Assessment & Plan:

## 2017-06-18 ENCOUNTER — Encounter: Payer: Self-pay | Admitting: Family Medicine

## 2017-06-18 NOTE — Assessment & Plan Note (Signed)

## 2017-06-20 DIAGNOSIS — H401112 Primary open-angle glaucoma, right eye, moderate stage: Secondary | ICD-10-CM | POA: Diagnosis not present

## 2017-09-12 ENCOUNTER — Encounter (INDEPENDENT_AMBULATORY_CARE_PROVIDER_SITE_OTHER): Payer: Self-pay | Admitting: *Deleted

## 2017-09-12 ENCOUNTER — Ambulatory Visit (INDEPENDENT_AMBULATORY_CARE_PROVIDER_SITE_OTHER): Payer: Medicare Other | Admitting: Internal Medicine

## 2017-09-12 ENCOUNTER — Telehealth (INDEPENDENT_AMBULATORY_CARE_PROVIDER_SITE_OTHER): Payer: Self-pay | Admitting: *Deleted

## 2017-09-12 ENCOUNTER — Encounter (INDEPENDENT_AMBULATORY_CARE_PROVIDER_SITE_OTHER): Payer: Self-pay | Admitting: Internal Medicine

## 2017-09-12 VITALS — BP 164/82 | HR 56 | Temp 98.0°F | Ht 62.0 in | Wt 143.0 lb

## 2017-09-12 DIAGNOSIS — Z800 Family history of malignant neoplasm of digestive organs: Secondary | ICD-10-CM | POA: Insufficient documentation

## 2017-09-12 DIAGNOSIS — Z86010 Personal history of colonic polyps: Secondary | ICD-10-CM | POA: Insufficient documentation

## 2017-09-12 DIAGNOSIS — B182 Chronic viral hepatitis C: Secondary | ICD-10-CM

## 2017-09-12 MED ORDER — PEG 3350-KCL-NA BICARB-NACL 420 G PO SOLR: 4000.0000 mL | Freq: Once | ORAL | 0 refills | Status: AC

## 2017-09-12 NOTE — Telephone Encounter (Signed)
Patient needs trilyte 

## 2017-09-12 NOTE — Patient Instructions (Signed)
Labs. OV in 1 year. 

## 2017-09-12 NOTE — Progress Notes (Signed)
Subjective:    Patient ID: Christina Miles, female    DOB: [birth year], 72 y.o.   MRN: [id number]  HPI Here today for f/u. Last seen in June of 2018. Hx of Hepatitis C that was successfully treated in 2017 with Epclusa. US abdomen 09/13/2017 revealed no abnormality of the liver.  Genotype 2B. US abdomen/elast in 2017: Metavir fibrosis score F0/F1.  09/03/2016 Hep C quaint undetected.  She tells me she is doing good. Recently had eye surgery for glaucoma. Her appetite is good. She has gained 10 pounds since her last visit. Her BMs are normal. She has no GI complaints.  Her last colonoscopy was in 2013 (family hx of colon cancer in father and 2 p[aternal uncles in their 38s). Of 3 small polyps 2 were tubular adenomas. Next colonoscopy in 5 years.  Review of Systems Past Medical History:  Diagnosis Date  . Depression 2014  . Hepatitis   . Hyperlipidemia   . Hypertension   . Reduced vision 2015   right, had hemmorhage, treated by [name], injections    Past Surgical History:  Procedure Laterality Date  . ABDOMINAL HYSTERECTOMY  1985   . ? mass on ovary  . BACK SURGERY    . CHOLECYSTECTOMY    . COLONOSCOPY  02/20/2012   Procedure: COLONOSCOPY;  Surgeon: Rogene Houston, MD;  Location: [facility];  Service: Endoscopy;  Laterality: N/A;  930  . COLONOSCOPY     Patient states that this was 3-4 years ago @[facility]   . GLAUCOMA SURGERY     05/2017  . SPINE SURGERY  1998 approx   [facility]    Allergies  Allergen Reactions  . Ace Inhibitors Cough    Current Outpatient Medications on File Prior to Visit  Medication Sig Dispense Refill  . amLODipine (NORVASC) 2.5 MG tablet Take 1 tablet (2.5 mg total) by mouth daily. 90 tablet 1  . aspirin 81 MG tablet Take 81 mg by mouth daily.    . Calcium Carbonate-Vitamin D (CALTRATE 600+D) 600-400 MG-UNIT per tablet Take 2 tablets by mouth daily.    . fluticasone (FLONASE) 50 MCG/ACT nasal spray PLACE 2 SPRAYS INTO THE NOSE DAILY. (Patient taking  differently: as needed) 16 g 5  . hydrochlorothiazide (HYDRODIURIL) 25 MG tablet TAKE ONE TABLET BY MOUTH ONCE DAILY. 90 tablet 1  . KLOR-CON M10 10 MEQ tablet TAKE 1 TABLET BY MOUTH TWICE A DAY 180 tablet 1  . latanoprost (XALATAN) 0.005 % ophthalmic solution Place 1 drop into the left eye at bedtime.     . mirtazapine (REMERON) 7.5 MG tablet Take 1 tablet (7.5 mg total) by mouth at bedtime. 90 tablet 1  . Netarsudil Dimesylate (RHOPRESSA) 0.02 % SOLN Apply 1 drop to eye. 1 drop right eye at bedtime     No current facility-administered medications on file prior to visit.         Objective:   Physical Exam Blood pressure (!) 164/82, pulse (!) 56, temperature 98 F (36.7 C), height 5\' 2"  (1.575 m), weight 143 lb (64.9 kg). Alert and oriented. Skin warm and dry. Oral mucosa is moist.   . Sclera anicteric, conjunctivae is pink. Thyroid not enlarged. No cervical lymphadenopathy. Lungs clear. Heart regular rate and rhythm.  Abdomen is soft. Bowel sounds are positive. No hepatomegaly. No abdominal masses felt. No tenderness.  No edema to lower extremities.           Assessment & Plan:  Hepatitis. Will get CBC, Hepatic, Hep C  quaint, US abdomen.  OV in 1 year. Surveillance colonoscopy.  Colon polyps,Family hx of colon cancer. The risks of bleeding, perforation and infection were reviewed with patient.

## 2017-09-14 LAB — CBC WITH DIFFERENTIAL/PLATELET
BASOS PCT: 0.7 %
Basophils Absolute: 32 cells/uL (ref 0–200)
EOS PCT: 0.4 %
Eosinophils Absolute: 18 cells/uL (ref 15–500)
HEMATOCRIT: 39.8 % (ref 35.0–45.0)
Hemoglobin: 12.9 g/dL (ref 11.7–15.5)
LYMPHS ABS: 2151 {cells}/uL (ref 850–3900)
MCH: 30.5 pg (ref 27.0–33.0)
MCHC: 32.4 g/dL (ref 32.0–36.0)
MCV: 94.1 fL (ref 80.0–100.0)
MPV: 10.6 fL (ref 7.5–12.5)
Monocytes Relative: 6 %
Neutro Abs: 2030 cells/uL (ref 1500–7800)
Neutrophils Relative %: 45.1 %
PLATELETS: 268 10*3/uL (ref 140–400)
RBC: 4.23 10*6/uL (ref 3.80–5.10)
RDW: 12.2 % (ref 11.0–15.0)
Total Lymphocyte: 47.8 %
WBC mixed population: 270 cells/uL (ref 200–950)
WBC: 4.5 10*3/uL (ref 3.8–10.8)

## 2017-09-14 LAB — HEPATIC FUNCTION PANEL
AG Ratio: 1.2 (calc) (ref 1.0–2.5)
ALBUMIN MSPROF: 4.3 g/dL (ref 3.6–5.1)
ALT: 9 U/L (ref 6–29)
AST: 14 U/L (ref 10–35)
Alkaline phosphatase (APISO): 103 U/L (ref 33–130)
BILIRUBIN DIRECT: 0.1 mg/dL (ref 0.0–0.2)
GLOBULIN: 3.6 g/dL (ref 1.9–3.7)
Indirect Bilirubin: 0.5 mg/dL (calc) (ref 0.2–1.2)
Total Bilirubin: 0.6 mg/dL (ref 0.2–1.2)
Total Protein: 7.9 g/dL (ref 6.1–8.1)

## 2017-09-14 LAB — HEPATITIS C RNA QUANTITATIVE
HCV Quantitative Log: <1.18 Log IU/mL
HCV RNA, PCR, QN: <15 IU/mL

## 2017-09-16 ENCOUNTER — Ambulatory Visit (HOSPITAL_COMMUNITY)
Admission: RE | Admit: 2017-09-16 | Discharge: 2017-09-16 | Disposition: A | Discharge status: Home / Self Care | Payer: Medicare Other | Source: Ambulatory Visit | Attending: Internal Medicine | Admitting: Internal Medicine

## 2017-09-16 DIAGNOSIS — Z9049 Acquired absence of other specified parts of digestive tract: Secondary | ICD-10-CM | POA: Insufficient documentation

## 2017-09-16 DIAGNOSIS — B1920 Unspecified viral hepatitis C without hepatic coma: Secondary | ICD-10-CM | POA: Diagnosis not present

## 2017-09-16 DIAGNOSIS — B182 Chronic viral hepatitis C: Secondary | ICD-10-CM | POA: Insufficient documentation

## 2017-10-24 ENCOUNTER — Other Ambulatory Visit: Payer: Self-pay

## 2017-10-24 ENCOUNTER — Encounter (HOSPITAL_COMMUNITY)
Admission: RE | Disposition: A | Discharge status: Home Health | Payer: Self-pay | Source: Ambulatory Visit | Attending: Internal Medicine

## 2017-10-24 ENCOUNTER — Encounter (HOSPITAL_COMMUNITY): Payer: Self-pay | Admitting: *Deleted

## 2017-10-24 ENCOUNTER — Ambulatory Visit (HOSPITAL_COMMUNITY)
Admission: RE | Admit: 2017-10-24 | Discharge: 2017-10-24 | Disposition: A | Discharge status: Home Health | Payer: Medicare Other | Source: Ambulatory Visit | Attending: Internal Medicine | Admitting: Internal Medicine

## 2017-10-24 DIAGNOSIS — Z87891 Personal history of nicotine dependence: Secondary | ICD-10-CM | POA: Insufficient documentation

## 2017-10-24 DIAGNOSIS — F329 Major depressive disorder, single episode, unspecified: Secondary | ICD-10-CM | POA: Insufficient documentation

## 2017-10-24 DIAGNOSIS — Z833 Family history of diabetes mellitus: Secondary | ICD-10-CM | POA: Insufficient documentation

## 2017-10-24 DIAGNOSIS — Z9049 Acquired absence of other specified parts of digestive tract: Secondary | ICD-10-CM | POA: Diagnosis not present

## 2017-10-24 DIAGNOSIS — Z820 Family history of epilepsy and other diseases of the nervous system: Secondary | ICD-10-CM | POA: Insufficient documentation

## 2017-10-24 DIAGNOSIS — Z90710 Acquired absence of both cervix and uterus: Secondary | ICD-10-CM | POA: Diagnosis not present

## 2017-10-24 DIAGNOSIS — Z8249 Family history of ischemic heart disease and other diseases of the circulatory system: Secondary | ICD-10-CM | POA: Diagnosis not present

## 2017-10-24 DIAGNOSIS — H5461 Unqualified visual loss, right eye, normal vision left eye: Secondary | ICD-10-CM | POA: Diagnosis not present

## 2017-10-24 DIAGNOSIS — Z7982 Long term (current) use of aspirin: Secondary | ICD-10-CM | POA: Insufficient documentation

## 2017-10-24 DIAGNOSIS — K6289 Other specified diseases of anus and rectum: Secondary | ICD-10-CM | POA: Diagnosis not present

## 2017-10-24 DIAGNOSIS — I10 Essential (primary) hypertension: Secondary | ICD-10-CM | POA: Insufficient documentation

## 2017-10-24 DIAGNOSIS — K644 Residual hemorrhoidal skin tags: Secondary | ICD-10-CM | POA: Insufficient documentation

## 2017-10-24 DIAGNOSIS — Z79899 Other long term (current) drug therapy: Secondary | ICD-10-CM | POA: Insufficient documentation

## 2017-10-24 DIAGNOSIS — D123 Benign neoplasm of transverse colon: Secondary | ICD-10-CM | POA: Diagnosis not present

## 2017-10-24 DIAGNOSIS — Z888 Allergy status to other drugs, medicaments and biological substances status: Secondary | ICD-10-CM | POA: Insufficient documentation

## 2017-10-24 DIAGNOSIS — Z800 Family history of malignant neoplasm of digestive organs: Secondary | ICD-10-CM | POA: Diagnosis not present

## 2017-10-24 DIAGNOSIS — Z1211 Encounter for screening for malignant neoplasm of colon: Secondary | ICD-10-CM | POA: Insufficient documentation

## 2017-10-24 DIAGNOSIS — D125 Benign neoplasm of sigmoid colon: Secondary | ICD-10-CM | POA: Diagnosis not present

## 2017-10-24 DIAGNOSIS — Z86010 Personal history of colonic polyps: Secondary | ICD-10-CM | POA: Insufficient documentation

## 2017-10-24 DIAGNOSIS — Z09 Encounter for follow-up examination after completed treatment for conditions other than malignant neoplasm: Secondary | ICD-10-CM | POA: Diagnosis not present

## 2017-10-24 DIAGNOSIS — E785 Hyperlipidemia, unspecified: Secondary | ICD-10-CM | POA: Diagnosis not present

## 2017-10-24 HISTORY — PX: POLYPECTOMY: SHX5525

## 2017-10-24 HISTORY — PX: COLONOSCOPY: SHX5424

## 2017-10-24 SURGERY — COLONOSCOPY
Anesthesia: Moderate Sedation

## 2017-10-24 MED ORDER — MEPERIDINE HCL 50 MG/ML IJ SOLN
INTRAMUSCULAR | Status: DC | PRN
  Administered 2017-10-24 (×2): 25 mg via INTRAVENOUS

## 2017-10-24 MED ORDER — MIDAZOLAM HCL 5 MG/5ML IJ SOLN
INTRAMUSCULAR | Status: DC | PRN
  Administered 2017-10-24: 1 mg via INTRAVENOUS
  Administered 2017-10-24 (×2): 2 mg via INTRAVENOUS

## 2017-10-24 MED ORDER — SODIUM CHLORIDE 0.9 % IV SOLN
INTRAVENOUS | Status: DC
  Administered 2017-10-24: 12:00:00 via INTRAVENOUS

## 2017-10-24 MED ORDER — STERILE WATER FOR IRRIGATION IR SOLN
Status: DC | PRN
  Administered 2017-10-24: 13:00:00

## 2017-10-24 MED ORDER — MIDAZOLAM HCL 5 MG/5ML IJ SOLN
INTRAMUSCULAR | Status: AC
  Filled 2017-10-24: qty 10

## 2017-10-24 MED ORDER — MEPERIDINE HCL 50 MG/ML IJ SOLN
INTRAMUSCULAR | Status: AC
  Filled 2017-10-24: qty 1

## 2017-10-24 NOTE — H&P (Signed)
Christina Miles is an 72 y.o. female.   Chief Complaint: Patient is here for colonoscopy. HPI: Patient is 72 year old Afro-American female with history of colonic adenomas and family history of CRC was here for surveillance colonoscopy.  Last exam was in December 2013 with removal of 3 polyps and 2 were tubular adenomas.  She denies abdominal pain change in bowel habits or rectal bleeding. Family history significant for CRC and father who was around 13 at the time of diagnosis and has stage IV disease.  He lived to be 90.  She also had paternal uncle with colon carcinoma at late onset. Last aspirin dose was 5 days ago.  Past Medical History:  Diagnosis Date  . Depression 2014  . Hepatitis   . Hyperlipidemia   . Hypertension   . Reduced vision 2015   right, had hemmorhage, treated by [name], injections    Past Surgical History:  Procedure Laterality Date  . ABDOMINAL HYSTERECTOMY  1985   . ? mass on ovary  . BACK SURGERY    . CHOLECYSTECTOMY    . COLONOSCOPY  02/20/2012   Procedure: COLONOSCOPY;  Surgeon: Rogene Houston, MD;  Location: [facility];  Service: Endoscopy;  Laterality: N/A;  930  . COLONOSCOPY     Patient states that this was 3-4 years ago @[facility]   . GLAUCOMA SURGERY     05/2017  . SPINE SURGERY  1998 approx   [facility]    Family History  Problem Relation Age of Onset  . Colon cancer Father   . Dementia Mother   . Diabetes Brother   . Hypertension Brother   . Hypertension Brother    Social History:  reports that she has quit smoking. Her smoking use included cigarettes. She has a 10.00 pack-year smoking history. She has never used smokeless tobacco. She reports that she does not drink alcohol or use drugs.  Allergies:  Allergies  Allergen Reactions  . Ace Inhibitors Cough    Medications Prior to Admission  Medication Sig Dispense Refill  . amLODipine (NORVASC) 2.5 MG tablet Take 1 tablet (2.5 mg total) by mouth daily. 90 tablet 1  . aspirin 81 MG tablet  Take 81 mg by mouth daily.    . Calcium Carbonate-Vitamin D (CALTRATE 600+D) 600-400 MG-UNIT per tablet Take 2 tablets by mouth daily.    . hydrochlorothiazide (HYDRODIURIL) 25 MG tablet TAKE ONE TABLET BY MOUTH ONCE DAILY. 90 tablet 1  . KLOR-CON M10 10 MEQ tablet TAKE 1 TABLET BY MOUTH TWICE A DAY (Patient taking differently: TAKE 20MEQ TABLET BY MOUTH DAILY) 180 tablet 1  . latanoprost (XALATAN) 0.005 % ophthalmic solution Place 1 drop into the left eye at bedtime.     . mirtazapine (REMERON) 7.5 MG tablet Take 1 tablet (7.5 mg total) by mouth at bedtime. 90 tablet 1  . prednisoLONE acetate (PRED FORTE) 1 % ophthalmic suspension Place 1 drop into the right eye daily.  0  . fluticasone (FLONASE) 50 MCG/ACT nasal spray PLACE 2 SPRAYS INTO THE NOSE DAILY. (Patient taking differently: PLACE 2 SPRAYS INTO THE NOSE DAILY AS NEEDED FOR ALLERGIES) 16 g 5    No results found for this or any previous visit (from the past 87 hour(s)). No results found.  ROS  Blood pressure (!) 153/83, pulse 73, temperature 98.7 F (37.1 C), temperature source Oral, resp. rate (!) 21, height 5\' 2"  (1.575 m), weight 61.7 kg, SpO2 100 %. Physical Exam  Constitutional: She appears well-developed and well-nourished.  HENT:  Mouth/Throat: Oropharynx is clear and moist.  Eyes: Conjunctivae are normal. No scleral icterus.  Neck: No thyromegaly present.  Cardiovascular: Normal rate, regular rhythm and normal heart sounds.  No murmur heard. Respiratory: Effort normal and breath sounds normal.  GI: Soft. She exhibits no distension and no mass.  Lower midline scar.  Lymphadenopathy:    She has no cervical adenopathy.  Neurological: She is alert.  Skin: Skin is warm and dry.     Assessment/Plan History of colonic carcinoma. Family history of CRC and first and second-degree relative. Surveillance colonoscopy.  Hildred Laser, MD 10/24/2017, 12:30 PM

## 2017-10-24 NOTE — Discharge Instructions (Signed)
Colonoscopy, Adult, Care After This sheet gives you information about how to care for yourself after your procedure. Your health care provider may also give you more specific instructions. If you have problems or questions, contact your health care provider. What can I expect after the procedure? After the procedure, it is common to have:  A small amount of blood in your stool for 24 hours after the procedure.  Some gas.  Mild abdominal cramping or bloating.  Follow these instructions at home: General instructions   For the first 24 hours after the procedure: ? Do not drive or use machinery. ? Do not sign important documents. ? Do not drink alcohol. ? Do your regular daily activities at a slower pace than normal. ? Eat soft, easy-to-digest foods. ? Rest often.  Take over-the-counter or prescription medicines only as told by your health care provider.  It is up to you to get the results of your procedure. Ask your health care provider, or the department performing the procedure, when your results will be ready. Relieving cramping and bloating  Try walking around when you have cramps or feel bloated.  Apply heat to your abdomen as told by your health care provider. Use a heat source that your health care provider recommends, such as a moist heat pack or a heating pad. ? Place a towel between your skin and the heat source. ? Leave the heat on for 20-30 minutes. ? Remove the heat if your skin turns bright red. This is especially important if you are unable to feel pain, heat, or cold. You may have a greater risk of getting burned. Eating and drinking  Drink enough fluid to keep your urine clear or pale yellow.  Resume your normal diet as instructed by your health care provider. Avoid heavy or fried foods that are hard to digest.  Avoid drinking alcohol for as long as instructed by your health care provider. Contact a health care provider if:  You have blood in your stool 2-3  days after the procedure. Get help right away if:  You have more than a small spotting of blood in your stool.  You pass large blood clots in your stool.  Your abdomen is swollen.  You have nausea or vomiting.  You have a fever.  You have increasing abdominal pain that is not relieved with medicine. This information is not intended to replace advice given to you by your health care provider. Make sure you discuss any questions you have with your health care provider. Document Released: 10/18/2003 Document Revised: 11/28/2015 Document Reviewed: 05/17/2015 Elsevier Interactive Patient Education  2018 [employer]. Colon Polyps Polyps are tissue growths inside the body. Polyps can grow in many places, including the large intestine (colon). A polyp may be a round bump or a mushroom-shaped growth. You could have one polyp or several. Most colon polyps are noncancerous (benign). However, some colon polyps can become cancerous over time. What are the causes? The exact cause of colon polyps is not known. What increases the risk? This condition is more likely to develop in people who:  Have a family history of colon cancer or colon polyps.  Are older than 65 or older than 45 if they are African American.  Have inflammatory bowel disease, such as ulcerative colitis or Crohn disease.  Are overweight.  Smoke cigarettes.  Do not get enough exercise.  Drink too much alcohol.  Eat a diet that is: ? High in fat and red meat. ? Low in  fiber.  Had childhood cancer that was treated with abdominal radiation.  What are the signs or symptoms? Most polyps do not cause symptoms. If you have symptoms, they may include:  Blood coming from your rectum when having a bowel movement.  Blood in your stool.The stool may look dark red or black.  A change in bowel habits, such as constipation or diarrhea.  How is this diagnosed? This condition is diagnosed with a colonoscopy. This is a  procedure that uses a lighted, flexible scope to look at the inside of your colon. How is this treated? Treatment for this condition involves removing any polyps that are found. Those polyps will then be tested for cancer. If cancer is found, your health care provider will talk to you about options for colon cancer treatment. Follow these instructions at home: Diet  Eat plenty of fiber, such as fruits, vegetables, and whole grains.  Eat foods that are high in calcium and vitamin D, such as milk, cheese, yogurt, eggs, liver, fish, and broccoli.  Limit foods high in fat, red meats, and processed meats, such as hot dogs, sausage, bacon, and lunch meats.  Maintain a healthy weight, or lose weight if recommended by your health care provider. General instructions  Do not smoke cigarettes.  Do not drink alcohol excessively.  Keep all follow-up visits as told by your health care provider. This is important. This includes keeping regularly scheduled colonoscopies. Talk to your health care provider about when you need a colonoscopy.  Exercise every day or as told by your health care provider. Contact a health care provider if:  You have new or worsening bleeding during a bowel movement.  You have new or increased blood in your stool.  You have a change in bowel habits.  You unexpectedly lose weight. This information is not intended to replace advice given to you by your health care provider. Make sure you discuss any questions you have with your health care provider. Document Released: 11/30/2003 Document Revised: 08/11/2015 Document Reviewed: 01/24/2015 Elsevier Interactive Patient Education  2018 [employer]. No aspirin or NSAIDs for 24 hours. Resume other medications and diet as before. No driving for 24 hours. Physician will call with biopsy results.

## 2017-10-24 NOTE — Op Note (Signed)
[facility] Patient Name: Christina Miles Procedure Date: 10/24/2017 12:03 PM MRN: [id number] Date of Birth: 07-22-1945 Attending MD: Hildred Laser , MD CSN: [id number] Age: 72 Admit Type: Outpatient Procedure:                Colonoscopy Indications:              High risk colon cancer surveillance: Personal                            history of colonic polyps, Family history of colon                            cancer in a first-degree relative Providers:                Hildred Laser, MD, Otis Peak B. Sharon Seller, RN, Nelma Rothman, Technician Referring MD:             Norwood Levo. [place], MD Medicines:                Meperidine 50 mg IV, Midazolam 5 mg IV Complications:            No immediate complications. Estimated Blood Loss:     Estimated blood loss was minimal. Procedure:                Pre-Anesthesia Assessment:                           - Prior to the procedure, a History and Physical                            was performed, and patient medications and                            allergies were reviewed. The patient's tolerance of                            previous anesthesia was also reviewed. The risks                            and benefits of the procedure and the sedation                            options and risks were discussed with the patient.                            All questions were answered, and informed consent                            was obtained. Prior Anticoagulants: The patient                            last took aspirin 5 days prior to the procedure.  ASA Grade Assessment: II - A patient with mild                            systemic disease. After reviewing the risks and                            benefits, the patient was deemed in satisfactory                            condition to undergo the procedure.                           After obtaining informed consent, the colonoscope                             was passed under direct vision. Throughout the                            procedure, the patient's blood pressure, pulse, and                            oxygen saturations were monitored continuously. The                            [id number]-[id number] ([id number]) scope was introduced through                            the anus and advanced to the the cecum, identified                            by appendiceal orifice and ileocecal valve. The                            colonoscopy was performed without difficulty. The                            patient tolerated the procedure well. The quality                            of the bowel preparation was excellent. The                            ileocecal valve, appendiceal orifice, and rectum                            were photographed. Scope In: 12:39:35 PM Scope Out: 1:59:05 PM Scope Withdrawal Time: 1 hour 13 minutes 34 seconds  Total Procedure Duration: 1 hour 19 minutes 30 seconds  Findings:      The perianal and digital rectal examinations were normal.      A small polyp was found in the splenic flexure. The polyp was sessile.       Biopsies were taken with a cold forceps for histology. The pathology       specimen was placed into Bottle Number 1.  A small polyp was found in the distal sigmoid colon. The polyp was       removed with a cold snare. Resection and retrieval were complete. The       pathology specimen was placed into Bottle Number 1.      External hemorrhoids were found during retroflexion. The hemorrhoids       were small.      Anal papilla(e) were hypertrophied. Impression:               - One small polyp at the splenic flexure. Biopsied.                           - One small polyp in the distal sigmoid colon,                            removed with a cold snare. Resected and retrieved.                           - External hemorrhoids.                           - Anal papilla(e) were hypertrophied. Moderate Sedation:       Moderate (conscious) sedation was administered by the endoscopy nurse       and supervised by the endoscopist. The following parameters were       monitored: oxygen saturation, heart rate, blood pressure, CO2       capnography and response to care. Total physician intraservice time was       25 minutes. Recommendation:           - Patient has a contact number available for                            emergencies. The signs and symptoms of potential                            delayed complications were discussed with the                            patient. Return to normal activities tomorrow.                            Written discharge instructions were provided to the                            patient.                           - Resume previous diet today.                           - Continue present medications.                           - No aspirin, ibuprofen, naproxen, or other  non-steroidal anti-inflammatory drugs for 1 day.                           - Await pathology results.                           - Repeat colonoscopy in 5 years for surveillance. Procedure Code(s):        --- Professional ---                           [phone], Colonoscopy, flexible; with removal of                            tumor(s), polyp(s), or other lesion(s) by snare                            technique                           45380, 59, Colonoscopy, flexible; with biopsy,                            single or multiple                           G0500, Moderate sedation services provided by the                            same physician or other qualified health care                            professional performing a gastrointestinal                            endoscopic service that sedation supports,                            requiring the presence of an independent trained                            observer to assist in the monitoring of the                             patient's level of consciousness and physiological                            status; initial 15 minutes of intra-service time;                            patient age 28 years or older (additional time may                            be reported with [phone], as appropriate)  99153, Moderate sedation services provided by the                            same physician or other qualified health care                            professional performing the diagnostic or                            therapeutic service that the sedation supports,                            requiring the presence of an independent trained                            observer to assist in the monitoring of the                            patient's level of consciousness and physiological                            status; each additional 15 minutes intraservice                            time (List separately in addition to code for                            primary service) Diagnosis Code(s):        --- Professional ---                           Z86.010, Personal history of colonic polyps                           D12.3, Benign neoplasm of transverse colon (hepatic                            flexure or splenic flexure)                           D12.5, Benign neoplasm of sigmoid colon                           K62.89, Other specified diseases of anus and rectum                           K64.4, Residual hemorrhoidal skin tags                           Z80.0, Family history of malignant neoplasm of                            digestive organs CPT copyright 2017 American [employer] Association. All rights reserved. The codes documented in this report are preliminary and upon coder review may  be revised to meet current  compliance requirements. Hildred Laser, MD Hildred Laser, MD 10/24/2017 1:07:24 PM This report has been signed electronically. Number of Addenda: 0

## 2017-10-29 ENCOUNTER — Encounter (HOSPITAL_COMMUNITY): Payer: Self-pay | Admitting: Internal Medicine

## 2017-12-04 DIAGNOSIS — H348312 Tributary (branch) retinal vein occlusion, right eye, stable: Secondary | ICD-10-CM | POA: Diagnosis not present

## 2017-12-04 DIAGNOSIS — H2513 Age-related nuclear cataract, bilateral: Secondary | ICD-10-CM | POA: Diagnosis not present

## 2017-12-04 DIAGNOSIS — H401112 Primary open-angle glaucoma, right eye, moderate stage: Secondary | ICD-10-CM | POA: Diagnosis not present

## 2018-01-07 ENCOUNTER — Ambulatory Visit (INDEPENDENT_AMBULATORY_CARE_PROVIDER_SITE_OTHER): Payer: Medicare Other | Admitting: Family Medicine

## 2018-01-07 ENCOUNTER — Encounter: Payer: Self-pay | Admitting: Family Medicine

## 2018-01-07 VITALS — BP 140/80 | HR 98 | Resp 12 | Ht 62.5 in | Wt 143.1 lb

## 2018-01-07 DIAGNOSIS — E559 Vitamin D deficiency, unspecified: Secondary | ICD-10-CM

## 2018-01-07 DIAGNOSIS — Z0000 Encounter for general adult medical examination without abnormal findings: Secondary | ICD-10-CM

## 2018-01-07 DIAGNOSIS — R21 Rash and other nonspecific skin eruption: Secondary | ICD-10-CM

## 2018-01-07 DIAGNOSIS — Z1231 Encounter for screening mammogram for malignant neoplasm of breast: Secondary | ICD-10-CM

## 2018-01-07 DIAGNOSIS — Z23 Encounter for immunization: Secondary | ICD-10-CM | POA: Diagnosis not present

## 2018-01-07 DIAGNOSIS — E7849 Other hyperlipidemia: Secondary | ICD-10-CM

## 2018-01-07 DIAGNOSIS — I10 Essential (primary) hypertension: Secondary | ICD-10-CM

## 2018-01-07 MED ORDER — HYDROXYZINE HCL 10 MG PO TABS: ORAL_TABLET | ORAL | 0 refills | Status: DC

## 2018-01-07 MED ORDER — PREDNISONE 5 MG PO TABS: ORAL_TABLET | ORAL | 0 refills | Status: DC

## 2018-01-07 NOTE — Assessment & Plan Note (Signed)
After obtaining informed consent, the vaccine is  administered by LPN.  

## 2018-01-07 NOTE — Assessment & Plan Note (Signed)

## 2018-01-07 NOTE — Progress Notes (Signed)
    Christina Miles     MRN: [id number]      DOB: [birth year]  HPI: Patient is in for annual physical exam.  1 week ago developed rash in LLE , no purulent drainage, very pruritic , now on left arm not as itchy but present  Immunization is reviewed , and fu vaccine administered  PE: BP 140/80   Pulse 98   Resp 12   Ht 5' 2.5" (1.588 m)   Wt 143 lb 1.9 oz (64.9 kg)   SpO2 100% Comment: room air  BMI 25.76 kg/m   Pleasant  female, alert and oriented x 3, in no cardio-pulmonary distress. Afebrile. HEENT No facial trauma or asymetry. Sinuses non tender.  Extra occullar muscles intact, pupils equally reactive to light. External ears normal, tympanic membranes clear. Oropharynx moist, no exudate. Neck: supple, no adenopathy,JVD or thyromegaly.No bruits.  Chest: Clear to ascultation bilaterally.No crackles or wheezes. Non tender to palpation  Breast: No asymetry,no masses or lumps. No tenderness. No nipple discharge or inversion. No axillary or supraclavicular adenopathy  Cardiovascular system; Heart sounds normal,  S1 and  S2 ,no S3.  No murmur, or thrill. Apical beat not displaced Peripheral pulses normal.  Abdomen: Soft, non tender, no organomegaly or masses. No bruits. Bowel sounds normal. No guarding, tenderness or rebound.     Musculoskeletal exam: Full ROM of spine, hips , shoulders and knees. No deformity ,swelling or crepitus noted. No muscle wasting or atrophy.   Neurologic: Cranial nerves 2 to 12 intact. Power, tone ,sensation and reflexes normal throughout. No disturbance in gait. No tremor.  Skin: Intact, hyper pigmented macular  rash noted.on right arm Pigmentation normal throughout  Psych; Normal mood and affect. Judgement and concentration normal   Assessment & Plan:  Annual physical exam Annual exam as documented. Counseling done  re healthy lifestyle involving commitment to 150 minutes exercise per week, heart healthy diet, and  attaining healthy weight.The importance of adequate sleep also discussed. Regular seat belt use and home safety, is also discussed. Changes in health habits are decided on by the patient with goals and time frames  set for achieving them. Immunization and cancer screening needs are specifically addressed at this visit.   Need for immunization against influenza After obtaining informed consent, the vaccine is  administered by LPN.   Rash and nonspecific skin eruption 5 day course of oral prednisone prescribe, pt to see dermatology if persists or recurrs

## 2018-01-07 NOTE — Patient Instructions (Signed)
F/iu in mid January, call if you need me before  Flu vaccine today  Fasting lipid, cmp and eGFr , vit D 1 week  Before Jan visit  Please schedule Nov mammogram at checkout   5 day course odf prednisone and med for itch is prescribed. If recurrs then call for referral  BPP and weight are both slightly hiogh, if you work on both this will improve, and you need to It is important that you exercise regularly at least 30 minutes 5 times a week. If you develop chest pain, have severe difficulty breathing, or feel very tired, stop exercising immediately and seek medical attention  Thank you  for choosing  [facility]. We consider it a privelige to serve you.  Delivering excellent health care in a caring and  compassionate way is our goal.  Partnering with you,  so that together we can achieve this goal is our strategy.

## 2018-01-24 ENCOUNTER — Ambulatory Visit (HOSPITAL_COMMUNITY)
Admission: RE | Admit: 2018-01-24 | Discharge: 2018-01-24 | Disposition: A | Discharge status: Home / Self Care | Payer: Medicare Other | Source: Ambulatory Visit | Attending: Family Medicine | Admitting: Family Medicine

## 2018-01-24 DIAGNOSIS — Z1231 Encounter for screening mammogram for malignant neoplasm of breast: Secondary | ICD-10-CM

## 2018-02-15 ENCOUNTER — Encounter: Payer: Self-pay | Admitting: Family Medicine

## 2018-02-15 DIAGNOSIS — R21 Rash and other nonspecific skin eruption: Secondary | ICD-10-CM | POA: Insufficient documentation

## 2018-02-15 NOTE — Assessment & Plan Note (Signed)
5 day course of oral prednisone prescribe, pt to see dermatology if persists or recurrs

## 2018-02-22 ENCOUNTER — Other Ambulatory Visit: Payer: Self-pay | Admitting: Family Medicine

## 2018-03-05 DIAGNOSIS — H2513 Age-related nuclear cataract, bilateral: Secondary | ICD-10-CM | POA: Diagnosis not present

## 2018-03-05 DIAGNOSIS — H401112 Primary open-angle glaucoma, right eye, moderate stage: Secondary | ICD-10-CM | POA: Diagnosis not present

## 2018-03-05 DIAGNOSIS — H348312 Tributary (branch) retinal vein occlusion, right eye, stable: Secondary | ICD-10-CM | POA: Diagnosis not present

## 2018-04-07 DIAGNOSIS — I10 Essential (primary) hypertension: Secondary | ICD-10-CM | POA: Diagnosis not present

## 2018-04-07 DIAGNOSIS — E559 Vitamin D deficiency, unspecified: Secondary | ICD-10-CM | POA: Diagnosis not present

## 2018-04-07 DIAGNOSIS — E7849 Other hyperlipidemia: Secondary | ICD-10-CM | POA: Diagnosis not present

## 2018-04-08 ENCOUNTER — Ambulatory Visit (INDEPENDENT_AMBULATORY_CARE_PROVIDER_SITE_OTHER): Payer: Medicare Other | Admitting: Family Medicine

## 2018-04-08 ENCOUNTER — Ambulatory Visit (HOSPITAL_COMMUNITY)
Admission: RE | Admit: 2018-04-08 | Discharge: 2018-04-08 | Disposition: A | Discharge status: Home / Self Care | Payer: Medicare Other | Source: Ambulatory Visit | Attending: Family Medicine | Admitting: Family Medicine

## 2018-04-08 ENCOUNTER — Encounter: Payer: Self-pay | Admitting: Family Medicine

## 2018-04-08 VITALS — BP 120/80 | HR 83 | Temp 98.8°F | Resp 15 | Ht 62.5 in | Wt 133.0 lb

## 2018-04-08 DIAGNOSIS — J208 Acute bronchitis due to other specified organisms: Secondary | ICD-10-CM | POA: Insufficient documentation

## 2018-04-08 DIAGNOSIS — E7849 Other hyperlipidemia: Secondary | ICD-10-CM

## 2018-04-08 DIAGNOSIS — I10 Essential (primary) hypertension: Secondary | ICD-10-CM

## 2018-04-08 DIAGNOSIS — J209 Acute bronchitis, unspecified: Secondary | ICD-10-CM

## 2018-04-08 DIAGNOSIS — F5101 Primary insomnia: Secondary | ICD-10-CM

## 2018-04-08 DIAGNOSIS — R05 Cough: Secondary | ICD-10-CM | POA: Diagnosis not present

## 2018-04-08 LAB — COMPLETE METABOLIC PANEL WITH GFR
AG RATIO: 1.2 (calc) (ref 1.0–2.5)
ALBUMIN MSPROF: 4.3 g/dL (ref 3.6–5.1)
ALKALINE PHOSPHATASE (APISO): 67 U/L (ref 33–130)
ALT: 12 U/L (ref 6–29)
AST: 19 U/L (ref 10–35)
BILIRUBIN TOTAL: 0.6 mg/dL (ref 0.2–1.2)
BUN / CREAT RATIO: 12 (calc) (ref 6–22)
BUN: 12 mg/dL (ref 7–25)
CHLORIDE: 99 mmol/L (ref 98–110)
CO2: 29 mmol/L (ref 20–32)
Calcium: 9.5 mg/dL (ref 8.6–10.4)
Creat: 1.01 mg/dL — ABNORMAL HIGH (ref 0.60–0.93)
GFR, Est African American: 65 mL/min/{1.73_m2} (ref >=60)
GFR, Est Non African American: 56 mL/min/{1.73_m2} — ABNORMAL LOW (ref >=60)
Globulin: 3.6 g/dL (calc) (ref 1.9–3.7)
Glucose, Bld: 92 mg/dL (ref 65–99)
POTASSIUM: 4 mmol/L (ref 3.5–5.3)
Sodium: 138 mmol/L (ref 135–146)
Total Protein: 7.9 g/dL (ref 6.1–8.1)

## 2018-04-08 LAB — LIPID PANEL
Cholesterol: 183 mg/dL (ref <200)
HDL: 51 mg/dL (ref >50)
LDL CHOLESTEROL (CALC): 107 mg/dL — AB
NON-HDL CHOLESTEROL (CALC): 132 mg/dL — AB (ref <130)
TRIGLYCERIDES: 141 mg/dL (ref <150)
Total CHOL/HDL Ratio: 3.6 (calc) (ref <5.0)

## 2018-04-08 LAB — VITAMIN D 25 HYDROXY (VIT D DEFICIENCY, FRACTURES): VIT D 25 HYDROXY: 21 ng/mL — AB (ref 30–100)

## 2018-04-08 MED ORDER — BENZONATATE 100 MG PO CAPS: 100.0000 mg | ORAL_CAPSULE | Freq: Two times a day (BID) | ORAL | 0 refills | Status: DC | PRN

## 2018-04-08 MED ORDER — PENICILLIN V POTASSIUM 500 MG PO TABS: 500.0000 mg | ORAL_TABLET | Freq: Three times a day (TID) | ORAL | 0 refills | Status: DC

## 2018-04-08 NOTE — Progress Notes (Signed)
   Christina Miles     MRN: [id number]      DOB: 18-Sep-1945   HPI [name] is here for follow up and re-evaluation of chronic medical conditions, medication management and review of any available recent lab and radiology data.  Preventive health is updated, specifically  Cancer screening and Immunization.   Questions or concerns regarding consultations or procedures which the PT has had in the interim are  addressed. The PT denies any adverse reactions to current medications since the last visit.  2 week h/o cough and chest congestion , fever x 4 days, up to 100.4 Fatigue and poor appetitie ROS C/op  sinus pressure, nasal congestion,denies  ear pain or sore throat.  Denies chest pains, palpitations and leg swelling Denies abdominal pain, nausea, vomiting,diarrhea or constipation.   Denies dysuria, frequency, hesitancy or incontinence. Denies joint pain, swelling and limitation in mobility. Denies headaches, seizures, numbness, or tingling. Denies depression, anxiety or insomnia. Denies skin break down or rash.   PE BP 120/80   Pulse 83   Temp 98.8 F (37.1 C) (Oral)   Resp 15   Ht 5' 2.5" (1.588 m)   Wt 133 lb (60.3 kg)   SpO2 98%   BMI 23.94 kg/m    Patient alert and oriented and in no cardiopulmonary distress.  HEENT: No facial asymmetry, EOMI,   oropharynx pink and moist.  Neck supple no JVD, no mass.  Chest: decreased air entry  Scattered crackles, no wheezes CVS: S1, S2 no murmurs, no S3.Regular rate.  ABD: Soft non tender.   Ext: No edema  MS: Adequate ROM spine, shoulders, hips and knees.  Skin: Intact, no ulcerations or rash noted.  Psych: Good eye contact, normal affect. Memory intact not anxious or depressed appearing.  CNS: CN 2-12 intact, power,  normal throughout.no focal deficits noted.   Assessment & Plan Acute bronchitis due to infection Pen v x 10 days  Essential hypertension Controlled, no change in medication A a healthy weight is  also discussed.  DASH diet and commitment to daily physical activity for a minimum of 30 minutes discussed and encouraged, as a part of hypertension management. The importance of attaining a healthy weight is also discussed.  BP/Weight 04/08/2018 01/07/2018 10/24/2017 09/12/2017 06/17/2017 01/03/2017 7/74/1287  Systolic BP 867 672 094 709 628 366 294  Diastolic BP 80 80 61 82 80 68 74  Wt. (Lbs) 133 143.12 136 143 142 134.5 133.8  BMI 23.94 25.76 24.87 26.16 25.97 24.6 24.47          Hyperlipidemia Hyperlipidemia:Low fat diet discussed and encouraged.   Lipid Panel  Lab Results  Component Value Date   CHOL 183 04/07/2018   HDL 51 04/07/2018   LDLCALC 107 (H) 04/07/2018   TRIG 141 04/07/2018   CHOLHDL 3.6 04/07/2018   Needs to reduce fat in diet    Insomnia Controlled, no change in medication  ]

## 2018-04-08 NOTE — Assessment & Plan Note (Signed)
Pen v x 10 days

## 2018-04-08 NOTE — Patient Instructions (Addendum)
Wellness with MD in May  Annual physical exam with MD in October.  Please reduce fat in diet, otherwise labs and  BP, weight  are good  You are treated for acute bronchitis, please get CXR today Medication sent to [facility] please collect today      Acute Bronchitis, Adult Acute bronchitis is when air tubes (bronchi) in the lungs suddenly get swollen. The condition can make it hard to breathe. It can also cause these symptoms:  A cough.  Coughing up clear, yellow, or green mucus.  Wheezing.  Chest congestion.  Shortness of breath.  A fever.  Body aches.  Chills.  A sore throat. Follow these instructions at home:  Medicines  Take over-the-counter and prescription medicines only as told by your doctor.  If you were prescribed an antibiotic medicine, take it as told by your doctor. Do not stop taking the antibiotic even if you start to feel better. General instructions  Rest.  Drink enough fluids to keep your pee (urine) pale yellow.  Avoid smoking and secondhand smoke. If you smoke and you need help quitting, ask your doctor. Quitting will help your lungs heal faster.  Use an inhaler, cool mist vaporizer, or humidifier as told by your doctor.  Keep all follow-up visits as told by your doctor. This is important. How is this prevented? To lower your risk of getting this condition again:  Wash your hands often with soap and water. If you cannot use soap and water, use hand sanitizer.  Avoid contact with people who have cold symptoms.  Try not to touch your hands to your mouth, nose, or eyes.  Make sure to get the flu shot every year. Contact a doctor if:  Your symptoms do not get better in 2 weeks. Get help right away if:  You cough up blood.  You have chest pain.  You have very bad shortness of breath.  You become dehydrated.  You faint (pass out) or keep feeling like you are going to pass out.  You keep throwing up (vomiting).  You have a very bad  headache.  Your fever or chills gets worse. This information is not intended to replace advice given to you by your health care provider. Make sure you discuss any questions you have with your health care provider. Document Released: 08/22/2007 Document Revised: 10/17/2016 Document Reviewed: 08/24/2015 Elsevier Interactive Patient Education  2019 [employer].

## 2018-04-08 NOTE — Assessment & Plan Note (Signed)
Controlled, no change in medication  

## 2018-04-08 NOTE — Assessment & Plan Note (Signed)
Hyperlipidemia:Low fat diet discussed and encouraged.   Lipid Panel  Lab Results  Component Value Date   CHOL 183 04/07/2018   HDL 51 04/07/2018   LDLCALC 107 (H) 04/07/2018   TRIG 141 04/07/2018   CHOLHDL 3.6 04/07/2018   Needs to reduce fat in diet

## 2018-04-08 NOTE — Assessment & Plan Note (Addendum)
Controlled, no change in medication A a healthy weight is also discussed.  DASH diet and commitment to daily physical activity for a minimum of 30 minutes discussed and encouraged, as a part of hypertension management. The importance of attaining a healthy weight is also discussed.  BP/Weight 04/08/2018 01/07/2018 10/24/2017 09/12/2017 06/17/2017 01/03/2017 3/76/2831  Systolic BP 517 616 073 710 626 948 546  Diastolic BP 80 80 61 82 80 68 74  Wt. (Lbs) 133 143.12 136 143 142 134.5 133.8  BMI 23.94 25.76 24.87 26.16 25.97 24.6 24.47

## 2018-05-05 ENCOUNTER — Other Ambulatory Visit: Payer: Self-pay | Admitting: Family Medicine

## 2018-05-05 DIAGNOSIS — F329 Major depressive disorder, single episode, unspecified: Secondary | ICD-10-CM

## 2018-07-07 DIAGNOSIS — H348312 Tributary (branch) retinal vein occlusion, right eye, stable: Secondary | ICD-10-CM | POA: Diagnosis not present

## 2018-07-07 DIAGNOSIS — H2513 Age-related nuclear cataract, bilateral: Secondary | ICD-10-CM | POA: Diagnosis not present

## 2018-07-07 DIAGNOSIS — H401112 Primary open-angle glaucoma, right eye, moderate stage: Secondary | ICD-10-CM | POA: Diagnosis not present

## 2018-07-07 DIAGNOSIS — H401121 Primary open-angle glaucoma, left eye, mild stage: Secondary | ICD-10-CM | POA: Diagnosis not present

## 2018-07-17 ENCOUNTER — Ambulatory Visit (INDEPENDENT_AMBULATORY_CARE_PROVIDER_SITE_OTHER): Payer: Medicare Other | Admitting: Family Medicine

## 2018-07-17 ENCOUNTER — Other Ambulatory Visit: Payer: Self-pay

## 2018-07-17 ENCOUNTER — Encounter: Payer: Self-pay | Admitting: Family Medicine

## 2018-07-17 VITALS — BP 120/80 | HR 83 | Temp 97.8°F | Resp 17 | Ht 62.5 in | Wt 133.0 lb

## 2018-07-17 DIAGNOSIS — Z0000 Encounter for general adult medical examination without abnormal findings: Secondary | ICD-10-CM | POA: Diagnosis not present

## 2018-07-17 NOTE — Progress Notes (Addendum)
Subjective:   Christina Miles is a 73 y.o. female who presents for Medicare Annual (Subsequent) preventive examination.  Location of Patient: Home Location of Provider: Telehealth Consent was obtain for visit to be over via telehealth. I verified that I am speaking with the correct person using two identifiers.  Review of Systems:   Cardiac Risk Factors include: advanced age (>14men, >27 women);obesity (BMI >30kg/m2)     Objective:     Vitals: BP 120/80   Pulse 83   Temp 97.8 F (36.6 C)   Ht 5' 2.5" (1.588 m)   Wt 133 lb (60.3 kg)   BMI 23.94 kg/m   Body mass index is 23.94 kg/m.  Advanced Directives 10/24/2017 02/20/2012  Does Patient Have a Medical Advance Directive? No Patient does not have advance directive;Patient would like information  Would patient like information on creating a medical advance directive? Yes (MAU/Ambulatory/Procedural Areas - Information given) Advance directive packet given  Pre-existing out of facility DNR order (yellow form or pink MOST form) - No    Tobacco Social History   Tobacco Use  Smoking Status Former Smoker  . Packs/day: 1.00  . Years: 10.00  . Pack years: 10.00  . Types: Cigarettes  Smokeless Tobacco Never Used     Counseling given: Not Answered   Clinical Intake:  Pre-visit preparation completed: Yes  Pain : No/denies pain     Nutritional Risks: None Diabetes: No  How often do you need to have someone help you when you read instructions, pamphlets, or other written materials from your doctor or pharmacy?: 1 - Never What is the last grade level you completed in school?: 12  Interpreter Needed?: No     Past Medical History:  Diagnosis Date  . Depression 2014  . Hepatitis   . Hyperlipidemia   . Hypertension   . Reduced vision 2015   right, had hemmorhage, treated by [name], injections   Past Surgical History:  Procedure Laterality Date  . ABDOMINAL HYSTERECTOMY  1985   . ? mass on ovary  . BACK  SURGERY    . CHOLECYSTECTOMY    . COLONOSCOPY  02/20/2012   Procedure: COLONOSCOPY;  Surgeon: Rogene Houston, MD;  [facility];  Service: Endoscopy;  Laterality: N/A;  930  . COLONOSCOPY     Patient states that this was 3-4 years ago @[facility]   . COLONOSCOPY N/A 10/24/2017   Procedure: COLONOSCOPY;  Surgeon: Rogene Houston, MD;  [facility];  Service: Endoscopy;  Laterality: N/A;  1300  . GLAUCOMA SURGERY     05/2017  . POLYPECTOMY  10/24/2017   Procedure: POLYPECTOMY;  Surgeon: Rogene Houston, MD;  [facility];  Service: Endoscopy;;  colon  . SPINE SURGERY  1998 approx   [place]   Family History  Problem Relation Age of Onset  . Colon cancer Father   . Dementia Mother   . Diabetes Brother   . Hypertension Brother   . Hypertension Brother    Social History   Socioeconomic History  . Marital status: Married    Spouse name: Not on file  . Number of children: Not on file  . Years of education: Not on file  . Highest education level: Not on file  Occupational History  . Not on file  Social Needs  . Financial resource strain: Not on file  . Food insecurity:    Worry: Not on file    Inability: Not on file  . Transportation  needs:    Medical: Not on file    Non-medical: Not on file  Tobacco Use  . Smoking status: Former Smoker    Packs/day: 1.00    Years: 10.00    Pack years: 10.00    Types: Cigarettes  . Smokeless tobacco: Never Used  Substance and Sexual Activity  . Alcohol use: No    Alcohol/week: 0.0 standard drinks  . Drug use: No  . Sexual activity: Yes  Lifestyle  . Physical activity:    Days per week: Not on file    Minutes per session: Not on file  . Stress: Not on file  Relationships  . Social connections:    Talks on phone: Not on file    Gets together: Not on file    Attends religious service: Not on file    Active member of club or organization: Not on file    Attends meetings of clubs or organizations: Not on file     Relationship status: Not on file  Other Topics Concern  . Not on file  Social History Narrative  . Not on file    Outpatient Encounter Medications as of 07/17/2018  Medication Sig  . amLODipine (NORVASC) 2.5 MG tablet Take 1 tablet (2.5 mg total) by mouth daily.  Marland Kitchen aspirin 81 MG tablet Take 1 tablet (81 mg total) by mouth daily.  . benzonatate (TESSALON) 100 MG capsule Take 1 capsule (100 mg total) by mouth 2 (two) times daily as needed.  . Calcium Carbonate-Vitamin D (CALTRATE 600+D) 600-400 MG-UNIT per tablet Take 2 tablets by mouth daily.  . fluticasone (FLONASE) 50 MCG/ACT nasal spray PLACE 2 SPRAYS INTO THE NOSE DAILY. (Patient taking differently: PLACE 2 SPRAYS INTO THE NOSE DAILY AS NEEDED FOR ALLERGIES)  . hydrochlorothiazide (HYDRODIURIL) 25 MG tablet TAKE ONE TABLET BY MOUTH ONCE DAILY.  . hydrOXYzine (ATARAX/VISTARIL) 10 MG tablet One tablet two times daily as needed for utching  . KLOR-CON M10 10 MEQ tablet TAKE 1 TABLET BY MOUTH TWICE A DAY (Patient taking differently: TAKE 20MEQ TABLET BY MOUTH DAILY)  . latanoprost (XALATAN) 0.005 % ophthalmic solution Place 1 drop into the left eye at bedtime.   . mirtazapine (REMERON) 7.5 MG tablet TAKE ONE TABLET BY MOUTH AT BEDTIME.  Marland Kitchen penicillin v potassium (VEETID) 500 MG tablet Take 1 tablet (500 mg total) by mouth 3 (three) times daily.  . potassium chloride (K-DUR) 10 MEQ tablet TAKE ONE TABLET BY MOUTH TWICE DAILY.  Marland Kitchen prednisoLONE acetate (PRED FORTE) 1 % ophthalmic suspension Place 1 drop into the right eye every other day.    No facility-administered encounter medications on file as of 07/17/2018.     Activities of Daily Living In your present state of health, do you have any difficulty performing the following activities: 07/17/2018  Hearing? N  Vision? Y  Difficulty concentrating or making decisions? N  Walking or climbing stairs? N  Dressing or bathing? N  Doing errands, shopping? N  Preparing Food and eating ? N  Using  the Toilet? N  In the past six months, have you accidently leaked urine? N  Do you have problems with loss of bowel control? N  Managing your Medications? N  Managing your Finances? N  Housekeeping or managing your Housekeeping? N  Some recent data might be hidden    Patient Care Team: Fayrene Helper, MD as PCP - General Hayden Pedro, MD as Consulting Physician (Ophthalmology)    Assessment:   This is a routine  wellness examination for [place].  Exercise Activities and Dietary recommendations Current Exercise Habits: The patient does not participate in regular exercise at present, Exercise limited by: None identified  Goals   None     Fall Risk Fall Risk  07/17/2018 04/08/2018 01/07/2018 06/17/2017 06/04/2016  Falls in the past year? 0 0 No No No  Injury with Fall? 0 - - - -   Is the patient's home free of loose throw rugs in walkways, pet beds, electrical cords, etc?   yes      Grab bars in the bathroom? yes      Handrails on the stairs?   yes      Adequate lighting?   yes  Timed Get Up and Go performed: telephone visit, not performed  Depression Screen PHQ 2/9 Scores 07/17/2018 04/08/2018 01/07/2018 06/17/2017  PHQ - 2 Score 0 1 0 2  PHQ- 9 Score - 4 - 6     Cognitive Function     6CIT Screen 07/17/2018  What Year? 0 points  What month? 0 points  What time? 0 points  Count back from 20 0 points  Months in reverse 0 points  Repeat phrase 0 points  Total Score 0    Immunization History  Administered Date(s) Administered  . Influenza Split 01/23/2012  . Influenza Whole 12/18/2005  . Influenza,inj,Quad PF,6+ Mos 12/18/2012, 01/26/2014, 03/15/2015, 01/26/2016, 01/03/2017, 01/07/2018  . Pneumococcal Conjugate-13 10/28/2013  . Pneumococcal Polysaccharide-23 12/18/2012  . Td 08/09/2003  . Tdap 10/05/2014  . Zoster 01/23/2012    Qualifies for Shingles Vaccine? Completed   Screening Tests Health Maintenance  Topic Date Due  . INFLUENZA VACCINE   10/18/2018  . MAMMOGRAM  01/25/2020  . COLONOSCOPY  10/25/2022  . TETANUS/TDAP  10/04/2024  . DEXA SCAN  Completed  . Hepatitis C Screening  Completed  . PNA vac Low Risk Adult  Completed    Cancer Screenings: Lung: Low Dose CT Chest recommended if Age 31-80 years, 30 pack-year currently smoking OR have quit w/in 15years. Patient does not qualify. Breast:  Up to date on Mammogram? Yes   Up to date of Bone Density/Dexa? Yes Colorectal: up to date  Additional Screenings:  Hepatitis C Screening: completed     Plan:    1. Encounter for Medicare annual wellness exam  I have personally reviewed and noted the following in the patient's chart:   . Medical and social history . Use of alcohol, tobacco or illicit drugs  . Current medications and supplements . Functional ability and status . Nutritional status . Physical activity . Advanced directives . List of other physicians . Hospitalizations, surgeries, and ER visits in previous 12 months . Vitals . Screenings to include cognitive, depression, and falls . Referrals and appointments  In addition, I have reviewed and discussed with patient certain preventive protocols, quality metrics, and best practice recommendations. A written personalized care plan for preventive services as well as general preventive health recommendations were provided to patient.   I provided 20 minutes of non-face-to-face time during this encounter.   Perlie Mayo, NP  08/06/2018

## 2018-07-17 NOTE — Patient Instructions (Addendum)
Christina Miles , Thank you for taking time to come for your Medicare Wellness Visit. I appreciate your ongoing commitment to your health goals. Please review the following plan we discussed and let me know if I can assist you in the future.   Screening recommendations/referrals: Colonoscopy: Up to date, next one is due in 2024 Mammogram: Due 2020 Bone Density: Up to date Recommended yearly ophthalmology/optometry visit for glaucoma screening and checkup Recommended yearly dental visit for hygiene and checkup  Vaccinations: Influenza vaccine: Due Fall of 2020 Pneumococcal vaccine: Completed  Tdap vaccine: Up to Date Shingles vaccine: Completed   Preventive Care 36 Years and Older, Female Preventive care refers to lifestyle choices and visits with your health care provider that can promote health and wellness. What does preventive care include?  A yearly physical exam. This is also called an annual well check.  Dental exams once or twice a year.  Routine eye exams. Ask your health care provider how often you should have your eyes checked.  Personal lifestyle choices, including:  Daily care of your teeth and gums.  Regular physical activity.  Eating a healthy diet.  Avoiding tobacco and drug use.  Limiting alcohol use.  Practicing safe sex.  Taking low-dose aspirin every day.  Taking vitamin and mineral supplements as recommended by your health care provider. What happens during an annual well check? The services and screenings done by your health care provider during your annual well check will depend on your age, overall health, lifestyle risk factors, and family history of disease. Counseling  Your health care provider may ask you questions about your:  Alcohol use.  Tobacco use.  Drug use.  Emotional well-being.  Home and relationship well-being.  Sexual activity.  Eating habits.  History of falls.  Memory and ability to understand (cognition).  Work  and work Statistician.  Reproductive health. Screening  You may have the following tests or measurements:  Height, weight, and BMI.  Blood pressure.  Lipid and cholesterol levels. These may be checked every 5 years, or more frequently if you are over 71 years old.  Skin check.  Lung cancer screening. You may have this screening every year starting at age 71 if you have a 30-pack-year history of smoking and currently smoke or have quit within the past 15 years.  Fecal occult blood test (FOBT) of the stool. You may have this test every year starting at age 71.  Flexible sigmoidoscopy or colonoscopy. You may have a sigmoidoscopy every 5 years or a colonoscopy every 10 years starting at age 71.  Hepatitis C blood test.  Hepatitis B blood test.  Sexually transmitted disease (STD) testing.  Diabetes screening. This is done by checking your blood sugar (glucose) after you have not eaten for a while (fasting). You may have this done every 1-3 years.  Bone density scan. This is done to screen for osteoporosis. You may have this done starting at age 71.  Mammogram. This may be done every 1-2 years. Talk to your health care provider about how often you should have regular mammograms. Talk with your health care provider about your test results, treatment options, and if necessary, the need for more tests. Vaccines  Your health care provider may recommend certain vaccines, such as:  Influenza vaccine. This is recommended every year.  Tetanus, diphtheria, and acellular pertussis (Tdap, Td) vaccine. You may need a Td booster every 10 years.  Zoster vaccine. You may need this after age 71.  Pneumococcal 13-valent conjugate (  PCV13) vaccine. One dose is recommended after age 71.  Pneumococcal polysaccharide (PPSV23) vaccine. One dose is recommended after age 71. Talk to your health care provider about which screenings and vaccines you need and how often you need them. This information is  not intended to replace advice given to you by your health care provider. Make sure you discuss any questions you have with your health care provider. Document Released: 04/01/2015 Document Revised: 11/23/2015 Document Reviewed: 01/04/2015 Elsevier Interactive Patient Education  2017 [place] Prevention in the Home Falls can cause injuries. They can happen to people of all ages. There are many things you can do to make your home safe and to help prevent falls. What can I do on the outside of my home?  Regularly fix the edges of walkways and driveways and fix any cracks.  Remove anything that might make you trip as you walk through a door, such as a raised step or threshold.  Trim any bushes or trees on the path to your home.  Use bright outdoor lighting.  Clear any walking paths of anything that might make someone trip, such as rocks or tools.  Regularly check to see if handrails are loose or broken. Make sure that both sides of any steps have handrails.  Any raised decks and porches should have guardrails on the edges.  Have any leaves, snow, or ice cleared regularly.  Use sand or salt on walking paths during winter.  Clean up any spills in your garage right away. This includes oil or grease spills. What can I do in the bathroom?  Use night lights.  Install grab bars by the toilet and in the tub and shower. Do not use towel bars as grab bars.  Use non-skid mats or decals in the tub or shower.  If you need to sit down in the shower, use a plastic, non-slip stool.  Keep the floor dry. Clean up any water that spills on the floor as soon as it happens.  Remove soap buildup in the tub or shower regularly.  Attach bath mats securely with double-sided non-slip rug tape.  Do not have throw rugs and other things on the floor that can make you trip. What can I do in the bedroom?  Use night lights.  Make sure that you have a light by your bed that is easy to reach.   Do not use any sheets or blankets that are too big for your bed. They should not hang down onto the floor.  Have a firm chair that has side arms. You can use this for support while you get dressed.  Do not have throw rugs and other things on the floor that can make you trip. What can I do in the kitchen?  Clean up any spills right away.  Avoid walking on wet floors.  Keep items that you use a lot in easy-to-reach places.  If you need to reach something above you, use a strong step stool that has a grab bar.  Keep electrical cords out of the way.  Do not use floor polish or wax that makes floors slippery. If you must use wax, use non-skid floor wax.  Do not have throw rugs and other things on the floor that can make you trip. What can I do with my stairs?  Do not leave any items on the stairs.  Make sure that there are handrails on both sides of the stairs and use them. Fix handrails that  are broken or loose. Make sure that handrails are as long as the stairways.  Check any carpeting to make sure that it is firmly attached to the stairs. Fix any carpet that is loose or worn.  Avoid having throw rugs at the top or bottom of the stairs. If you do have throw rugs, attach them to the floor with carpet tape.  Make sure that you have a light switch at the top of the stairs and the bottom of the stairs. If you do not have them, ask someone to add them for you. What else can I do to help prevent falls?  Wear shoes that:  Do not have high heels.  Have rubber bottoms.  Are comfortable and fit you well.  Are closed at the toe. Do not wear sandals.  If you use a stepladder:  Make sure that it is fully opened. Do not climb a closed stepladder.  Make sure that both sides of the stepladder are locked into place.  Ask someone to hold it for you, if possible.  Clearly mark and make sure that you can see:  Any grab bars or handrails.  First and last steps.  Where the edge of  each step is.  Use tools that help you move around (mobility aids) if they are needed. These include:  Canes.  Walkers.  Scooters.  Crutches.  Turn on the lights when you go into a dark area. Replace any light bulbs as soon as they burn out.  Set up your furniture so you have a clear path. Avoid moving your furniture around.  If any of your floors are uneven, fix them.  If there are any pets around you, be aware of where they are.  Review your medicines with your doctor. Some medicines can make you feel dizzy. This can increase your chance of falling. Ask your doctor what other things that you can do to help prevent falls. This information is not intended to replace advice given to you by your health care provider. Make sure you discuss any questions you have with your health care provider. Document Released: 12/30/2008 Document Revised: 08/11/2015 Document Reviewed: 04/09/2014 Elsevier Interactive Patient Education  2017 [employer].

## 2018-07-24 ENCOUNTER — Ambulatory Visit: Payer: Medicare Other | Admitting: Family Medicine

## 2018-08-06 ENCOUNTER — Encounter: Payer: Self-pay | Admitting: Family Medicine

## 2018-08-13 ENCOUNTER — Other Ambulatory Visit: Payer: Self-pay

## 2018-08-13 DIAGNOSIS — F329 Major depressive disorder, single episode, unspecified: Secondary | ICD-10-CM

## 2018-08-13 MED ORDER — AMLODIPINE BESYLATE 2.5 MG PO TABS: 2.5000 mg | ORAL_TABLET | Freq: Every day | ORAL | 1 refills | Status: DC

## 2018-08-13 MED ORDER — MIRTAZAPINE 7.5 MG PO TABS: 7.5000 mg | ORAL_TABLET | Freq: Every day | ORAL | 1 refills | Status: DC

## 2018-08-13 MED ORDER — HYDROCHLOROTHIAZIDE 25 MG PO TABS: 25.0000 mg | ORAL_TABLET | Freq: Every day | ORAL | 1 refills | Status: DC

## 2018-09-15 ENCOUNTER — Other Ambulatory Visit: Payer: Self-pay

## 2018-09-15 ENCOUNTER — Encounter (INDEPENDENT_AMBULATORY_CARE_PROVIDER_SITE_OTHER): Payer: Self-pay | Admitting: *Deleted

## 2018-09-15 ENCOUNTER — Ambulatory Visit (INDEPENDENT_AMBULATORY_CARE_PROVIDER_SITE_OTHER): Payer: Medicare Other | Admitting: Internal Medicine

## 2018-09-15 ENCOUNTER — Encounter (INDEPENDENT_AMBULATORY_CARE_PROVIDER_SITE_OTHER): Payer: Self-pay | Admitting: Internal Medicine

## 2018-09-15 VITALS — BP 158/79 | HR 68 | Temp 98.3°F | Ht 62.5 in | Wt 138.8 lb

## 2018-09-15 DIAGNOSIS — B182 Chronic viral hepatitis C: Secondary | ICD-10-CM | POA: Diagnosis not present

## 2018-09-15 NOTE — Patient Instructions (Signed)
Labs and [place]. OV in 1 year.

## 2018-09-15 NOTE — Progress Notes (Signed)
Subjective:    Patient ID: Christina Miles, female    DOB: 04-06-1945, 73 y.o.   MRN: [id number]  HPI Here today for f/u. Last seen in office 09/12/2017. Hx of Hepatitis C successfully treated in 2017 with Epclusa. She underwent a colonoscopy in August of 2019 (personal hx of colonic polyps).  Family hx of colon cancer in 1st degree relative. Impression:               - One small polyp at the splenic flexure. Biopsied.                           - One small polyp in the distal sigmoid colon,                            removed with a cold snare. Resected and retrieved.                           - External hemorrhoids.                           - Anal papilla(e) were hypertrophied. Biopsy: Tubular adenoma. Next colonoscopy 5 yrs. She tells me she is doing good. No GI complaints. Her appetite has been good. She has lost 5 pounds. Her BMs move okay.  No melena or BRRB.   CBC    Component Value Date/Time   WBC 4.5 09/12/2017 0914   RBC 4.23 09/12/2017 0914   HGB 12.9 09/12/2017 0914   HCT 39.8 09/12/2017 0914   PLT 268 09/12/2017 0914   MCV 94.1 09/12/2017 0914   MCH 30.5 09/12/2017 0914   MCHC 32.4 09/12/2017 0914   RDW 12.2 09/12/2017 0914   LYMPHSABS 2,151 09/12/2017 0914   MONOABS 230 09/03/2016 1141   EOSABS 18 09/12/2017 0914   BASOSABS 32 09/12/2017 0914   Hepatic Function Latest Ref Rng & Units 04/07/2018 09/12/2017 06/13/2017  Total Protein 6.1 - 8.1 g/dL 7.9 7.9 7.2  Albumin 3.6 - 5.1 g/dL - - -  AST 10 - 35 U/L '19 14 15  '$ ALT 6 - 29 U/L '12 9 10  '$ Alk Phosphatase 33 - 130 U/L - - -  Total Bilirubin 0.2 - 1.2 mg/dL 0.6 0.6 0.9  Bilirubin, Direct 0.0 - 0.2 mg/dL - 0.1 -     Review of Systems Past Medical History:  Diagnosis Date  . Depression 2014  . Hepatitis   . Hyperlipidemia   . Hypertension   . Reduced vision 2015   right, had hemmorhage, treated by [name], injections    Past Surgical History:  Procedure Laterality Date  . ABDOMINAL HYSTERECTOMY  1985   .  ? mass on ovary  . BACK SURGERY    . CHOLECYSTECTOMY    . COLONOSCOPY  02/20/2012   Procedure: COLONOSCOPY;  Surgeon: Rogene Houston, MD;  [facility];  Service: Endoscopy;  Laterality: N/A;  930  . COLONOSCOPY     Patient states that this was 3-4 years ago '@[facility]'$   . COLONOSCOPY N/A 10/24/2017   Procedure: COLONOSCOPY;  Surgeon: Rogene Houston, MD;  [facility];  Service: Endoscopy;  Laterality: N/A;  1300  . GLAUCOMA SURGERY     05/2017  . POLYPECTOMY  10/24/2017   Procedure: POLYPECTOMY;  Surgeon: Rogene Houston, MD;  [facility];  Service: Endoscopy;;  colon  . SPINE SURGERY  1998 approx   [place]    Allergies  Allergen Reactions  . Ace Inhibitors Cough    Current Outpatient Medications on File Prior to Visit  Medication Sig Dispense Refill  . amLODipine (NORVASC) 2.5 MG tablet Take 1 tablet (2.5 mg total) by mouth daily. 90 tablet 1  . aspirin 81 MG tablet Take 1 tablet (81 mg total) by mouth daily. 30 tablet   . Calcium Carbonate-Vitamin D (CALTRATE 600+D) 600-400 MG-UNIT per tablet Take 2 tablets by mouth daily.    . fluticasone (FLONASE) 50 MCG/ACT nasal spray PLACE 2 SPRAYS INTO THE NOSE DAILY. (Patient taking differently: PLACE 2 SPRAYS INTO THE NOSE DAILY AS NEEDED FOR ALLERGIES) 16 g 5  . hydrochlorothiazide (HYDRODIURIL) 25 MG tablet Take 1 tablet (25 mg total) by mouth daily. 90 tablet 1  . KLOR-CON M10 10 MEQ tablet TAKE 1 TABLET BY MOUTH TWICE A DAY (Patient taking differently: TAKE 20MEQ TABLET BY MOUTH DAILY) 180 tablet 1  . latanoprost (XALATAN) 0.005 % ophthalmic solution Place 1 drop into the left eye at bedtime.     . mirtazapine (REMERON) 7.5 MG tablet Take 1 tablet (7.5 mg total) by mouth at bedtime. 90 tablet 1  . prednisoLONE acetate (PRED FORTE) 1 % ophthalmic suspension Place 1 drop into the right eye every other day.   0  . benzonatate (TESSALON) 100 MG capsule Take 1 capsule (100 mg total) by mouth 2 (two) times daily as  needed. (Patient not taking: Reported on 09/15/2018) 14 capsule 0  . hydrOXYzine (ATARAX/VISTARIL) 10 MG tablet One tablet two times daily as needed for utching (Patient not taking: Reported on 09/15/2018) 20 tablet 0  . penicillin v potassium (VEETID) 500 MG tablet Take 1 tablet (500 mg total) by mouth 3 (three) times daily. (Patient not taking: Reported on 09/15/2018) 30 tablet 0  . potassium chloride (K-DUR) 10 MEQ tablet TAKE ONE TABLET BY MOUTH TWICE DAILY. (Patient not taking: Reported on 09/15/2018) 180 tablet 0   No current facility-administered medications on file prior to visit.         Objective:   Physical Exam Blood pressure (!) 158/79, pulse 68, temperature 98.3 F (36.8 C), height 5' 2.5" (1.588 m), weight 138 lb 12.8 oz (63 kg). Alert and oriented. Skin warm and dry. Oral mucosa is moist.   . Sclera anicteric, conjunctivae is pink. Thyroid not enlarged. No cervical lymphadenopathy. Lungs clear. Heart regular rate and rhythm.  Abdomen is soft. Bowel sounds are positive. No hepatomegaly. No abdominal masses felt. No tenderness.  No edema to lower extremities.         Assessment & Plan:  Hepatitis C carrier. Will get a CBC, Hepatic, AFP [place] RUQ. OV in 1 year.

## 2018-09-16 LAB — CBC WITH DIFFERENTIAL/PLATELET
Absolute Monocytes: 302 cells/uL (ref 200–950)
Basophils Absolute: 50 cells/uL (ref 0–200)
Basophils Relative: 1.1 %
Eosinophils Absolute: 32 cells/uL (ref 15–500)
Eosinophils Relative: 0.7 %
HCT: 38.1 % (ref 35.0–45.0)
Hemoglobin: 12.7 g/dL (ref 11.7–15.5)
Lymphs Abs: 1989 cells/uL (ref 850–3900)
MCH: 30.8 pg (ref 27.0–33.0)
MCHC: 33.3 g/dL (ref 32.0–36.0)
MCV: 92.5 fL (ref 80.0–100.0)
MPV: 10.5 fL (ref 7.5–12.5)
Monocytes Relative: 6.7 %
Neutro Abs: 2129 cells/uL (ref 1500–7800)
Neutrophils Relative %: 47.3 %
Platelets: 259 10*3/uL (ref 140–400)
RBC: 4.12 10*6/uL (ref 3.80–5.10)
RDW: 12 % (ref 11.0–15.0)
Total Lymphocyte: 44.2 %
WBC: 4.5 10*3/uL (ref 3.8–10.8)

## 2018-09-16 LAB — HEPATIC FUNCTION PANEL
AG Ratio: 1.3 (calc) (ref 1.0–2.5)
ALT: 13 U/L (ref 6–29)
AST: 17 U/L (ref 10–35)
Albumin: 4.2 g/dL (ref 3.6–5.1)
Alkaline phosphatase (APISO): 71 U/L (ref 37–153)
Bilirubin, Direct: 0.2 mg/dL (ref 0.0–0.2)
Globulin: 3.2 g/dL (calc) (ref 1.9–3.7)
Indirect Bilirubin: 0.5 mg/dL (calc) (ref 0.2–1.2)
Total Bilirubin: 0.7 mg/dL (ref 0.2–1.2)
Total Protein: 7.4 g/dL (ref 6.1–8.1)

## 2018-09-16 LAB — AFP TUMOR MARKER: AFP-Tumor Marker: 17 ng/mL — ABNORMAL HIGH

## 2018-09-18 ENCOUNTER — Other Ambulatory Visit: Payer: Self-pay

## 2018-09-18 ENCOUNTER — Ambulatory Visit (HOSPITAL_COMMUNITY)
Admission: RE | Admit: 2018-09-18 | Discharge: 2018-09-18 | Disposition: A | Discharge status: Home / Self Care | Payer: Medicare Other | Source: Ambulatory Visit | Attending: Internal Medicine | Admitting: Internal Medicine

## 2018-09-18 DIAGNOSIS — B1920 Unspecified viral hepatitis C without hepatic coma: Secondary | ICD-10-CM | POA: Diagnosis not present

## 2018-09-18 DIAGNOSIS — B182 Chronic viral hepatitis C: Secondary | ICD-10-CM | POA: Insufficient documentation

## 2018-12-01 DIAGNOSIS — H401112 Primary open-angle glaucoma, right eye, moderate stage: Secondary | ICD-10-CM | POA: Diagnosis not present

## 2018-12-01 DIAGNOSIS — H401121 Primary open-angle glaucoma, left eye, mild stage: Secondary | ICD-10-CM | POA: Diagnosis not present

## 2018-12-01 DIAGNOSIS — H348312 Tributary (branch) retinal vein occlusion, right eye, stable: Secondary | ICD-10-CM | POA: Diagnosis not present

## 2018-12-01 DIAGNOSIS — H2513 Age-related nuclear cataract, bilateral: Secondary | ICD-10-CM | POA: Diagnosis not present

## 2018-12-23 ENCOUNTER — Other Ambulatory Visit: Payer: Self-pay

## 2018-12-23 ENCOUNTER — Ambulatory Visit (INDEPENDENT_AMBULATORY_CARE_PROVIDER_SITE_OTHER): Payer: Medicare Other | Admitting: Family Medicine

## 2018-12-23 ENCOUNTER — Encounter: Payer: Self-pay | Admitting: Family Medicine

## 2018-12-23 VITALS — BP 138/78 | HR 82 | Temp 97.6°F | Ht 62.5 in | Wt 141.0 lb

## 2018-12-23 DIAGNOSIS — Z0000 Encounter for general adult medical examination without abnormal findings: Secondary | ICD-10-CM

## 2018-12-23 DIAGNOSIS — Z1231 Encounter for screening mammogram for malignant neoplasm of breast: Secondary | ICD-10-CM

## 2018-12-23 DIAGNOSIS — Z23 Encounter for immunization: Secondary | ICD-10-CM

## 2018-12-23 DIAGNOSIS — M5410 Radiculopathy, site unspecified: Secondary | ICD-10-CM | POA: Diagnosis not present

## 2018-12-23 DIAGNOSIS — E7849 Other hyperlipidemia: Secondary | ICD-10-CM

## 2018-12-23 DIAGNOSIS — R772 Abnormality of alphafetoprotein: Secondary | ICD-10-CM

## 2018-12-23 DIAGNOSIS — E559 Vitamin D deficiency, unspecified: Secondary | ICD-10-CM

## 2018-12-23 DIAGNOSIS — I10 Essential (primary) hypertension: Secondary | ICD-10-CM

## 2018-12-23 MED ORDER — IBUPROFEN 800 MG PO TABS: ORAL_TABLET | ORAL | 0 refills | Status: DC

## 2018-12-23 MED ORDER — PREDNISONE 5 MG PO TABS: 5.0000 mg | ORAL_TABLET | Freq: Two times a day (BID) | ORAL | 0 refills | Status: AC

## 2018-12-23 MED ORDER — FAMOTIDINE 40 MG PO TABS: 40.0000 mg | ORAL_TABLET | Freq: Every day | ORAL | 0 refills | Status: DC

## 2018-12-23 NOTE — Assessment & Plan Note (Signed)

## 2018-12-23 NOTE — Assessment & Plan Note (Addendum)
3 month history Ibuprofen and prednisone short term, if persists X ray and further eval

## 2018-12-23 NOTE — Progress Notes (Signed)
    Christina Miles     MRN: [id number]      DOB: Oct 16, 1945  HPI: Patient is in for annual physical exam. LBP  3 month, with right groin pain catch when standing sometimes, discomfort rated at 2 Recent labs, if available are reviewed. Immunization is reviewed , and  updated  PE: BP 138/78   Pulse 82   Temp 97.6 F (36.4 C) (Temporal)   Ht 5' 2.5" (1.588 m)   Wt 141 lb (64 kg)   SpO2 98%   BMI 25.38 kg/m  Pleasant  female, alert and oriented x 3, in no cardio-pulmonary distress. Afebrile. HEENT No facial trauma or asymetry. Sinuses non tender.  Extra occullar muscles intact,  Neck: supple, no adenopathy,JVD or thyromegaly.No bruits.  Chest: Clear to ascultation bilaterally.No crackles or wheezes. Non tender to palpation  Breast: No asymetry,no masses or lumps. No tenderness. No nipple discharge or inversion. No axillary or supraclavicular adenopathy  Cardiovascular system; Heart sounds normal,  S1 and  S2 ,no S3.  No murmur, or thrill. Apical beat not displaced Peripheral pulses normal.  Abdomen: Soft, non tender, no organomegaly or masses. No bruits. Bowel sounds normal. No guarding, tenderness or rebound.  Rectal:  Normal sphincter tone. No rectal mass. Guaiac negative stool.    Musculoskeletal exam: Adequate though reduced ROM of spine, hips , shoulders and knees. No deformity ,swelling or crepitus noted. No muscle wasting or atrophy.   Neurologic: Cranial nerves 2 to 12 intact. Power, tone ,sensation and reflexes normal throughout. No disturbance in gait. No tremor.  Skin: Intact, no ulceration, erythema , scaling or rash noted. Pigmentation normal throughout  Psych; Normal mood and affect. Judgement and concentration normal   Assessment & Plan:  Annual physical exam Annual exam as documented. Counseling done  re healthy lifestyle involving commitment to 150 minutes exercise per week, heart healthy diet, and attaining healthy weight.The  importance of adequate sleep also discussed. Regular seat belt use and home safety, is also discussed. Changes in health habits are decided on by the patient with goals and time frames  set for achieving them. Immunization and cancer screening needs are specifically addressed at this visit.   Back pain with right-sided radiculopathy 3 month history Ibuprofen and prednisone short term, if persists X ray and further eval  Elevated AFP Will reach out to GI re follow up, has 1 year appt

## 2018-12-23 NOTE — Patient Instructions (Signed)
F/U in office with MD end January , cal;l if you need me before.  Please schedule mammogram at checkout due in November 2020.  Flu vaccine in office today.  Taper off of Remeron for sleep instead practice sleep hygiene.  Take 2 three 1 tablet 3 times daily for this week then 1 tablet 2 times daily next week then stop.  If needed you may use Tylenol PM or Benadryl at bedtime to help with sleep.  For back pain 5-day course of ibuprofen and prednisone along with Pepcid is prescribed.  If this persists call you will have an x-ray done and we will take it from there..  Please work on changing food choices cutting back portion sizes of starchy foods and sweets and commit to at least 30 minutes every day of physical activity for health 3 to 5 pound weight loss will put you in a healthier weight range.  Fasting lipid Chem-7 and EGFR TSH and vitamin D to be obtained 1 week before your follow-up in January.   Thanks for choosing [facility], we consider it a privelige to serve you.

## 2018-12-24 DIAGNOSIS — R772 Abnormality of alphafetoprotein: Secondary | ICD-10-CM | POA: Insufficient documentation

## 2018-12-24 NOTE — Assessment & Plan Note (Signed)
Will reach out to GI re follow up, has 1 year appt

## 2019-01-05 ENCOUNTER — Other Ambulatory Visit (INDEPENDENT_AMBULATORY_CARE_PROVIDER_SITE_OTHER): Payer: Self-pay | Admitting: *Deleted

## 2019-01-05 DIAGNOSIS — R772 Abnormality of alphafetoprotein: Secondary | ICD-10-CM

## 2019-01-07 DIAGNOSIS — R772 Abnormality of alphafetoprotein: Secondary | ICD-10-CM | POA: Diagnosis not present

## 2019-01-08 LAB — AFP TUMOR MARKER: AFP-Tumor Marker: 13.4 ng/mL — ABNORMAL HIGH

## 2019-01-12 DIAGNOSIS — H348312 Tributary (branch) retinal vein occlusion, right eye, stable: Secondary | ICD-10-CM | POA: Diagnosis not present

## 2019-01-12 DIAGNOSIS — H401121 Primary open-angle glaucoma, left eye, mild stage: Secondary | ICD-10-CM | POA: Diagnosis not present

## 2019-01-12 DIAGNOSIS — H401112 Primary open-angle glaucoma, right eye, moderate stage: Secondary | ICD-10-CM | POA: Diagnosis not present

## 2019-01-12 DIAGNOSIS — H2513 Age-related nuclear cataract, bilateral: Secondary | ICD-10-CM | POA: Diagnosis not present

## 2019-01-20 ENCOUNTER — Other Ambulatory Visit (INDEPENDENT_AMBULATORY_CARE_PROVIDER_SITE_OTHER): Payer: Self-pay | Admitting: *Deleted

## 2019-01-20 DIAGNOSIS — R772 Abnormality of alphafetoprotein: Secondary | ICD-10-CM

## 2019-01-28 ENCOUNTER — Other Ambulatory Visit: Payer: Self-pay

## 2019-01-28 ENCOUNTER — Encounter (HOSPITAL_COMMUNITY): Payer: Self-pay

## 2019-01-28 ENCOUNTER — Ambulatory Visit (HOSPITAL_COMMUNITY)
Admission: RE | Admit: 2019-01-28 | Discharge: 2019-01-28 | Disposition: A | Discharge status: Home / Self Care | Payer: Medicare Other | Source: Ambulatory Visit | Attending: Family Medicine | Admitting: Family Medicine

## 2019-01-28 DIAGNOSIS — Z1231 Encounter for screening mammogram for malignant neoplasm of breast: Secondary | ICD-10-CM

## 2019-03-11 ENCOUNTER — Other Ambulatory Visit: Payer: Self-pay | Admitting: Family Medicine

## 2019-03-20 ENCOUNTER — Encounter: Payer: Self-pay | Admitting: Family Medicine

## 2019-03-20 LAB — BASIC METABOLIC PANEL WITH GFR
BUN/Creatinine Ratio: 20 (calc) (ref 6–22)
BUN: 21 mg/dL (ref 7–25)
CO2: 33 mmol/L — ABNORMAL HIGH (ref 20–32)
Calcium: 10.2 mg/dL (ref 8.6–10.4)
Chloride: 101 mmol/L (ref 98–110)
Creat: 1.04 mg/dL — ABNORMAL HIGH (ref 0.60–0.93)
GFR, Est African American: 63 mL/min/{1.73_m2} (ref >=60)
GFR, Est Non African American: 54 mL/min/{1.73_m2} — ABNORMAL LOW (ref >=60)
Glucose, Bld: 102 mg/dL — ABNORMAL HIGH (ref 65–99)
Potassium: 4.6 mmol/L (ref 3.5–5.3)
Sodium: 139 mmol/L (ref 135–146)

## 2019-03-20 LAB — LIPID PANEL
Cholesterol: 259 mg/dL — ABNORMAL HIGH (ref <200)
HDL: 81 mg/dL (ref >=50)
LDL Cholesterol (Calc): 161 mg/dL (calc) — ABNORMAL HIGH
Non-HDL Cholesterol (Calc): 178 mg/dL (calc) — ABNORMAL HIGH (ref <130)
Total CHOL/HDL Ratio: 3.2 (calc) (ref <5.0)
Triglycerides: 73 mg/dL (ref <150)

## 2019-03-20 LAB — TSH: TSH: 1.95 mIU/L (ref 0.40–4.50)

## 2019-03-20 LAB — VITAMIN D 25 HYDROXY (VIT D DEFICIENCY, FRACTURES): Vit D, 25-Hydroxy: 18 ng/mL — ABNORMAL LOW (ref 30–100)

## 2019-03-26 ENCOUNTER — Ambulatory Visit (INDEPENDENT_AMBULATORY_CARE_PROVIDER_SITE_OTHER): Payer: Medicare Other | Admitting: Family Medicine

## 2019-03-26 ENCOUNTER — Other Ambulatory Visit: Payer: Self-pay

## 2019-03-26 ENCOUNTER — Encounter: Payer: Self-pay | Admitting: Family Medicine

## 2019-03-26 VITALS — BP 124/74 | HR 67 | Ht 62.0 in | Wt 128.0 lb

## 2019-03-26 DIAGNOSIS — E7849 Other hyperlipidemia: Secondary | ICD-10-CM

## 2019-03-26 DIAGNOSIS — I10 Essential (primary) hypertension: Secondary | ICD-10-CM

## 2019-03-26 DIAGNOSIS — M542 Cervicalgia: Secondary | ICD-10-CM | POA: Diagnosis not present

## 2019-03-26 DIAGNOSIS — F322 Major depressive disorder, single episode, severe without psychotic features: Secondary | ICD-10-CM

## 2019-03-26 DIAGNOSIS — F411 Generalized anxiety disorder: Secondary | ICD-10-CM

## 2019-03-26 DIAGNOSIS — F5104 Psychophysiologic insomnia: Secondary | ICD-10-CM | POA: Diagnosis not present

## 2019-03-26 DIAGNOSIS — F324 Major depressive disorder, single episode, in partial remission: Secondary | ICD-10-CM | POA: Insufficient documentation

## 2019-03-26 MED ORDER — HYDROCHLOROTHIAZIDE 25 MG PO TABS: ORAL_TABLET | ORAL | 1 refills | Status: DC

## 2019-03-26 MED ORDER — PREDNISONE 20 MG PO TABS: 20.0000 mg | ORAL_TABLET | Freq: Two times a day (BID) | ORAL | 0 refills | Status: DC

## 2019-03-26 MED ORDER — IBUPROFEN 800 MG PO TABS: ORAL_TABLET | ORAL | 0 refills | Status: DC

## 2019-03-26 MED ORDER — AMLODIPINE BESYLATE 2.5 MG PO TABS: 2.5000 mg | ORAL_TABLET | Freq: Every day | ORAL | 1 refills | Status: DC

## 2019-03-26 MED ORDER — MIRTAZAPINE 7.5 MG PO TABS: 7.5000 mg | ORAL_TABLET | Freq: Every day | ORAL | 5 refills | Status: DC

## 2019-03-26 MED ORDER — PANTOPRAZOLE SODIUM 20 MG PO TBEC: DELAYED_RELEASE_TABLET | ORAL | 0 refills | Status: DC

## 2019-03-26 MED ORDER — POTASSIUM CHLORIDE ER 10 MEQ PO TBCR: 10.0000 meq | EXTENDED_RELEASE_TABLET | Freq: Two times a day (BID) | ORAL | 1 refills | Status: DC

## 2019-03-26 NOTE — Assessment & Plan Note (Signed)
Sleep hygiene reviewed and written information offered also. Prescription sent for  medication needed. Remeron resumed

## 2019-03-26 NOTE — Progress Notes (Signed)
Virtual Visit via Telephone Note  I connected with Christina Miles on 03/26/19 at  8:20 AM EST by telephone and verified that I am speaking with the correct person using two identifiers.  Location: Patient: HOME  Provider: OFFICE   I discussed the limitations, risks, security and privacy concerns of performing an evaluation and management service by telephone and the availability of in person appointments. I also discussed with the patient that there may be a patient responsible charge related to this service. The patient expressed understanding and agreed to proceed.   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 PAIN DOES NOT GO DOWN SHOULDER ,  MAINLY IN LEFT UPPER NECK Denies recent fever or chills. Denies sinus pressure, nasal congestion, ear pain or sore throat. Denies chest congestion, productive cough or wheezing. Denies chest pains, palpitations and leg swelling Denies abdominal pain, nausea, vomiting,diarrhea or constipation.   Denies dysuria, frequency, hesitancy or incontinence.  Denies headaches, seizures, numbness, or tingling.  Denies skin break down or rash.      Observations/Objective: BP 124/74   Pulse 67   Ht 5\' 2"  (1.575 m)   Wt 128 lb (58.1 kg)   BMI 23.41 kg/m  \Good communication with no confusion and intact memory. Alert and oriented x 3 No signs of respiratory distress during speech    Assessment and Plan: Depression, major, single episode, severe (HCC) RESUME REMERON 7.5 MG DAILY, THERAPY OFFERED , BUT NO INTEREST AT THIS TIME  Hyperlipidemia Hyperlipidemia:Low fat diet discussed and encouraged.   Lipid Panel  Lab Results  Component Value Date   CHOL 259 (H) 03/19/2019   HDL 81 03/19/2019   LDLCALC 161 (H) 03/19/2019   TRIG 73 03/19/2019   CHOLHDL 3.2 03/19/2019  Deteriorated, opts to follow low fat diet before starting meds     Insomnia Sleep hygiene reviewed and written information offered also. Prescription sent for  medication needed. Remeron resumed  GAD (generalized anxiety disorder) Resume remeron  Essential hypertension Controlled, no change in medication DASH diet and commitment to daily physical activity for a minimum of 30 minutes discussed and encouraged, as a part of hypertension management. The importance of attaining a healthy weight is also discussed.  BP/Weight 03/26/2019 12/23/2018 09/15/2018 07/17/2018 04/08/2018 Q000111Q [id number]  Systolic BP A999333 0000000 0000000 [id number]  Diastolic BP 74 78 79 80 80 80 61  Wt. 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 ibuprofen and prednisone short term and X ray of neck    Follow Up Instructions:    I discussed the assessment and treatment plan with the patient. The patient was provided an opportunity to ask questions and all were answered. The patient agreed with the plan and demonstrated an understanding of the instructions.   The patient was advised to call back or seek an in-person evaluation if the symptoms worsen or if the condition fails to improve as anticipated.  I provided 22 minutes of non-face-to-face time during this encounter.   Tula Nakayama, MD

## 2019-03-26 NOTE — Assessment & Plan Note (Signed)
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 ibuprofen and prednisone short term and X ray of neck

## 2019-03-26 NOTE — Assessment & Plan Note (Signed)
RESUME REMERON 7.5 MG DAILY, THERAPY OFFERED , BUT NO INTEREST AT THIS TIME

## 2019-03-26 NOTE — Patient Instructions (Addendum)
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 The human body needs small amounts of cholesterol. The liver makes all the cholesterol that the body needs. Extra (excess) cholesterol comes from the food that we eat. Cholesterol is carried from the liver by the blood through the blood vessels. If you have high cholesterol, deposits (plaques) may build up on the walls of your blood vessels (arteries). Plaques make the arteries narrower and stiffer. Cholesterol plaques increase your risk for heart attack and stroke. Work with your health care provider to keep your cholesterol levels in a healthy range. What increases the risk? This condition is more likely to develop in people who:  Eat foods that are high in animal fat (saturated fat) or cholesterol.  Are overweight.  Are not getting enough exercise.  Have a family history of high cholesterol. What are the signs or symptoms? There are no symptoms of this condition. How is this diagnosed? This condition may be diagnosed from the results of a blood test.  If you are older than age 21, your health care provider may check your cholesterol every 4-6 years.  You may be checked more often if you already have high cholesterol or other risk factors for heart disease. The blood test for cholesterol measures:  "Bad" cholesterol (LDL cholesterol). This is the main type of cholesterol that causes heart disease. The desired level for LDL is less  than 100.  "Good" cholesterol (HDL cholesterol). This type helps to protect against heart disease by cleaning the arteries and carrying the LDL away. The desired level for HDL is 60 or higher.  Triglycerides. These are fats that the body can store or burn for energy. The desired number for triglycerides is lower than 150.  Total cholesterol. This is a measure of the total amount of cholesterol in your blood, including LDL cholesterol, HDL cholesterol, and triglycerides. A healthy number is less than 200. How is this treated? This condition is treated with diet changes, lifestyle changes, and medicines. Diet changes  This may include eating more whole grains, fruits, vegetables, nuts, and fish.  This may also include cutting back on red meat and foods that have a lot of added sugar. Lifestyle changes  Changes may include getting at least 40 minutes of aerobic exercise 3 times a week. Aerobic exercises include walking, biking, and swimming. Aerobic exercise along with a healthy diet can help you maintain a healthy weight.  Changes may also include quitting smoking. Medicines  Medicines are usually given if diet and lifestyle changes have failed to reduce your cholesterol to healthy levels.  Your health care provider may prescribe a statin medicine. Statin medicines have been shown to reduce cholesterol, which can reduce the risk of heart disease. Follow these instructions at home: Eating and drinking If told by your health care provider:  Eat chicken (without skin), fish, veal, shellfish, ground [place] breast, and round or loin cuts of  red meat.  Do not eat fried foods or fatty meats, such as hot dogs and salami.  Eat plenty of fruits, such as apples.  Eat plenty of vegetables, such as broccoli, potatoes, and carrots.  Eat beans, peas, and lentils.  Eat grains such as barley, rice, couscous, and bulgur wheat.  Eat pasta without cream sauces.  Use skim or nonfat milk, and eat  low-fat or nonfat yogurt and cheeses.  Do not eat or drink whole milk, cream, ice cream, egg yolks, or hard cheeses.  Do not eat stick margarine or tub margarines that contain trans fats (also called partially hydrogenated oils).  Do not eat saturated tropical oils, such as coconut oil and palm oil.  Do not eat cakes, cookies, crackers, or other baked goods that contain trans fats.  General instructions  Exercise as directed by your health care provider. Increase your activity level with activities such as gardening, walking, and taking the stairs.  Take over-the-counter and prescription medicines only as told by your health care provider.  Do not use any products that contain nicotine or tobacco, such as cigarettes and e-cigarettes. If you need help quitting, ask your health care provider.  Keep all follow-up visits as told by your health care provider. This is important. Contact a health care provider if:  You are struggling to maintain a healthy diet or weight.  You need help to start on an exercise program.  You need help to stop smoking. Get help right away if:  You have chest pain.  You have trouble breathing. This information is not intended to replace advice given to you by your health care provider. Make sure you discuss any questions you have with your health care provider. Document Revised: 03/08/2017 Document Reviewed: 09/03/2015 Elsevier Patient Education  [place].

## 2019-03-26 NOTE — Assessment & Plan Note (Signed)
Hyperlipidemia:Low fat diet discussed and encouraged.   Lipid Panel  Lab Results  Component Value Date   CHOL 259 (H) 03/19/2019   HDL 81 03/19/2019   LDLCALC 161 (H) 03/19/2019   TRIG 73 03/19/2019   CHOLHDL 3.2 03/19/2019  Deteriorated, opts to follow low fat diet before starting meds

## 2019-03-26 NOTE — Assessment & Plan Note (Signed)
Resume remeron

## 2019-03-26 NOTE — Assessment & Plan Note (Signed)
Controlled, no change in medication DASH diet and commitment to daily physical activity for a minimum of 30 minutes discussed and encouraged, as a part of hypertension management. The importance of attaining a healthy weight is also discussed.  BP/Weight 03/26/2019 12/23/2018 09/15/2018 07/17/2018 04/08/2018 Q000111Q [id number]  Systolic BP A999333 0000000 0000000 123456 123456 [id number]  Diastolic BP 74 78 79 80 80 80 61  Wt. (Lbs) 128 141 138.8 133 133 143.12 136  BMI 23.41 25.38 24.98 23.94 23.94 25.76 24.87

## 2019-03-31 ENCOUNTER — Ambulatory Visit (HOSPITAL_COMMUNITY)
Admission: RE | Admit: 2019-03-31 | Discharge: 2019-03-31 | Disposition: A | Discharge status: Home / Self Care | Payer: Medicare Other | Source: Ambulatory Visit | Attending: Family Medicine | Admitting: Family Medicine

## 2019-03-31 ENCOUNTER — Other Ambulatory Visit: Payer: Self-pay

## 2019-03-31 DIAGNOSIS — M542 Cervicalgia: Secondary | ICD-10-CM

## 2019-04-01 ENCOUNTER — Encounter: Payer: Self-pay | Admitting: Family Medicine

## 2019-04-13 ENCOUNTER — Other Ambulatory Visit (INDEPENDENT_AMBULATORY_CARE_PROVIDER_SITE_OTHER): Payer: Self-pay | Admitting: *Deleted

## 2019-04-13 DIAGNOSIS — R772 Abnormality of alphafetoprotein: Secondary | ICD-10-CM

## 2019-05-09 ENCOUNTER — Ambulatory Visit: Payer: Medicare Other | Attending: Internal Medicine

## 2019-05-09 DIAGNOSIS — Z23 Encounter for immunization: Secondary | ICD-10-CM

## 2019-05-09 NOTE — Progress Notes (Signed)
   Covid-19 Vaccination Clinic  Name:  Christina Miles    MRN: [id number] DOB: November 15, 1945  05/09/2019  [name] was observed post Covid-19 immunization for 15 minutes without incidence. She was provided with Vaccine Information Sheet and instruction to access the V-Safe system.   [name] was instructed to call 911 with any severe reactions post vaccine: Marland Kitchen Difficulty breathing  . Swelling of your face and throat  . A fast heartbeat  . A bad rash all over your body  . Dizziness and weakness    Immunizations Administered    Name Date Dose VIS Date Route   Pfizer COVID-19 Vaccine 05/09/2019  1:09 PM 0.3 mL 02/27/2019 Intramuscular   Manufacturer: [place]   Lot: [id number]   [place]: [id number]

## 2019-05-19 ENCOUNTER — Ambulatory Visit (INDEPENDENT_AMBULATORY_CARE_PROVIDER_SITE_OTHER): Payer: Medicare Other | Admitting: Family Medicine

## 2019-05-19 ENCOUNTER — Other Ambulatory Visit: Payer: Self-pay

## 2019-05-19 ENCOUNTER — Encounter: Payer: Self-pay | Admitting: Family Medicine

## 2019-05-19 VITALS — BP 126/68 | HR 63 | Ht 62.0 in | Wt 138.0 lb

## 2019-05-19 DIAGNOSIS — Z6825 Body mass index (BMI) 25.0-25.9, adult: Secondary | ICD-10-CM

## 2019-05-19 DIAGNOSIS — E7849 Other hyperlipidemia: Secondary | ICD-10-CM

## 2019-05-19 DIAGNOSIS — E663 Overweight: Secondary | ICD-10-CM | POA: Diagnosis not present

## 2019-05-19 DIAGNOSIS — I10 Essential (primary) hypertension: Secondary | ICD-10-CM

## 2019-05-19 DIAGNOSIS — F322 Major depressive disorder, single episode, severe without psychotic features: Secondary | ICD-10-CM

## 2019-05-19 NOTE — Assessment & Plan Note (Signed)
Controlled, no change in medication DASH diet and commitment to daily physical activity for a minimum of 30 minutes discussed and encouraged, as a part of hypertension management. The importance of attaining a healthy weight is also discussed.  BP/Weight 05/19/2019 03/26/2019 12/23/2018 09/15/2018 07/17/2018 04/08/2018 Q000111Q  Systolic BP 123XX123 A999333 0000000 0000000 123456 123456 [id number]  Diastolic BP 68 74 78 79 80 80 80  Wt. (Lbs) 138 128 141 138.8 133 133 143.12  BMI 25.24 23.41 25.38 24.98 23.94 23.94 25.76

## 2019-05-19 NOTE — Assessment & Plan Note (Signed)
  Patient re-educated about  the importance of commitment to a  minimum of 150 minutes of exercise per week as able.  The importance of healthy food choices with portion control discussed, as well as eating regularly and within a 12 hour window most days. The need to choose "clean , green" food 50 to 75% of the time is discussed, as well as to make water the primary drink and set a goal of 64 ounces water daily.    Weight /BMI 05/19/2019 03/26/2019 12/23/2018  WEIGHT 138 lb 128 lb 141 lb  HEIGHT 5\' 2"  5\' 2"  5' 2.5"  BMI 25.24 kg/m2 23.41 kg/m2 25.38 kg/m2

## 2019-05-19 NOTE — Patient Instructions (Addendum)
Wellness with NP overdue , please schedule  Please schedule  appt in office with MD for Annual exam in October, when it is due  Please get fasting lipid, cmp and eGFr, CBC in 6 months  It is important that you exercise regularly at least 30 minutes 5 times a week. If you develop chest pain, have severe difficulty breathing, or feel very tired, stop exercising immediately and seek medical attention   Think about what you will eat, plan ahead. Choose " clean, green, fresh or frozen" over canned, processed or packaged foods which are more sugary, salty and fatty. 70 to 75% of food eaten should be vegetables and fruit. Three meals at set times with snacks allowed between meals, but they must be fruit or vegetables. Aim to eat over a 12 hour period , example 7 am to 7 pm, and STOP after  your last meal of the day. Drink water,generally about 64 ounces per day, no other drink is as healthy. Fruit juice is best enjoyed in a healthy way, by EATING the fruit.   Thanks for choosing [facility], we consider it a privelige to serve you.

## 2019-05-19 NOTE — Assessment & Plan Note (Signed)
Hyperlipidemia:Low fat diet discussed and encouraged.   Lipid Panel  Lab Results  Component Value Date   CHOL 259 (H) 03/19/2019   HDL 81 03/19/2019   LDLCALC 161 (H) 03/19/2019   TRIG 73 03/19/2019   CHOLHDL 3.2 03/19/2019   Needs to reduce fat in diet, stating not being used , has liver  disease

## 2019-05-19 NOTE — Assessment & Plan Note (Signed)
Marked improvement , essentially witrh resolution on current med, continue same

## 2019-05-19 NOTE — Progress Notes (Signed)
Virtual Visit via Telephone Note  I connected with Christina Miles on 05/19/19 at  2:00 PM EST by telephone and verified that I am speaking with the correct person using two identifiers.  Location: Patient: home Provider: office   I discussed the limitations, risks, security and privacy concerns of performing an evaluation and management service by telephone and the availability of in person appointments. I also discussed with the patient that there may be a patient responsible charge related to this service. The patient expressed understanding and agreed to proceed.   History of Present Illness:   F/U chronic problems, medication review, and refill medication when necessary.Review of untreated depresssion also important Review most recent labs and order labs which are due Review preventive health and update with necessary referrals or immunizations as indicated Denies recent fever or chills. Denies sinus pressure, nasal congestion, ear pain or sore throat. Denies chest congestion, productive cough or wheezing. Denies chest pains, palpitations and leg swelling Denies abdominal pain, nausea, vomiting,diarrhea or constipation.   Denies dysuria, frequency, hesitancy or incontinence. Denies joint pain, swelling and limitation in mobility. Denies headaches, seizures, numbness, or tingling. Denies depression, anxiety or insomnia. Denies skin break down or rash.     Observations/Objective: BP 126/68   Pulse 63   Ht 5\' 2"  (1.575 m)   Wt 138 lb (62.6 kg)   BMI 25.24 kg/m  Good communication with no confusion and intact memory. Alert and oriented x 3 No signs of respiratory distress during speech    Assessment and Plan:  Essential hypertension Controlled, no change in medication DASH diet and commitment to daily physical activity for a minimum of 30 minutes discussed and encouraged, as a part of hypertension management. The importance of attaining a healthy weight is also  discussed.  BP/Weight 05/19/2019 03/26/2019 12/23/2018 09/15/2018 07/17/2018 04/08/2018 Q000111Q  Systolic BP 123XX123 A999333 0000000 0000000 123456 123456 [id number]  Diastolic BP 68 74 78 79 80 80 80  Wt. (Lbs) 138 128 141 138.8 133 133 143.12  BMI 25.24 23.41 25.38 24.98 23.94 23.94 25.76       Hyperlipidemia Hyperlipidemia:Low fat diet discussed and encouraged.   Lipid Panel  Lab Results  Component Value Date   CHOL 259 (H) 03/19/2019   HDL 81 03/19/2019   LDLCALC 161 (H) 03/19/2019   TRIG 73 03/19/2019   CHOLHDL 3.2 03/19/2019   Needs to reduce fat in diet, stating not being used , has liver  disease    Depression, major, single episode, severe (HCC) Marked improvement , essentially witrh resolution on current med, continue same  Overweight (BMI 25.0-29.9)  Patient re-educated about  the importance of commitment to a  minimum of 150 minutes of exercise per week as able.  The importance of healthy food choices with portion control discussed, as well as eating regularly and within a 12 hour window most days. The need to choose "clean , green" food 50 to 75% of the time is discussed, as well as to make water the primary drink and set a goal of 64 ounces water daily.    Weight /BMI 05/19/2019 03/26/2019 12/23/2018  WEIGHT 138 lb 128 lb 141 lb  HEIGHT 5\' 2"  5\' 2"  5' 2.5"  BMI 25.24 kg/m2 23.41 kg/m2 25.38 kg/m2       Follow Up Instructions:    I discussed the assessment and treatment plan with the patient. The patient was provided an opportunity to ask questions and all were answered. The patient agreed with the plan and  demonstrated an understanding of the instructions.   The patient was advised to call back or seek an in-person evaluation if the symptoms worsen or if the condition fails to improve as anticipated.  I provided  20 minutes of non-face-to-face time during this encounter.   Tula Nakayama, MD

## 2019-05-21 ENCOUNTER — Ambulatory Visit: Payer: Medicare Other | Admitting: Family Medicine

## 2019-06-01 ENCOUNTER — Ambulatory Visit: Payer: Medicare Other | Attending: Internal Medicine

## 2019-06-01 DIAGNOSIS — Z23 Encounter for immunization: Secondary | ICD-10-CM

## 2019-06-01 NOTE — Progress Notes (Signed)
   Covid-19 Vaccination Clinic  Name:  AIJALON GREENHILL    MRN: [id number] DOB: 1945-07-15  06/01/2019  Ms. Prothero was observed post Covid-19 immunization for 15 minutes without incident. She was provided with Vaccine Information Sheet and instruction to access the V-Safe system.   Ms. Kindler was instructed to call 911 with any severe reactions post vaccine: Marland Kitchen Difficulty breathing  . Swelling of face and throat  . A fast heartbeat  . A bad rash all over body  . Dizziness and weakness   Immunizations Administered    Name Date Dose VIS Date Route   Pfizer COVID-19 Vaccine 06/01/2019 10:05 AM 0.3 mL 02/27/2019 Intramuscular   Manufacturer: [place]   Lot: [id number]   [place]: [id number]

## 2019-07-21 ENCOUNTER — Encounter: Payer: Medicare Other | Admitting: Family Medicine

## 2019-07-26 NOTE — Patient Instructions (Addendum)
Christina Miles , Thank you for taking time to come for your Medicare Wellness Visit. I appreciate your ongoing commitment to your health goals. Please review the following plan we discussed and let me know if I can assist you in the future.   Screening recommendations/referrals: Colonoscopy: due 2024 Mammogram: up to date Bone Density: completed Recommended yearly ophthalmology/optometry visit for glaucoma screening and checkup Recommended yearly dental visit for hygiene and checkup  Vaccinations: Influenza vaccine: up to date Pneumococcal vaccine: completed Tdap vaccine: up to date Shingles vaccine: Check with pharmacy       Preventive Care 72 Years and Older, Female Preventive care refers to lifestyle choices and visits with your health care provider that can promote health and wellness. What does preventive care include?  A yearly physical exam. This is also called an annual well check.  Dental exams once or twice a year.  Routine eye exams. Ask your health care provider how often you should have your eyes checked.  Personal lifestyle choices, including:  Daily care of your teeth and gums.  Regular physical activity.  Eating a healthy diet.  Avoiding tobacco and drug use.  Limiting alcohol use.  Practicing safe sex.  Taking low-dose aspirin every day.  Taking vitamin and mineral supplements as recommended by your health care provider. What happens during an annual well check? The services and screenings done by your health care provider during your annual well check will depend on your age, overall health, lifestyle risk factors, and family history of disease. Counseling  Your health care provider may ask you questions about your:  Alcohol use.  Tobacco use.  Drug use.  Emotional well-being.  Home and relationship well-being.  Sexual activity.  Eating habits.  History of falls.  Memory and ability to understand (cognition).  Work and work  Statistician.  Reproductive health. Screening  You may have the following tests or measurements:  Height, weight, and BMI.  Blood pressure.  Lipid and cholesterol levels. These may be checked every 5 years, or more frequently if you are over 54 years old.  Skin check.  Lung cancer screening. You may have this screening every year starting at age 20 if you have a 30-pack-year history of smoking and currently smoke or have quit within the past 15 years.  Fecal occult blood test (FOBT) of the stool. You may have this test every year starting at age 54.  Flexible sigmoidoscopy or colonoscopy. You may have a sigmoidoscopy every 5 years or a colonoscopy every 10 years starting at age 28.  Hepatitis C blood test.  Hepatitis B blood test.  Sexually transmitted disease (STD) testing.  Diabetes screening. This is done by checking your blood sugar (glucose) after you have not eaten for a while (fasting). You may have this done every 1-3 years.  Bone density scan. This is done to screen for osteoporosis. You may have this done starting at age 33.  Mammogram. This may be done every 1-2 years. Talk to your health care provider about how often you should have regular mammograms. Talk with your health care provider about your test results, treatment options, and if necessary, the need for more tests. Vaccines  Your health care provider may recommend certain vaccines, such as:  Influenza vaccine. This is recommended every year.  Tetanus, diphtheria, and acellular pertussis (Tdap, Td) vaccine. You may need a Td booster every 10 years.  Zoster vaccine. You may need this after age 54.  Pneumococcal 13-valent conjugate (PCV13) vaccine. One dose  is recommended after age 1.  Pneumococcal polysaccharide (PPSV23) vaccine. One dose is recommended after age 61. Talk to your health care provider about which screenings and vaccines you need and how often you need them. This information is not  intended to replace advice given to you by your health care provider. Make sure you discuss any questions you have with your health care provider. Document Released: 04/01/2015 Document Revised: 11/23/2015 Document Reviewed: 01/04/2015 Elsevier Interactive Patient Education  2017 [place] Prevention in the Home Falls can cause injuries. They can happen to people of all ages. There are many things you can do to make your home safe and to help prevent falls. What can I do on the outside of my home?  Regularly fix the edges of walkways and driveways and fix any cracks.  Remove anything that might make you trip as you walk through a door, such as a raised step or threshold.  Trim any bushes or trees on the path to your home.  Use bright outdoor lighting.  Clear any walking paths of anything that might make someone trip, such as rocks or tools.  Regularly check to see if handrails are loose or broken. Make sure that both sides of any steps have handrails.  Any raised decks and porches should have guardrails on the edges.  Have any leaves, snow, or ice cleared regularly.  Use sand or salt on walking paths during winter.  Clean up any spills in your garage right away. This includes oil or grease spills. What can I do in the bathroom?  Use night lights.  Install grab bars by the toilet and in the tub and shower. Do not use towel bars as grab bars.  Use non-skid mats or decals in the tub or shower.  If you need to sit down in the shower, use a plastic, non-slip stool.  Keep the floor dry. Clean up any water that spills on the floor as soon as it happens.  Remove soap buildup in the tub or shower regularly.  Attach bath mats securely with double-sided non-slip rug tape.  Do not have throw rugs and other things on the floor that can make you trip. What can I do in the bedroom?  Use night lights.  Make sure that you have a light by your bed that is easy to reach.  Do  not use any sheets or blankets that are too big for your bed. They should not hang down onto the floor.  Have a firm chair that has side arms. You can use this for support while you get dressed.  Do not have throw rugs and other things on the floor that can make you trip. What can I do in the kitchen?  Clean up any spills right away.  Avoid walking on wet floors.  Keep items that you use a lot in easy-to-reach places.  If you need to reach something above you, use a strong step stool that has a grab bar.  Keep electrical cords out of the way.  Do not use floor polish or wax that makes floors slippery. If you must use wax, use non-skid floor wax.  Do not have throw rugs and other things on the floor that can make you trip. What can I do with my stairs?  Do not leave any items on the stairs.  Make sure that there are handrails on both sides of the stairs and use them. Fix handrails that are broken or loose.  Make sure that handrails are as long as the stairways.  Check any carpeting to make sure that it is firmly attached to the stairs. Fix any carpet that is loose or worn.  Avoid having throw rugs at the top or bottom of the stairs. If you do have throw rugs, attach them to the floor with carpet tape.  Make sure that you have a light switch at the top of the stairs and the bottom of the stairs. If you do not have them, ask someone to add them for you. What else can I do to help prevent falls?  Wear shoes that:  Do not have high heels.  Have rubber bottoms.  Are comfortable and fit you well.  Are closed at the toe. Do not wear sandals.  If you use a stepladder:  Make sure that it is fully opened. Do not climb a closed stepladder.  Make sure that both sides of the stepladder are locked into place.  Ask someone to hold it for you, if possible.  Clearly mark and make sure that you can see:  Any grab bars or handrails.  First and last steps.  Where the edge of each  step is.  Use tools that help you move around (mobility aids) if they are needed. These include:  Canes.  Walkers.  Scooters.  Crutches.  Turn on the lights when you go into a dark area. Replace any light bulbs as soon as they burn out.  Set up your furniture so you have a clear path. Avoid moving your furniture around.  If any of your floors are uneven, fix them.  If there are any pets around you, be aware of where they are.  Review your medicines with your doctor. Some medicines can make you feel dizzy. This can increase your chance of falling. Ask your doctor what other things that you can do to help prevent falls. This information is not intended to replace advice given to you by your health care provider. Make sure you discuss any questions you have with your health care provider. Document Released: 12/30/2008 Document Revised: 08/11/2015 Document Reviewed: 04/09/2014 Elsevier Interactive Patient Education  2017 [employer].

## 2019-07-27 ENCOUNTER — Other Ambulatory Visit: Payer: Self-pay

## 2019-07-27 ENCOUNTER — Telehealth (INDEPENDENT_AMBULATORY_CARE_PROVIDER_SITE_OTHER): Payer: Medicare Other

## 2019-07-27 VITALS — BP 126/68 | Ht 62.0 in | Wt 138.0 lb

## 2019-07-27 DIAGNOSIS — Z0000 Encounter for general adult medical examination without abnormal findings: Secondary | ICD-10-CM

## 2019-07-27 MED ORDER — AMLODIPINE BESYLATE 2.5 MG PO TABS: 2.5000 mg | ORAL_TABLET | Freq: Every day | ORAL | 1 refills | Status: DC

## 2019-07-27 MED ORDER — HYDROCHLOROTHIAZIDE 25 MG PO TABS: ORAL_TABLET | ORAL | 1 refills | Status: DC

## 2019-07-27 NOTE — Progress Notes (Addendum)
Subjective:   Christina Miles is a 74 y.o. female who presents for Medicare Annual (Subsequent) preventive examination.  Review of Systems:   Cardiac Risk Factors include: dyslipidemia;hypertension;smoking/ tobacco exposure     Objective:     Vitals: BP 126/68   Ht 5\' 2"  (1.575 m)   Wt 138 lb (62.6 kg)   BMI 25.24 kg/m   Body mass index is 25.24 kg/m.  Advanced Directives 10/24/2017 02/20/2012  Does Patient Have a Medical Advance Directive? No Patient does not have advance directive;Patient would like information  Would patient like information on creating a medical advance directive? Yes (MAU/Ambulatory/Procedural Areas - Information given) Advance directive packet given  Pre-existing out of facility DNR order (yellow form or pink MOST form) - No    Tobacco Social History   Tobacco Use  Smoking Status Former Smoker  . Packs/day: 1.00  . Years: 10.00  . Pack years: 10.00  . Types: Cigarettes  Smokeless Tobacco Never Used     Counseling given: Not Answered   Clinical Intake:  Pre-visit preparation completed: Yes  Pain : No/denies pain Pain Score: 0-No pain     Nutritional Status: BMI 25 -29 Overweight Diabetes: No  How often do you need to have someone help you when you read instructions, pamphlets, or other written materials from your doctor or pharmacy?: 1 - Never        Past Medical History:  Diagnosis Date  . Depression 2014  . Hepatitis   . Hyperlipidemia   . Hypertension   . Reduced vision 2015   right, had hemmorhage, treated by [name], injections   Past Surgical History:  Procedure Laterality Date  . ABDOMINAL HYSTERECTOMY  1985   . ? mass on ovary  . BACK SURGERY    . CHOLECYSTECTOMY    . COLONOSCOPY  02/20/2012   Procedure: COLONOSCOPY;  Surgeon: Rogene Houston, MD;  Location: [facility];  Service: Endoscopy;  Laterality: N/A;  930  . COLONOSCOPY     Patient states that this was 3-4 years ago @[facility]   . COLONOSCOPY N/A 10/24/2017     Procedure: COLONOSCOPY;  Surgeon: Rogene Houston, MD;  Location: [facility];  Service: Endoscopy;  Laterality: N/A;  1300  . GLAUCOMA SURGERY     05/2017  . POLYPECTOMY  10/24/2017   Procedure: POLYPECTOMY;  Surgeon: Rogene Houston, MD;  Location: [facility];  Service: Endoscopy;;  colon  . SPINE SURGERY  1998 approx   [place]   Family History  Problem Relation Age of Onset  . Colon cancer Father   . Dementia Mother   . Diabetes Brother   . Hypertension Brother   . Hypertension Brother    Social History   Socioeconomic History  . Marital status: Married    Spouse name: Not on file  . Number of children: Not on file  . Years of education: Not on file  . Highest education level: Not on file  Occupational History  . Not on file  Tobacco Use  . Smoking status: Former Smoker    Packs/day: 1.00    Years: 10.00    Pack years: 10.00    Types: Cigarettes  . Smokeless tobacco: Never Used  Substance and Sexual Activity  . Alcohol use: No    Alcohol/week: 0.0 standard drinks  . Drug use: No  . Sexual activity: Yes  Other Topics Concern  . Not on file  Social History Narrative  . Not on file  Social Determinants of Health   Financial Resource Strain:   . Difficulty of Paying Living Expenses:   Food Insecurity: No Food Insecurity  . Worried About Charity fundraiser in the Last Year: Never true  . Ran Out of Food in the Last Year: Never true  Transportation Needs: No Transportation Needs  . Lack of Transportation (Medical): No  . Lack of Transportation (Non-Medical): No  Physical Activity: Sufficiently Active  . Days of Exercise per Week: 5 days  . Minutes of Exercise per Session: 30 min  Stress: No Stress Concern Present  . Feeling of Stress : Not at all  Social Connections: Slightly Isolated  . Frequency of Communication with Friends and Family: Twice a week  . Frequency of Social Gatherings with Friends and Family: Once a week  . Attends Religious Services:  More than 4 times per year  . Active Member of Clubs or Organizations: No  . Attends Archivist Meetings: Never  . Marital Status: Married    Outpatient Encounter Medications as of 07/27/2019  Medication Sig  . amLODipine (NORVASC) 2.5 MG tablet Take 1 tablet (2.5 mg total) by mouth daily.  Marland Kitchen aspirin 81 MG tablet Take 1 tablet (81 mg total) by mouth daily.  . Calcium Carbonate-Vitamin D (CALTRATE 600+D) 600-400 MG-UNIT per tablet Take 2 tablets by mouth daily.  . fluticasone (FLONASE) 50 MCG/ACT nasal spray PLACE 2 SPRAYS INTO THE NOSE DAILY. (Patient taking differently: PLACE 2 SPRAYS INTO THE NOSE DAILY AS NEEDED FOR ALLERGIES)  . hydrochlorothiazide (HYDRODIURIL) 25 MG tablet TAKE (1) TABLET BY MOUTH ONCE DAILY.  Marland Kitchen latanoprost (XALATAN) 0.005 % ophthalmic solution Place 1 drop into the left eye at bedtime.   . mirtazapine (REMERON) 7.5 MG tablet Take 1 tablet (7.5 mg total) by mouth at bedtime.  . potassium chloride (KLOR-CON) 10 MEQ tablet Take 1 tablet (10 mEq total) by mouth 2 (two) times daily.  . timolol (TIMOPTIC) 0.5 % ophthalmic solution Place 1 drop into the left eye every morning.  . [DISCONTINUED] amLODipine (NORVASC) 2.5 MG tablet Take 1 tablet (2.5 mg total) by mouth daily.  . [DISCONTINUED] hydrochlorothiazide (HYDRODIURIL) 25 MG tablet TAKE (1) TABLET BY MOUTH ONCE DAILY.   No facility-administered encounter medications on file as of 07/27/2019.    Activities of Daily Living In your present state of health, do you have any difficulty performing the following activities: 07/27/2019  Hearing? N  Vision? N  Difficulty concentrating or making decisions? N  Walking or climbing stairs? N  Dressing or bathing? N  Doing errands, shopping? N  Preparing Food and eating ? N  Using the Toilet? N  In the past six months, have you accidently leaked urine? N  Do you have problems with loss of bowel control? N  Managing your Medications? N  Managing your Finances? N    Housekeeping or managing your Housekeeping? N  Some recent data might be hidden    Patient Care Team: Fayrene Helper, MD as PCP - General Hayden Pedro, MD as Consulting Physician (Ophthalmology)    Assessment:   This is a routine wellness examination for [place].  Exercise Activities and Dietary recommendations Current Exercise Habits: Home exercise routine, Type of exercise: walking, Time (Minutes): 30, Frequency (Times/Week): 5, Weekly Exercise (Minutes/Week): 150, Intensity: Mild, Exercise limited by: None identified  Goals    . DIET - INCREASE WATER INTAKE     Wants to drink more water     . LIFESTYLE - DECREASE  FALLS RISK       Fall Risk Fall Risk  07/27/2019 05/19/2019 03/26/2019 12/23/2018 07/17/2018  Falls in the past year? 0 0 0 0 0  Number falls in past yr: 0 0 0 0 -  Injury with Fall? 0 0 0 0 0   Is the patient's home free of loose throw rugs in walkways, pet beds, electrical cords, etc?   yes      Grab bars in the bathroom? yes      Handrails on the stairs?   yes      Adequate lighting?   yes  Timed Get Up and Go performed: not done, telephone visit   Depression Screen PHQ 2/9 Scores 07/27/2019 05/19/2019 03/26/2019 03/26/2019  PHQ - 2 Score 0 1 4 2   PHQ- 9 Score - 2 17 6      Cognitive Function     6CIT Screen 07/27/2019 07/17/2018  What Year? 0 points 0 points  What month? 0 points 0 points  What time? 0 points 0 points  Count back from 20 0 points 0 points  Months in reverse 0 points 0 points  Repeat phrase 0 points 0 points  Total Score 0 0    Immunization History  Administered Date(s) Administered  . Fluad Quad(high Dose 65+) 12/23/2018  . Influenza Split 01/23/2012  . Influenza Whole 12/18/2005  . Influenza,inj,Quad PF,6+ Mos 12/18/2012, 01/26/2014, 03/15/2015, 01/26/2016, 01/03/2017, 01/07/2018  . PFIZER SARS-COV-2 Vaccination 05/09/2019, 06/01/2019  . Pneumococcal Conjugate-13 10/28/2013  . Pneumococcal Polysaccharide-23 12/18/2012  . Td  08/09/2003  . Tdap 10/05/2014  . Zoster 01/23/2012    Qualifies for Shingles Vaccine? Qualifies   Screening Tests Health Maintenance  Topic Date Due  . INFLUENZA VACCINE  10/18/2019  . MAMMOGRAM  01/27/2021  . COLONOSCOPY  10/25/2022  . TETANUS/TDAP  10/04/2024  . DEXA SCAN  Completed  . COVID-19 Vaccine  Completed  . Hepatitis C Screening  Completed  . PNA vac Low Risk Adult  Completed    Cancer Screenings: Lung: Low Dose CT Chest recommended if Age 20-80 years, 30 pack-year currently smoking OR have quit w/in 15years. Patient does not qualify. Breast:  Up to date on Mammogram? Yes   Up to date of Bone Density/Dexa? Yes Colorectal: up to date until 2024  Additional Screenings:  Hepatitis C Screening: completed      Plan:     I have personally reviewed and noted the following in the patient's chart:   . Medical and social history . Use of alcohol, tobacco or illicit drugs  . Current medications and supplements . Functional ability and status . Nutritional status . Physical activity . Advanced directives . List of other physicians . Hospitalizations, surgeries, and ER visits in previous 12 months . Vitals . Screenings to include cognitive, depression, and falls . Referrals and appointments  In addition, I have reviewed and discussed with patient certain preventive protocols, quality metrics, and best practice recommendations. A written personalized care plan for preventive services as well as general preventive health recommendations were provided to patient.     Kate Sable, LPN, LPN  [id number]

## 2019-09-15 ENCOUNTER — Ambulatory Visit (INDEPENDENT_AMBULATORY_CARE_PROVIDER_SITE_OTHER): Payer: Medicare Other | Admitting: Internal Medicine

## 2019-09-18 ENCOUNTER — Encounter (INDEPENDENT_AMBULATORY_CARE_PROVIDER_SITE_OTHER): Payer: Medicare Other | Admitting: Ophthalmology

## 2019-09-18 ENCOUNTER — Other Ambulatory Visit: Payer: Self-pay

## 2019-09-18 DIAGNOSIS — H34831 Tributary (branch) retinal vein occlusion, right eye, with macular edema: Secondary | ICD-10-CM

## 2019-09-18 DIAGNOSIS — I10 Essential (primary) hypertension: Secondary | ICD-10-CM | POA: Diagnosis not present

## 2019-09-18 DIAGNOSIS — H43813 Vitreous degeneration, bilateral: Secondary | ICD-10-CM | POA: Diagnosis not present

## 2019-09-18 DIAGNOSIS — H35033 Hypertensive retinopathy, bilateral: Secondary | ICD-10-CM

## 2019-09-22 ENCOUNTER — Other Ambulatory Visit: Payer: Self-pay | Admitting: Family Medicine

## 2019-09-22 ENCOUNTER — Other Ambulatory Visit: Payer: Self-pay

## 2019-09-22 ENCOUNTER — Telehealth: Payer: Self-pay

## 2019-09-22 ENCOUNTER — Ambulatory Visit (INDEPENDENT_AMBULATORY_CARE_PROVIDER_SITE_OTHER): Payer: Medicare Other | Admitting: Gastroenterology

## 2019-09-22 ENCOUNTER — Encounter (INDEPENDENT_AMBULATORY_CARE_PROVIDER_SITE_OTHER): Payer: Self-pay | Admitting: Gastroenterology

## 2019-09-22 VITALS — BP 156/79 | Temp 98.7°F | Ht 62.5 in | Wt 135.9 lb

## 2019-09-22 DIAGNOSIS — Z800 Family history of malignant neoplasm of digestive organs: Secondary | ICD-10-CM | POA: Diagnosis not present

## 2019-09-22 DIAGNOSIS — I10 Essential (primary) hypertension: Secondary | ICD-10-CM

## 2019-09-22 DIAGNOSIS — E7849 Other hyperlipidemia: Secondary | ICD-10-CM

## 2019-09-22 DIAGNOSIS — B182 Chronic viral hepatitis C: Secondary | ICD-10-CM

## 2019-09-22 DIAGNOSIS — R772 Abnormality of alphafetoprotein: Secondary | ICD-10-CM

## 2019-09-22 MED ORDER — ERGOCALCIFEROL 1.25 MG (50000 UT) PO CAPS: 50000.0000 [IU] | ORAL_CAPSULE | ORAL | 1 refills | Status: DC

## 2019-09-22 NOTE — Patient Instructions (Signed)
We are checking labs today and contacting you with further recommendations.

## 2019-09-22 NOTE — Progress Notes (Signed)
Patient profile: Christina Miles is a 74 y.o. female seen for follow up last in our office in 08/2018. Follow up for HCV s/p treatent in 2017 with [facility].   History of Present Illness: Christina Miles is seen today for follow-up.  She reports doing very well since her visit last year.  She denies any GI symptoms of nausea, vomiting, epigastric pain, GERD. No dysphagia. Wt stable/.   She reports doing very well and denies any constipation, diarrhea, melena, rectal bleeding.  No lower abdominal pain.  No acute concerns today.  Wt Readings from Last 3 Encounters:  09/22/19 135 lb 14.4 oz (61.6 kg)  07/27/19 138 lb (62.6 kg)  05/19/19 138 lb (62.6 kg)     Last Colonoscopy: 2019-small polyp splenic flexure, distal sigmoid, external hemorrhoids. Path w/ one tubular adenoma.     Past Medical History:  Past Medical History:  Diagnosis Date  . Depression 2014  . Hepatitis   . Hyperlipidemia   . Hypertension   . Reduced vision 2015   right, had hemmorhage, treated by [name], injections    Problem List: Patient Active Problem List   Diagnosis Date Noted  . Overweight (BMI 25.0-29.9) 05/19/2019  . Depression, major, single episode, severe ([place]) 03/26/2019  . GAD (generalized anxiety disorder) 03/26/2019  . Neck pain on left side 03/26/2019  . Elevated AFP 12/24/2018  . Back pain with right-sided radiculopathy 12/23/2018  . History of colonic polyps 09/12/2017  . Family hx of colon cancer 09/12/2017  . Osteopenia 06/24/2013  . Insomnia 11/05/2012  . Seasonal allergies 11/05/2012  . Hyperlipidemia 08/21/2007  . Essential hypertension 08/21/2007    Past Surgical History: Past Surgical History:  Procedure Laterality Date  . ABDOMINAL HYSTERECTOMY  1985   . ? mass on ovary  . BACK SURGERY    . CHOLECYSTECTOMY    . COLONOSCOPY  02/20/2012   Procedure: COLONOSCOPY;  Surgeon: Rogene Houston, MD;  [facility];  Service: Endoscopy;  Laterality: N/A;  930  .  COLONOSCOPY     Patient states that this was 3-4 years ago @[facility]   . COLONOSCOPY N/A 10/24/2017   Procedure: COLONOSCOPY;  Surgeon: Rogene Houston, MD;  [facility]: [facility];  Service: Endoscopy;  Laterality: N/A;  1300  . GLAUCOMA SURGERY     05/2017  . POLYPECTOMY  10/24/2017   Procedure: POLYPECTOMY;  Surgeon: Rogene Houston, MD;  [facility]: [facility];  Service: Endoscopy;;  colon  . SPINE SURGERY  1998 approx   [place]    Allergies: Allergies  Allergen Reactions  . Ace Inhibitors Cough      Home Medications:  Current Outpatient Medications:  .  amLODipine (NORVASC) 2.5 MG tablet, Take 1 tablet (2.5 mg total) by mouth daily., Disp: 90 tablet, Rfl: 1 .  Calcium Carbonate-Vitamin D (CALTRATE 600+D) 600-400 MG-UNIT per tablet, Take 2 tablets by mouth daily., Disp: , Rfl:  .  hydrochlorothiazide (HYDRODIURIL) 25 MG tablet, TAKE (1) TABLET BY MOUTH ONCE DAILY., Disp: 90 tablet, Rfl: 1 .  latanoprost (XALATAN) 0.005 % ophthalmic solution, Place 1 drop into the left eye at bedtime. , Disp: , Rfl:  .  mirtazapine (REMERON) 7.5 MG tablet, Take 1 tablet (7.5 mg total) by mouth at bedtime., Disp: 30 tablet, Rfl: 5 .  potassium chloride (KLOR-CON) 10 MEQ tablet, Take 1 tablet (10 mEq total) by mouth 2 (two) times daily., Disp: 180 tablet, Rfl: 1 .  timolol (TIMOPTIC) 0.5 % ophthalmic solution, Place 1  drop into the left eye every morning., Disp: , Rfl:  .  aspirin 81 MG tablet, Take 1 tablet (81 mg total) by mouth daily. (Patient not taking: Reported on 09/22/2019), Disp: 30 tablet, Rfl:  .  fluticasone (FLONASE) 50 MCG/ACT nasal spray, PLACE 2 SPRAYS INTO THE NOSE DAILY. (Patient not taking: Reported on 09/22/2019), Disp: 16 g, Rfl: 5   Family History: family history includes Colon cancer in her father; Dementia in her mother; Diabetes in her brother; Hypertension in her brother and brother.    Social History:   reports that she has quit smoking. Her smoking use included cigarettes. She has  a 10.00 pack-year smoking history. She has never used smokeless tobacco. She reports that she does not drink alcohol and does not use drugs.   Review of Systems: Constitutional: Denies weight loss/weight gain  Eyes: No changes in vision. ENT: No oral lesions, sore throat.  GI: see HPI.  Heme/Lymph: No easy bruising.  CV: No chest pain.  GU: No hematuria.  Integumentary: No rashes.  Neuro: No headaches.  Psych: No depression/anxiety.  Endocrine: No heat/cold intolerance.  Allergic/Immunologic: No urticaria.  Resp: No cough, SOB.  Musculoskeletal: No joint swelling.    Physical Examination: BP (!) 156/79 (BP Location: Left Arm, Patient Position: Sitting, Cuff Size: Normal)   Temp 98.7 F (37.1 C) (Oral)   Ht 5' 2.5" (1.588 m)   Wt 135 lb 14.4 oz (61.6 kg)   BMI 24.46 kg/m  Gen: NAD, alert and oriented x 4 HEENT: PEERLA, EOMI, Neck: supple, no JVD Chest: CTA bilaterally, no wheezes, crackles, or other adventitious sounds CV: RRR, no m/g/c/r Abd: soft, NT, ND, +BS in all four quadrants; no HSM, guarding, ridigity, or rebound tenderness Ext: no edema, well perfused with 2+ pulses, Skin: no rash or lesions noted on observed skin Lymph: no noted LAD  Data Reviewed:  12/2018 - AFP elevated at 13.4 (down from 17 prior)  08/2018--CMP normal. CBC normal   08/2017--HCV RNA not detected.   RUQ [place] 09/2018-IMPRESSION: 1. Unremarkable liver.  No biliary dilatation. 2. Status post cholecystectomy  Assessment/Plan: Ms. Louk is a 74 y.o. female    [name] was seen today for 1 year follow up.  Diagnoses and all orders for this visit:  Hepatitis C virus carrier state ([place]) -     AFP tumor marker -     Hepatic function panel  Elevated AFP -     AFP tumor marker -     Hepatic function panel  Family history of colon cancer    1.  History of hepatitis C - genotype 2B, s/p treatment 2017, she did have an abnormal AFP elevation last year, will repeat her AFP today.  Was  recommended to have MRI abdominal imaging but insurance would not approve.  Further recommendations regarding imaging pending lab results.  She had SVR 2 years after diagnosis and will hold off on repeating HCV RNA. Repeat LFTS today.    2.  Family history of colon cancer-father-due for repeat 2024    I personally performed the service, non-incident to. (WP)  Laurine Blazer, [facility]

## 2019-09-22 NOTE — Telephone Encounter (Signed)
Patient aware of new prescription. Appointment scheduled and labs ordered.

## 2019-09-22 NOTE — Telephone Encounter (Signed)
Once weekly vitamin D is prescribed for the next 6 months.  Please let patient know.  Patient needs office visit scheduled in July or early August with MD.  She is to get fasting lipid CMP and EGFR prior to that visit.  If additional labs are needed I will send a second message.

## 2019-09-22 NOTE — Telephone Encounter (Signed)
Please advise 

## 2019-09-22 NOTE — Telephone Encounter (Signed)
Pt stopped by to see if VIT D could be called in Dr Otelia Limes office said it was low

## 2019-09-22 NOTE — Telephone Encounter (Signed)
Pls also order a CBC

## 2019-09-23 LAB — HEPATIC FUNCTION PANEL
AG Ratio: 1.6 (calc) (ref 1.0–2.5)
ALT: 11 U/L (ref 6–29)
AST: 16 U/L (ref 10–35)
Albumin: 4.5 g/dL (ref 3.6–5.1)
Alkaline phosphatase (APISO): 83 U/L (ref 37–153)
Bilirubin, Direct: 0.2 mg/dL (ref 0.0–0.2)
Globulin: 2.9 g/dL (calc) (ref 1.9–3.7)
Indirect Bilirubin: 0.9 mg/dL (calc) (ref 0.2–1.2)
Total Bilirubin: 1.1 mg/dL (ref 0.2–1.2)
Total Protein: 7.4 g/dL (ref 6.1–8.1)

## 2019-09-23 LAB — AFP TUMOR MARKER: AFP-Tumor Marker: 13.2 ng/mL — ABNORMAL HIGH

## 2019-09-30 ENCOUNTER — Other Ambulatory Visit (INDEPENDENT_AMBULATORY_CARE_PROVIDER_SITE_OTHER): Payer: Self-pay | Admitting: Gastroenterology

## 2019-09-30 DIAGNOSIS — R772 Abnormality of alphafetoprotein: Secondary | ICD-10-CM

## 2019-09-30 DIAGNOSIS — B182 Chronic viral hepatitis C: Secondary | ICD-10-CM

## 2019-09-30 NOTE — Progress Notes (Unsigned)
Repeat AFP order entered to be done in Jan 2022.

## 2019-09-30 NOTE — Progress Notes (Signed)
A reminder has been entered to release order in January 2022.

## 2019-10-05 ENCOUNTER — Other Ambulatory Visit: Payer: Self-pay

## 2019-10-05 DIAGNOSIS — I10 Essential (primary) hypertension: Secondary | ICD-10-CM

## 2019-10-05 DIAGNOSIS — E7849 Other hyperlipidemia: Secondary | ICD-10-CM

## 2019-10-06 ENCOUNTER — Encounter: Payer: Self-pay | Admitting: Family Medicine

## 2019-10-06 ENCOUNTER — Ambulatory Visit (INDEPENDENT_AMBULATORY_CARE_PROVIDER_SITE_OTHER): Payer: Medicare Other | Admitting: Family Medicine

## 2019-10-06 ENCOUNTER — Other Ambulatory Visit: Payer: Self-pay

## 2019-10-06 VITALS — BP 160/82 | HR 66 | Resp 16 | Ht 62.0 in | Wt 137.0 lb

## 2019-10-06 DIAGNOSIS — I10 Essential (primary) hypertension: Secondary | ICD-10-CM | POA: Diagnosis not present

## 2019-10-06 DIAGNOSIS — F322 Major depressive disorder, single episode, severe without psychotic features: Secondary | ICD-10-CM

## 2019-10-06 DIAGNOSIS — E663 Overweight: Secondary | ICD-10-CM | POA: Diagnosis not present

## 2019-10-06 DIAGNOSIS — E7849 Other hyperlipidemia: Secondary | ICD-10-CM

## 2019-10-06 DIAGNOSIS — M542 Cervicalgia: Secondary | ICD-10-CM

## 2019-10-06 LAB — CBC
Hematocrit: 41 % (ref 34.0–46.6)
Hemoglobin: 13.1 g/dL (ref 11.1–15.9)
MCH: 30.3 pg (ref 26.6–33.0)
MCHC: 32 g/dL (ref 31.5–35.7)
MCV: 95 fL (ref 79–97)
Platelets: 240 10*3/uL (ref 150–450)
RBC: 4.32 x10E6/uL (ref 3.77–5.28)
RDW: 12.4 % (ref 11.7–15.4)
WBC: 5.9 10*3/uL (ref 3.4–10.8)

## 2019-10-06 LAB — LIPID PANEL
Chol/HDL Ratio: 2.8 ratio (ref 0.0–4.4)
Cholesterol, Total: 223 mg/dL — ABNORMAL HIGH (ref 100–199)
HDL: 79 mg/dL (ref >39)
LDL Chol Calc (NIH): 129 mg/dL — ABNORMAL HIGH (ref 0–99)
Triglycerides: 89 mg/dL (ref 0–149)
VLDL Cholesterol Cal: 15 mg/dL (ref 5–40)

## 2019-10-06 LAB — CMP14+EGFR
ALT: 10 IU/L (ref 0–32)
AST: 12 IU/L (ref 0–40)
Albumin/Globulin Ratio: 1.5 (ref 1.2–2.2)
Albumin: 4.5 g/dL (ref 3.7–4.7)
Alkaline Phosphatase: 95 IU/L (ref 48–121)
BUN/Creatinine Ratio: 17 (ref 12–28)
BUN: 17 mg/dL (ref 8–27)
Bilirubin Total: 0.7 mg/dL (ref 0.0–1.2)
CO2: 27 mmol/L (ref 20–29)
Calcium: 9.9 mg/dL (ref 8.7–10.3)
Chloride: 102 mmol/L (ref 96–106)
Creatinine, Ser: 0.99 mg/dL (ref 0.57–1.00)
GFR calc Af Amer: 66 mL/min/{1.73_m2} (ref >59)
GFR calc non Af Amer: 58 mL/min/{1.73_m2} — ABNORMAL LOW (ref >59)
Globulin, Total: 3 g/dL (ref 1.5–4.5)
Glucose: 82 mg/dL (ref 65–99)
Potassium: 3.7 mmol/L (ref 3.5–5.2)
Sodium: 142 mmol/L (ref 134–144)
Total Protein: 7.5 g/dL (ref 6.0–8.5)

## 2019-10-06 MED ORDER — AMLODIPINE BESYLATE 5 MG PO TABS: 5.0000 mg | ORAL_TABLET | Freq: Every day | ORAL | 3 refills | Status: DC

## 2019-10-06 NOTE — Patient Instructions (Signed)
Wellness in office with MD in 6 to 8 week, call if you need  Me sooner ([employer])  Blood pressure is high, new higher dose of amlodipine is 5 mg one daily  Vit D to be added to recent lab  Please reduce fried and fatty foods so that your bad cholesterol improves, overall cholesterol is good  You are referred for PT once weekly for 6 weeks due to chronic neck pain   Increase fresh vegetable and fruit and reduce salt intake please  Thanks for choosing [facility], we consider it a privelige to serve you.

## 2019-10-06 NOTE — Progress Notes (Signed)
Christina Miles     MRN: [id number]      DOB: 1945-09-03   HPI Christina Miles is here for follow up and re-evaluation of chronic medical conditions, medication management and review of any available recent lab and radiology data.  Preventive health is updated, specifically  Cancer screening and Immunization.   Questions or concerns regarding consultations or procedures which the PT has had in the interim are  addressed. The PT denies any adverse reactions to current medications since the last visit.  Chronic neck pain increased    ROS Denies recent fever or chills. Denies sinus pressure, nasal congestion, ear pain or sore throat. Denies chest congestion, productive cough or wheezing. Denies chest pains, palpitations and leg swelling Denies abdominal pain, nausea, vomiting,diarrhea or constipation.   Denies dysuria, frequency, hesitancy or incontinence. Denies headaches, seizures, numbness, or tingling. Denies uncontrolled depression, anxiety or insomnia. Denies skin break down or rash.   PE  BP (!) 167/82   Pulse 66   Resp 16   Ht 5\' 2"  (1.575 m)   Wt 137 lb 0.6 oz (62.2 kg)   SpO2 100%   BMI 25.06 kg/m   Patient alert and oriented and in no cardiopulmonary distress.  HEENT: No facial asymmetry, EOMI,     Neck decreased ROM .  Chest: Clear to auscultation bilaterally.  CVS: S1, S2 no murmurs, no S3.Regular rate.  ABD: Soft non tender.   Ext: No edema  MS: Adequate ROM spine, shoulders, hips and knees.  Skin: Intact, no ulcerations or rash noted.  Psych: Good eye contact, normal affect. Memory intact not anxious or depressed appearing.  CNS: CN 2-12 intact, power,  normal throughout.no focal deficits noted.   Assessment & Plan  Essential hypertension Uncontrolled , med adjusted and return in 6 to 8 weeks DASH diet and commitment to daily physical activity for a minimum of 30 minutes discussed and encouraged, as a part of hypertension management. The  importance of attaining a healthy weight is also discussed.  BP/Weight 10/06/2019 09/22/2019 07/27/2019 05/19/2019 03/26/2019 12/23/2018 07/02/6061  Systolic BP 016 010 932 355 732 202 542  Diastolic BP 82 79 68 68 74 78 79  Wt. (Lbs) 137.04 135.9 138 138 128 141 138.8  BMI 25.06 24.46 25.24 25.24 23.41 25.38 24.98       Hyperlipidemia Hyperlipidemia:Low fat diet discussed and encouraged.   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  The importance of healthy food choices with portion control discussed, as well as eating regularly and within a 12 hour window most days. The need to choose "clean , green" food 50 to 75% of the time is discussed, as well as to make water the primary drink and set a goal of 64 ounces water daily.    Weight /BMI 10/06/2019 09/22/2019 07/27/2019  WEIGHT 137 lb 0.6 oz 135 lb 14.4 oz 138 lb  HEIGHT 5\' 2"  5' 2.5" 5\' 2"   BMI 25.06 kg/m2 24.46 kg/m2 25.24 kg/m2  Depression, major, single episode, severe ([facility]) Controlled, no change in medication

## 2019-10-07 ENCOUNTER — Ambulatory Visit: Payer: Medicare Other | Admitting: Family Medicine

## 2019-10-11 ENCOUNTER — Encounter: Payer: Self-pay | Admitting: Family Medicine

## 2019-10-11 NOTE — Assessment & Plan Note (Signed)
Uncontrolled , med adjusted and return in 6 to 8 weeks DASH diet and commitment to daily physical activity for a minimum of 30 minutes discussed and encouraged, as a part of hypertension management. The importance of attaining a healthy weight is also discussed.  BP/Weight 10/06/2019 09/22/2019 07/27/2019 05/19/2019 03/26/2019 12/23/2018 0/99/2780  Systolic BP 044 715 806 386 854 883 014  Diastolic BP 82 79 68 68 74 78 79  Wt. (Lbs) 137.04 135.9 138 138 128 141 138.8  BMI 25.06 24.46 25.24 25.24 23.41 25.38 24.98

## 2019-10-11 NOTE — Assessment & Plan Note (Signed)
Controlled, no change in medication  

## 2019-10-11 NOTE — Assessment & Plan Note (Signed)
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

## 2019-10-11 NOTE — Assessment & Plan Note (Signed)
Hyperlipidemia:Low fat diet discussed and encouraged.   Lipid Panel  Lab Results  Component Value Date   CHOL 223 (H) 10/05/2019   HDL 79 10/05/2019   LDLCALC 129 (H) 10/05/2019   TRIG 89 10/05/2019   CHOLHDL 2.8 10/05/2019   Needs to reduce fried and fatty foods

## 2019-10-11 NOTE — Assessment & Plan Note (Signed)
  Patient re-educated about  the importance of commitment to a  minimum of 150 minutes of exercise per week as able.  The importance of healthy food choices with portion control discussed, as well as eating regularly and within a 12 hour window most days. The need to choose "clean , green" food 50 to 75% of the time is discussed, as well as to make water the primary drink and set a goal of 64 ounces water daily.    Weight /BMI 10/06/2019 09/22/2019 07/27/2019  WEIGHT 137 lb 0.6 oz 135 lb 14.4 oz 138 lb  HEIGHT 5\' 2"  5' 2.5" 5\' 2"   BMI 25.06 kg/m2 24.46 kg/m2 25.24 kg/m2

## 2019-10-20 ENCOUNTER — Encounter (HOSPITAL_COMMUNITY): Payer: Self-pay | Admitting: Physical Therapy

## 2019-10-20 ENCOUNTER — Ambulatory Visit (HOSPITAL_COMMUNITY): Payer: Medicare Other | Attending: Family Medicine | Admitting: Physical Therapy

## 2019-10-20 ENCOUNTER — Other Ambulatory Visit: Payer: Self-pay

## 2019-10-20 DIAGNOSIS — R29898 Other symptoms and signs involving the musculoskeletal system: Secondary | ICD-10-CM

## 2019-10-20 DIAGNOSIS — M542 Cervicalgia: Secondary | ICD-10-CM | POA: Diagnosis present

## 2019-10-20 NOTE — Therapy (Signed)
[facility] [address] [place], [place], [address] Phone: [phone]   Fax:  [phone]  Physical Therapy Evaluation  Patient Details  Name: Christina Miles MRN: [id number] Date of Birth: 04/06/1945 Referring Provider (PT): Tula Nakayama MD   Encounter Date: 10/20/2019   PT End of Session - 10/20/19 1604    Visit Number 1    Number of Visits 8    Date for PT Re-Evaluation 11/17/19    Authorization Type Primary: BCBS Medicare Secondary [employer] (no auth no visit limit each)    Progress Note Due on Visit 10    PT Start Time 1520    PT Stop Time 1600    PT Time Calculation (min) 40 min    Activity Tolerance Patient tolerated treatment well    Behavior During Therapy [facility] for tasks assessed/performed           Past Medical History:  Diagnosis Date  . Depression 2014  . Hepatitis   . Hyperlipidemia   . Hypertension   . Reduced vision 2015   right, had hemmorhage, treated by [name], injections    Past Surgical History:  Procedure Laterality Date  . ABDOMINAL HYSTERECTOMY  1985   . ? mass on ovary  . BACK SURGERY    . CHOLECYSTECTOMY    . COLONOSCOPY  02/20/2012   Procedure: COLONOSCOPY;  Surgeon: Rogene Houston, MD;  [facility];  Service: Endoscopy;  Laterality: N/A;  930  . COLONOSCOPY     Patient states that this was 3-4 years ago @[facility]   . COLONOSCOPY N/A 10/24/2017   Procedure: COLONOSCOPY;  Surgeon: Rogene Houston, MD;  [facility];  Service: Endoscopy;  Laterality: N/A;  1300  . GLAUCOMA SURGERY     05/2017  . POLYPECTOMY  10/24/2017   Procedure: POLYPECTOMY;  Surgeon: Rogene Houston, MD;  [facility];  Service: Endoscopy;;  colon  . SPINE SURGERY  1998 approx   [place]    There were no vitals filed for this visit.    Subjective Assessment - 10/20/19 1520    Subjective Patient is a 74 y.o. female who presents to physical therapy with c/o L sided neck pain. Patient states  has had pain for several months. She has increased pain with lifting especially repetitive lifting up the back of her neck to the base of her skull. 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 No previous neck injury. Symptoms began with insidious onset. She also notes increased pain with sowing for extended periods of time such as about 4 hours. Heat pack makes it better. Patient states her main goal is to decrease her pain. Patient states pain has been every several days or every other day. She notes increased pain with bending while gardening and lifting buckets of veggies.    Limitations Other (comment);Lifting   sowing   Currently in Pain? No/denies    Pain Score --   worst 9/10             [name] PT Assessment - 10/20/19 0001      Assessment   Medical Diagnosis Neck pain on L side    Referring Provider (PT) Tula Nakayama MD    Onset Date/Surgical Date 06/20/19    Next MD Visit October    Prior Therapy Yes for brachial plexus injury      Precautions   Precautions None      Restrictions  Weight Bearing Restrictions No      Balance Screen   Has the patient fallen in the past 6 months No    Has the patient had a decrease in activity level because of a fear of falling?  No    Is the patient reluctant to leave their home because of a fear of falling?  No      Prior Function   Level of Independence Independent    Vocation Retired    Leisure Copy   Overall Cognitive Status Within Functional Limits for tasks assessed      Observation/Other Assessments   Observations Ambulates without AD    Focus on Therapeutic Outcomes (FOTO)  complete next session - website not working      Editor, commissioning Impaired Detail    Light Touch Impaired Details Impaired LUE    Additional Comments decreased C3 LUE, typically numb in ulnar nerve distribution due to prior injury      Posture/Postural Control   Posture Comments  slightly slouched and forward head      ROM / Strength   AROM / PROM / Strength AROM;Strength      AROM   Overall AROM Comments Pain in UT is somewhat concordant but pain typically more mindline    AROM Assessment Site Cervical    Cervical Flexion 25 % limited pain in L UT region    Cervical Extension 25% limited    Cervical - Right Side Bend 25% limited pulling on L    Cervical - Left Side Bend 25% limited    Cervical - Right Rotation 25% limited    Cervical - Left Rotation 25% limited pain      Strength   Strength Assessment Site Shoulder;Elbow;Wrist;Hand    Right/Left Shoulder Right;Left    Right Shoulder ABduction 5/5    Left Shoulder ABduction 5/5    Right/Left Elbow Right;Left    Right Elbow Flexion 5/5    Right Elbow Extension 5/5    Left Elbow Flexion 5/5    Left Elbow Extension 5/5    Right/Left Wrist Right;Left    Right Wrist Flexion 5/5    Right Wrist Extension 5/5    Left Wrist Flexion 5/5    Left Wrist Extension 5/5    Right/Left hand Right;Left    Right Hand Gross Grasp Functional    Left Hand Gross Grasp Functional      Palpation   Spinal mobility slightly hypmobile and tender c/sp    Palpation comment TTP and slightly hyperactive cervical paraspinals bilateral, UT on L, not tender bilateral Levator scapulae, rhomboids, R UT, scm, scalenes                      Objective measurements completed on examination: See above findings.       [facility] PT Treatment/Exercise - 10/20/19 0001      Exercises   Exercises Neck      Neck Exercises: Seated   Other Seated Exercise UT stretch in seated 5x 20 second holds L UT                  PT Education - 10/20/19 1528    Education Details Patient educated on exam findings, POC, scope of PT, posture    Person(s) Educated Patient    Methods Explanation;Demonstration    Comprehension Verbalized understanding;Returned demonstration            PT Short Term  Goals - 10/20/19 1610       PT SHORT TERM GOAL #1   Title Patient will be independent with HEP in order to improve functional outcomes.    Time 2    Period Weeks    Status New    Target Date 11/03/19      PT SHORT TERM GOAL #2   Title Patient will report at least 25% improvement in symptoms for improved quality of life.    Time 2    Period Weeks    Status New    Target Date 11/03/19             PT Long Term Goals - 10/20/19 1611      PT LONG TERM GOAL #1   Title Patient will report at least 75% improvement in symptoms for improved quality of life.      PT LONG TERM GOAL #2   Title Patient will demonstrate at least 25% improvement in cervical ROM in all planes for improved ability to move head while at home.    Time 4    Period Weeks    Status New    Target Date 11/17/19      PT LONG TERM GOAL #3   Title Patient will have neck pain no greater than 1/10 for at least 1 week for ability to sow and garden.    Time 4    Period Weeks    Status New    Target Date 11/17/19                  Plan - 10/20/19 1605    Clinical Impression Statement Patient is a 74 y.o. female who presents to physical therapy with c/o L sided neck pain. She presents with pain limited deficits in cervical spine strength, ROM, tender cervical musculature, endurance, postural impairments, spinal mobility and functional mobility with ADL. She is having to modify and restrict ADL as indicated by subjective information and objective measures which is affecting overall participation. Patient will benefit from skilled physical therapy in order to improve function and reduce impairment.    Personal Factors and Comorbidities Age;Time since onset of injury/illness/exacerbation;Profession    Examination-Activity Limitations Lift;Stand;Sit    Examination-Participation Restrictions [employer];Shop   gardening   Stability/Clinical Decision Making Stable/Uncomplicated    Clinical Decision Making Low    Rehab Potential Good     PT Frequency 2x / week    PT Duration 4 weeks    PT Treatment/Interventions ADLs/Self Care Home Management;Aquatic Therapy;Biofeedback;Canalith Repostioning;Cryotherapy;Electrical Stimulation;Iontophoresis 4mg /ml Dexamethasone;Moist Heat;Traction;Ultrasound;DME Instruction;Gait training;Stair training;Functional mobility training;Therapeutic activities;Therapeutic exercise;Balance training;Neuromuscular re-education;Patient/family education;Orthotic Fit/Training;Manual techniques;Manual lymph drainage;Compression bandaging;Passive range of motion;Dry needling;Energy conservation;Splinting;Taping;Spinal Manipulations;Joint Manipulations    PT Next Visit Plan possibly begin manual for pain/mobility of c/sp, begin postural strengthing and add therabands as able    PT Home Exercise Plan 8/3 UT stretch    Consulted and Agree with Plan of Care Patient           Patient will benefit from skilled therapeutic intervention in order to improve the following deficits and impairments:  Decreased range of motion, Hypomobility, Impaired flexibility, Increased muscle spasms, Pain, Postural dysfunction, Decreased strength, Decreased mobility  Visit Diagnosis: Cervicalgia  Other symptoms and signs involving the musculoskeletal system     Problem List Patient Active Problem List   Diagnosis Date Noted  . Overweight (BMI 25.0-29.9) 05/19/2019  . Depression, major, single episode, severe ([place]) 03/26/2019  . GAD (generalized anxiety disorder) 03/26/2019  . Neck pain  on left side 03/26/2019  . Elevated AFP 12/24/2018  . Back pain with right-sided radiculopathy 12/23/2018  . History of colonic polyps 09/12/2017  . Family hx of colon cancer 09/12/2017  . Osteopenia 06/24/2013  . Insomnia 11/05/2012  . Seasonal allergies 11/05/2012  . Hyperlipidemia 08/21/2007  . Essential hypertension 08/21/2007    4:14 PM, 10/20/19 Mearl Latin PT, DPT Physical Therapist at [facility] [place], [place], [address] Phone: [phone]   Fax:  [phone]  Name: RENISE GILLIES MRN: [id number] Date of Birth: [birth year]

## 2019-10-22 ENCOUNTER — Encounter (HOSPITAL_COMMUNITY): Payer: Self-pay | Admitting: Physical Therapy

## 2019-10-22 ENCOUNTER — Other Ambulatory Visit: Payer: Self-pay

## 2019-10-22 ENCOUNTER — Ambulatory Visit (HOSPITAL_COMMUNITY): Payer: Medicare Other | Admitting: Physical Therapy

## 2019-10-22 DIAGNOSIS — M542 Cervicalgia: Secondary | ICD-10-CM

## 2019-10-22 DIAGNOSIS — R29898 Other symptoms and signs involving the musculoskeletal system: Secondary | ICD-10-CM

## 2019-10-22 NOTE — Patient Instructions (Signed)
Access Code: [email or web address] URL: [email or web address] Date: 10/22/2019 Prepared by: Mitzi Hansen Kiosha Buchan  Exercises Seated Scapular Retraction - 1 x daily - 7 x weekly - 15 reps Supine Chin Tuck - 1 x daily - 7 x weekly - 2 sets - 10 reps - 2-3 second hold

## 2019-10-22 NOTE — Therapy (Signed)
[facility] [address] [place], [place], [address] Phone: [phone]   Fax:  [phone]  Physical Therapy Treatment  Patient Details  Name: Christina Miles MRN: [id number] Date of Birth: October 29, 1945 Referring Provider (PT): Tula Nakayama MD   Encounter Date: 10/22/2019   PT End of Session - 10/22/19 1435    Visit Number 2    Number of Visits 8    Date for PT Re-Evaluation 11/17/19    Authorization Type Primary: BCBS Medicare Secondary [employer]AM[employer]A (no auth no visit limit each)    Progress Note Due on Visit 10    PT Start Time 1435    PT Stop Time 1515    PT Time Calculation (min) 40 min    Activity Tolerance Patient tolerated treatment well    Behavior During Therapy [facility] for tasks assessed/performed           Past Medical History:  Diagnosis Date  . Depression 2014  . Hepatitis   . Hyperlipidemia   . Hypertension   . Reduced vision 2015   right, had hemmorhage, treated by [name], injections    Past Surgical History:  Procedure Laterality Date  . ABDOMINAL HYSTERECTOMY  1985   . ? mass on ovary  . BACK SURGERY    . CHOLECYSTECTOMY    . COLONOSCOPY  02/20/2012   Procedure: COLONOSCOPY;  Surgeon: Rogene Houston, MD;  [facility];  Service: Endoscopy;  Laterality: N/A;  930  . COLONOSCOPY     Patient states that this was 3-4 years ago @[facility]   . COLONOSCOPY N/A 10/24/2017   Procedure: COLONOSCOPY;  Surgeon: Rogene Houston, MD;  [facility];  Service: Endoscopy;  Laterality: N/A;  1300  . GLAUCOMA SURGERY     05/2017  . POLYPECTOMY  10/24/2017   Procedure: POLYPECTOMY;  Surgeon: Rogene Houston, MD;  [facility];  Service: Endoscopy;;  colon  . SPINE SURGERY  1998 approx   [place]    There were no vitals filed for this visit.   Subjective Assessment - 10/22/19 1435    Subjective Patient states she was sore after last session. She is having some pain in the muscle on the left side of  her neck.    Limitations Other (comment);Lifting   sowing   Currently in Pain? No/denies    Pain Score 3               [facility] PT Assessment - 10/22/19 0001      Observation/Other Assessments   Focus on Therapeutic Outcomes (FOTO)  34% limited                         [facility] Adult PT Treatment/Exercise - 10/22/19 0001      Neck Exercises: Theraband   Rows 10 reps;Green    Rows Limitations 2 sets      Neck Exercises: Seated   Shoulder Rolls 20 reps    Other Seated Exercise UT stretch in seated 5x 20 second holds L UT    Other Seated Exercise t/sp extension over chair 10x 5 second holds      Neck Exercises: Supine   Neck Retraction 10 reps;3 secs    Neck Retraction Limitations 2 sets      Manual Therapy   Manual Therapy Joint mobilization;Soft tissue mobilization    Manual therapy comments completed independently from all other apsects of treatment    Joint Mobilization L  UPA grade II-III C3-C6    Soft tissue mobilization STM to L UT, levator scapulae, cervical paraspinals                  PT Education - 10/22/19 1435    Education Details Patient educated on HEP, mechanics of exercise    Person(s) Educated Patient    Methods Explanation;Demonstration    Comprehension Verbalized understanding;Returned demonstration            PT Short Term Goals - 10/22/19 1509      PT SHORT TERM GOAL #1   Title Patient will be independent with HEP in order to improve functional outcomes.    Time 2    Period Weeks    Status On-going    Target Date 11/03/19      PT SHORT TERM GOAL #2   Title Patient will report at least 25% improvement in symptoms for improved quality of life.    Time 2    Period Weeks    Status On-going    Target Date 11/03/19             PT Long Term Goals - 10/22/19 1509      PT LONG TERM GOAL #1   Title Patient will report at least 75% improvement in symptoms for improved quality of life.    Time 4    Period Weeks     Status On-going      PT LONG TERM GOAL #2   Title Patient will demonstrate at least 25% improvement in cervical ROM in all planes for improved ability to move head while at home.    Time 4    Period Weeks    Status On-going      PT LONG TERM GOAL #3   Title Patient will have neck pain no greater than 1/10 for at least 1 week for ability to sow and garden.    Time 4    Period Weeks    Status On-going                 Plan - 10/22/19 1435    Clinical Impression Statement Completed FOTO today with patient and she is 34% limited currently with function. Patient requires verbal and tactile cueing for mechanics of cervical retraction in supine. Patient tolerates manual therapy well with greatest hypomobility and tenderness at C4-C6 on L which improve with repetition of mobilization. She is tender near levator scapulae insertion on scapula which is where she typically states pain. Patient states improvement in symptoms following manual therapy. Patient able to complete scapular retractions with good mechanics in seated. She is able to complete prior HEP exercise without cueing. Patient will continue to benefit from skilled physical therapy in order to reduce impairment and improve function.    Personal Factors and Comorbidities Age;Time since onset of injury/illness/exacerbation;Profession    Examination-Activity Limitations Lift;Stand;Sit    Examination-Participation Restrictions [employer];Shop   gardening   Stability/Clinical Decision Making Stable/Uncomplicated    Rehab Potential Good    PT Frequency 2x / week    PT Duration 4 weeks    PT Treatment/Interventions ADLs/Self Care Home Management;Aquatic Therapy;Biofeedback;Canalith Repostioning;Cryotherapy;Electrical Stimulation;Iontophoresis 4mg /ml Dexamethasone;Moist Heat;Traction;Ultrasound;DME Instruction;Gait training;Stair training;Functional mobility training;Therapeutic activities;Therapeutic exercise;Balance  training;Neuromuscular re-education;Patient/family education;Orthotic Fit/Training;Manual techniques;Manual lymph drainage;Compression bandaging;Passive range of motion;Dry needling;Energy conservation;Splinting;Taping;Spinal Manipulations;Joint Manipulations    PT Next Visit Plan possibly continue manual for pain/mobility of c/sp, continue postural strengthing and add therabands as able    PT Home Exercise Plan 8/3 UT  stretch 8/5 c/sp retractions in supine, scap retractions    Consulted and Agree with Plan of Care Patient           Patient will benefit from skilled therapeutic intervention in order to improve the following deficits and impairments:  Decreased range of motion, Hypomobility, Impaired flexibility, Increased muscle spasms, Pain, Postural dysfunction, Decreased strength, Decreased mobility  Visit Diagnosis: Cervicalgia  Other symptoms and signs involving the musculoskeletal system     Problem List Patient Active Problem List   Diagnosis Date Noted  . Overweight (BMI 25.0-29.9) 05/19/2019  . Depression, major, single episode, severe ([place]) 03/26/2019  . GAD (generalized anxiety disorder) 03/26/2019  . Neck pain on left side 03/26/2019  . Elevated AFP 12/24/2018  . Back pain with right-sided radiculopathy 12/23/2018  . History of colonic polyps 09/12/2017  . Family hx of colon cancer 09/12/2017  . Osteopenia 06/24/2013  . Insomnia 11/05/2012  . Seasonal allergies 11/05/2012  . Hyperlipidemia 08/21/2007  . Essential hypertension 08/21/2007    3:14 PM, 10/22/19 Mearl Latin PT, DPT Physical Therapist at [facility] [place], [place], [address] Phone: [phone]   Fax:  [phone]  Name: SARA KEYS MRN: [id number] Date of Birth: [birth year]

## 2019-10-27 ENCOUNTER — Ambulatory Visit (HOSPITAL_COMMUNITY): Payer: Medicare Other | Admitting: Physical Therapy

## 2019-10-27 ENCOUNTER — Other Ambulatory Visit: Payer: Self-pay

## 2019-10-27 ENCOUNTER — Encounter (HOSPITAL_COMMUNITY): Payer: Self-pay | Admitting: Physical Therapy

## 2019-10-27 DIAGNOSIS — R29898 Other symptoms and signs involving the musculoskeletal system: Secondary | ICD-10-CM

## 2019-10-27 DIAGNOSIS — M542 Cervicalgia: Secondary | ICD-10-CM

## 2019-10-27 NOTE — Therapy (Signed)
[facility] [address] [place], [place], [address] Phone: [phone]   Fax:  [phone]  Physical Therapy Treatment  Patient Details  Name: Christina Miles MRN: [id number] Date of Birth: March 12, 1946 Referring Provider (PT): Tula Nakayama MD   Encounter Date: 10/27/2019   PT End of Session - 10/27/19 1520    Visit Number 3    Number of Visits 8    Date for PT Re-Evaluation 11/17/19    Authorization Type Primary: BCBS Medicare Secondary CHAMPVA (no auth no visit limit each)    Progress Note Due on Visit 10    PT Start Time 1520    PT Stop Time 1600    PT Time Calculation (min) 40 min    Activity Tolerance Patient tolerated treatment well    Behavior During Therapy [facility] for tasks assessed/performed           Past Medical History:  Diagnosis Date  . Depression 2014  . Hepatitis   . Hyperlipidemia   . Hypertension   . Reduced vision 2015   right, had hemmorhage, treated by [name], injections    Past Surgical History:  Procedure Laterality Date  . ABDOMINAL HYSTERECTOMY  1985   . ? mass on ovary  . BACK SURGERY    . CHOLECYSTECTOMY    . COLONOSCOPY  02/20/2012   Procedure: COLONOSCOPY;  Surgeon: Rogene Houston, MD;  [facility];  Service: Endoscopy;  Laterality: N/A;  930  . COLONOSCOPY     Patient states that this was 3-4 years ago @[facility]   . COLONOSCOPY N/A 10/24/2017   Procedure: COLONOSCOPY;  Surgeon: Rogene Houston, MD;  [facility];  Service: Endoscopy;  Laterality: N/A;  1300  . GLAUCOMA SURGERY     05/2017  . POLYPECTOMY  10/24/2017   Procedure: POLYPECTOMY;  Surgeon: Rogene Houston, MD;  [facility];  Service: Endoscopy;;  colon  . SPINE SURGERY  1998 approx   [place]    There were no vitals filed for this visit.   Subjective Assessment - 10/27/19 1519    Subjective Patient states neck has been doing good. Her neck has been feeling better and thinks that what we've doing  is helpful. She continues to get achy after being in certain positions for periods of time. Patient states 50% improvement in symptoms.    Limitations Other (comment);Lifting   sowing   Currently in Pain? No/denies                             [facility] Adult PT Treatment/Exercise - 10/27/19 0001      Neck Exercises: Theraband   Shoulder Extension 15 reps;Green    Shoulder Extension Limitations 2 sets    Rows 15 reps;Green    Rows Limitations 2 sets    Other Theraband Exercises scapular retraction and depression with glenohumeral ER green band 2x10 with posterior support at wall for posture      Neck Exercises: Seated   Neck Retraction 10 reps;3 secs    Neck Retraction Limitations 2 sets    Other Seated Exercise UT stretch in seated 5x 20 second holds L UT    Other Seated Exercise t/sp extension over chair 10x 5 second holds      Neck Exercises: Sidelying   Other Sidelying Exercise open book trunk rotation 10x 5 second holds bilateral  PT Education - 10/27/19 1520    Education Details Educated on HEP, Office manager of exercise    Person(s) Educated Patient    Methods Explanation;Demonstration    Comprehension Verbalized understanding;Returned demonstration            PT Short Term Goals - 10/22/19 1509      PT SHORT TERM GOAL #1   Title Patient will be independent with HEP in order to improve functional outcomes.    Time 2    Period Weeks    Status On-going    Target Date 11/03/19      PT SHORT TERM GOAL #2   Title Patient will report at least 25% improvement in symptoms for improved quality of life.    Time 2    Period Weeks    Status On-going    Target Date 11/03/19             PT Long Term Goals - 10/22/19 1509      PT LONG TERM GOAL #1   Title Patient will report at least 75% improvement in symptoms for improved quality of life.    Time 4    Period Weeks    Status On-going      PT LONG TERM GOAL #2   Title Patient  will demonstrate at least 25% improvement in cervical ROM in all planes for improved ability to move head while at home.    Time 4    Period Weeks    Status On-going      PT LONG TERM GOAL #3   Title Patient will have neck pain no greater than 1/10 for at least 1 week for ability to sow and garden.    Time 4    Period Weeks    Status On-going                 Plan - 10/27/19 1520    Clinical Impression Statement Patient demonstrates good mechanics with cervical retraction in seated today. Patient educated on good posture while completing. She demonstrates improving strength and tolerates increased reps of resisted row exercise. She fatigues quickly with shoulder extension exercise for improving posture. Today's session focused on postural strengthen and spinal mobility. She is able to complete resisted scapular retraction and depression with glenohumeral ER with proper mechanics following demonstration. Patient will continue to benefit from physical therapy in order to reduce impairment and improve function.    Personal Factors and Comorbidities Age;Time since onset of injury/illness/exacerbation;Profession    Examination-Activity Limitations Lift;Stand;Sit    Examination-Participation Restrictions [employer];Shop   gardening   Stability/Clinical Decision Making Stable/Uncomplicated    Rehab Potential Good    PT Frequency 2x / week    PT Duration 4 weeks    PT Treatment/Interventions ADLs/Self Care Home Management;Aquatic Therapy;Biofeedback;Canalith Repostioning;Cryotherapy;Electrical Stimulation;Iontophoresis 4mg /ml Dexamethasone;Moist Heat;Traction;Ultrasound;DME Instruction;Gait training;Stair training;Functional mobility training;Therapeutic activities;Therapeutic exercise;Balance training;Neuromuscular re-education;Patient/family education;Orthotic Fit/Training;Manual techniques;Manual lymph drainage;Compression bandaging;Passive range of motion;Dry needling;Energy  conservation;Splinting;Taping;Spinal Manipulations;Joint Manipulations    PT Next Visit Plan possibly continue manual for pain/mobility of c/sp, continue postural strengthing and add theraband resistance as able    PT Home Exercise Plan 8/3 UT stretch 8/5 c/sp retractions in supine, scap retractions 8/10 c/sp retractions in seated, open book, row, ext    Consulted and Agree with Plan of Care Patient           Patient will benefit from skilled therapeutic intervention in order to improve the following deficits and impairments:  Decreased range of motion, Hypomobility, Impaired  flexibility, Increased muscle spasms, Pain, Postural dysfunction, Decreased strength, Decreased mobility  Visit Diagnosis: Cervicalgia  Other symptoms and signs involving the musculoskeletal system     Problem List Patient Active Problem List   Diagnosis Date Noted  . Overweight (BMI 25.0-29.9) 05/19/2019  . Depression, major, single episode, severe ([place]) 03/26/2019  . GAD (generalized anxiety disorder) 03/26/2019  . Neck pain on left side 03/26/2019  . Elevated AFP 12/24/2018  . Back pain with right-sided radiculopathy 12/23/2018  . History of colonic polyps 09/12/2017  . Family hx of colon cancer 09/12/2017  . Osteopenia 06/24/2013  . Insomnia 11/05/2012  . Seasonal allergies 11/05/2012  . Hyperlipidemia 08/21/2007  . Essential hypertension 08/21/2007    4:01 PM, 10/27/19 Mearl Latin PT, DPT Physical Therapist at [facility], [place], [address] Phone: [phone]   Fax:  [phone]  Name: NATACHA JEPSEN MRN: [id number] Date of Birth: [birth year]

## 2019-10-27 NOTE — Patient Instructions (Signed)
Access Code: [place] URL: [email or web address] Date: 10/27/2019 Prepared by: Mitzi Hansen Dan Dissinger  Exercises Seated Cervical Retraction - 1 x daily - 7 x weekly - 2 sets - 10 reps - 2-3 second hold Side Lying Open Book - 1 x daily - 7 x weekly - 1 sets - 10 reps - 5 second holds hold Standing Shoulder Row with Anchored Resistance - 1 x daily - 7 x weekly - 2 sets - 15 reps Shoulder extension with resistance - Neutral - 1 x daily - 7 x weekly - 2 sets - 10 reps

## 2019-10-29 ENCOUNTER — Ambulatory Visit (HOSPITAL_COMMUNITY): Payer: Medicare Other | Admitting: Physical Therapy

## 2019-10-29 ENCOUNTER — Other Ambulatory Visit: Payer: Self-pay

## 2019-10-29 ENCOUNTER — Encounter (HOSPITAL_COMMUNITY): Payer: Self-pay | Admitting: Physical Therapy

## 2019-10-29 DIAGNOSIS — M542 Cervicalgia: Secondary | ICD-10-CM

## 2019-10-29 DIAGNOSIS — R29898 Other symptoms and signs involving the musculoskeletal system: Secondary | ICD-10-CM

## 2019-10-29 LAB — VITAMIN D 25 HYDROXY (VIT D DEFICIENCY, FRACTURES): Vit D, 25-Hydroxy: 25.8 ng/mL — ABNORMAL LOW (ref 30.0–100.0)

## 2019-10-29 LAB — SPECIMEN STATUS REPORT

## 2019-10-29 NOTE — Therapy (Signed)
[facility] [address] [place], [place], [address] Phone: [phone]   Fax:  [phone]  Physical Therapy Treatment  Patient Details  Name: Christina Miles MRN: [id number] Date of Birth: Dec 26, 1945 Referring Provider (PT): Tula Nakayama MD   Encounter Date: 10/29/2019   PT End of Session - 10/29/19 1304    Visit Number 4    Number of Visits 8    Date for PT Re-Evaluation 11/17/19    Authorization Type Primary: BCBS Medicare Secondary CHAMPVA (no auth no visit limit each)    Progress Note Due on Visit 10    PT Start Time 1300    PT Stop Time 1338    PT Time Calculation (min) 38 min    Activity Tolerance Patient tolerated treatment well    Behavior During Therapy [facility] for tasks assessed/performed           Past Medical History:  Diagnosis Date  . Depression 2014  . Hepatitis   . Hyperlipidemia   . Hypertension   . Reduced vision 2015   right, had hemmorhage, treated by [name], injections    Past Surgical History:  Procedure Laterality Date  . ABDOMINAL HYSTERECTOMY  1985   . ? mass on ovary  . BACK SURGERY    . CHOLECYSTECTOMY    . COLONOSCOPY  02/20/2012   Procedure: COLONOSCOPY;  Surgeon: Rogene Houston, MD;  [facility];  Service: Endoscopy;  Laterality: N/A;  930  . COLONOSCOPY     Patient states that this was 3-4 years ago @[facility]   . COLONOSCOPY N/A 10/24/2017   Procedure: COLONOSCOPY;  Surgeon: Rogene Houston, MD;  [facility];  Service: Endoscopy;  Laterality: N/A;  1300  . GLAUCOMA SURGERY     05/2017  . POLYPECTOMY  10/24/2017   Procedure: POLYPECTOMY;  Surgeon: Rogene Houston, MD;  [facility];  Service: Endoscopy;;  colon  . SPINE SURGERY  1998 approx   [place]    There were no vitals filed for this visit.   Subjective Assessment - 10/29/19 1301    Subjective Patient reported a 1/10 pain currently.    Limitations Other (comment);Lifting   sowing   Currently in  Pain? Yes    Pain Score 1     Pain Location Neck    Pain Orientation Left    Pain Descriptors / Indicators Aching                             [facility] Adult PT Treatment/Exercise - 10/29/19 0001      Neck Exercises: Theraband   Shoulder Extension 15 reps;Green    Shoulder Extension Limitations 2 sets    Rows 15 reps;Green    Rows Limitations 2 sets    Other Theraband Exercises scapular retraction and depression with glenohumeral ER green band 2x10       Neck Exercises: Standing   Other Standing Exercises Y Liftoff x10       Neck Exercises: Seated   Neck Retraction 10 reps;3 secs    Neck Retraction Limitations 2 sets    Other Seated Exercise Thoracic rotation with foam roll between knees and PVC pipe behind neck rotating left and right 2x10. UT stretch in seated 5x 20 second holds L UT    Other Seated Exercise t/sp extension over chair 10x 5 second holds  PT Short Term Goals - 10/22/19 1509      PT SHORT TERM GOAL #1   Title Patient will be independent with HEP in order to improve functional outcomes.    Time 2    Period Weeks    Status On-going    Target Date 11/03/19      PT SHORT TERM GOAL #2   Title Patient will report at least 25% improvement in symptoms for improved quality of life.    Time 2    Period Weeks    Status On-going    Target Date 11/03/19             PT Long Term Goals - 10/22/19 1509      PT LONG TERM GOAL #1   Title Patient will report at least 75% improvement in symptoms for improved quality of life.    Time 4    Period Weeks    Status On-going      PT LONG TERM GOAL #2   Title Patient will demonstrate at least 25% improvement in cervical ROM in all planes for improved ability to move head while at home.    Time 4    Period Weeks    Status On-going      PT LONG TERM GOAL #3   Title Patient will have neck pain no greater than 1/10 for at least 1 week for ability to sow and garden.    Time 4     Period Weeks    Status On-going                 Plan - 10/29/19 1351    Clinical Impression Statement Continued with established POC this session. Focused on postural strengthening. Added Y liftoff exercise this session. Also added seated rotation with PVC pipe. Patient demonstrated good form with this with minimal cueing. Plan to continue progressing patient as able in upcoming sessions.    Personal Factors and Comorbidities Age;Time since onset of injury/illness/exacerbation;Profession    Examination-Activity Limitations Lift;Stand;Sit    Examination-Participation Restrictions [employer];Shop   gardening   Stability/Clinical Decision Making Stable/Uncomplicated    Rehab Potential Good    PT Frequency 2x / week    PT Duration 4 weeks    PT Treatment/Interventions ADLs/Self Care Home Management;Aquatic Therapy;Biofeedback;Canalith Repostioning;Cryotherapy;Electrical Stimulation;Iontophoresis 4mg /ml Dexamethasone;Moist Heat;Traction;Ultrasound;DME Instruction;Gait training;Stair training;Functional mobility training;Therapeutic activities;Therapeutic exercise;Balance training;Neuromuscular re-education;Patient/family education;Orthotic Fit/Training;Manual techniques;Manual lymph drainage;Compression bandaging;Passive range of motion;Dry needling;Energy conservation;Splinting;Taping;Spinal Manipulations;Joint Manipulations    PT Next Visit Plan Trial prone strengthening exercises. possibly continue manual for pain/mobility of c/sp, continue postural strengthing.    PT Home Exercise Plan 8/3 UT stretch 8/5 c/sp retractions in supine, scap retractions 8/10 c/sp retractions in seated, open book, row, ext    Consulted and Agree with Plan of Care Patient           Patient will benefit from skilled therapeutic intervention in order to improve the following deficits and impairments:  Decreased range of motion, Hypomobility, Impaired flexibility, Increased muscle spasms, Pain,  Postural dysfunction, Decreased strength, Decreased mobility  Visit Diagnosis: Cervicalgia  Other symptoms and signs involving the musculoskeletal system     Problem List Patient Active Problem List   Diagnosis Date Noted  . Overweight (BMI 25.0-29.9) 05/19/2019  . Depression, major, single episode, severe ([place]) 03/26/2019  . GAD (generalized anxiety disorder) 03/26/2019  . Neck pain on left side 03/26/2019  . Elevated AFP 12/24/2018  . Back pain with right-sided radiculopathy 12/23/2018  . History of colonic  polyps 09/12/2017  . Family hx of colon cancer 09/12/2017  . Osteopenia 06/24/2013  . Insomnia 11/05/2012  . Seasonal allergies 11/05/2012  . Hyperlipidemia 08/21/2007  . Essential hypertension 08/21/2007   Clarene Critchley PT, DPT 1:54 PM, 10/29/19 [facility] [place], [place], [address] Phone: [phone]   Fax:  [phone]  Name: MELVENIA FAVELA MRN: [id number] Date of Birth: 1945/11/22

## 2019-11-05 ENCOUNTER — Encounter (HOSPITAL_COMMUNITY): Payer: Medicare Other | Admitting: Physical Therapy

## 2019-11-06 ENCOUNTER — Encounter (INDEPENDENT_AMBULATORY_CARE_PROVIDER_SITE_OTHER): Payer: Medicare Other | Admitting: Ophthalmology

## 2019-11-06 ENCOUNTER — Other Ambulatory Visit: Payer: Self-pay

## 2019-11-06 DIAGNOSIS — I10 Essential (primary) hypertension: Secondary | ICD-10-CM | POA: Diagnosis not present

## 2019-11-06 DIAGNOSIS — H35033 Hypertensive retinopathy, bilateral: Secondary | ICD-10-CM | POA: Diagnosis not present

## 2019-11-06 DIAGNOSIS — H2513 Age-related nuclear cataract, bilateral: Secondary | ICD-10-CM

## 2019-11-06 DIAGNOSIS — H43813 Vitreous degeneration, bilateral: Secondary | ICD-10-CM

## 2019-11-06 DIAGNOSIS — H34831 Tributary (branch) retinal vein occlusion, right eye, with macular edema: Secondary | ICD-10-CM | POA: Diagnosis not present

## 2019-11-10 ENCOUNTER — Encounter (HOSPITAL_COMMUNITY): Payer: Self-pay | Admitting: Physical Therapy

## 2019-11-10 ENCOUNTER — Other Ambulatory Visit: Payer: Self-pay

## 2019-11-10 ENCOUNTER — Ambulatory Visit (HOSPITAL_COMMUNITY): Payer: Medicare Other | Admitting: Physical Therapy

## 2019-11-10 DIAGNOSIS — R29898 Other symptoms and signs involving the musculoskeletal system: Secondary | ICD-10-CM

## 2019-11-10 DIAGNOSIS — M542 Cervicalgia: Secondary | ICD-10-CM

## 2019-11-10 NOTE — Therapy (Signed)
[facility] [address] [place], [place], [address] Phone: [phone]   Fax:  [phone]  Physical Therapy Treatment/Discharge Summary  Patient Details  Name: Christina Miles MRN: [id number] Date of Birth: June 24, 1945 Referring Provider (PT): Tula Nakayama MD   Encounter Date: 11/10/2019   PHYSICAL THERAPY DISCHARGE SUMMARY  Visits from Start of Care: 5  Current functional level related to goals / functional outcomes: See below   Remaining deficits: See below   Education / Equipment: See below  Plan: Patient agrees to discharge.  Patient goals were met. Patient is being discharged due to meeting the stated rehab goals.  ?????        PT End of Session - 11/10/19 1346    Visit Number 5    Number of Visits 8    Date for PT Re-Evaluation 11/17/19    Authorization Type Primary: [employer] Medicare Secondary [employer] (no auth no visit limit each)    Progress Note Due on Visit 10    PT Start Time 1346    PT Stop Time 1410    PT Time Calculation (min) 24 min    Activity Tolerance Patient tolerated treatment well    Behavior During Therapy WFL for tasks assessed/performed           Past Medical History:  Diagnosis Date  . Depression 2014  . Hepatitis   . Hyperlipidemia   . Hypertension   . Reduced vision 2015   right, had hemmorhage, treated by [name], injections    Past Surgical History:  Procedure Laterality Date  . ABDOMINAL HYSTERECTOMY  1985   . ? mass on ovary  . BACK SURGERY    . CHOLECYSTECTOMY    . COLONOSCOPY  02/20/2012   Procedure: COLONOSCOPY;  Surgeon: Rogene Houston, MD;  [facility];  Service: Endoscopy;  Laterality: N/A;  930  . COLONOSCOPY     Patient states that this was 3-4 years ago _0   . COLONOSCOPY N/A 10/24/2017   Procedure: COLONOSCOPY;  Surgeon: Rogene Houston, MD;  Location: [facility];  Service: Endoscopy;  Laterality: N/A;  1300  . GLAUCOMA SURGERY     05/2017  .  POLYPECTOMY  10/24/2017   Procedure: POLYPECTOMY;  Surgeon: Rogene Houston, MD;  [facility]: [facility];  Service: Endoscopy;;  colon  . SPINE SURGERY  1998 approx   [place]    There were no vitals filed for this visit.   Subjective Assessment - 11/10/19 1346    Subjective Patient states she has been doing good and she went on a trip. Patient states 95% improvement with physical therapy intervention. She feels that she is now able to manage her symptoms independently. She has been able to do her exercises and they seem to help. Patient states symptoms at worst have been a 2/10.    Limitations Other (comment);Lifting   sowing   Currently in Pain? No/denies              [facility] PT Assessment - 11/10/19 0001      Assessment   Medical Diagnosis Neck pain on L side    Referring Provider (PT) Tula Nakayama MD    Onset Date/Surgical Date 06/20/19    Prior Therapy Yes for brachial plexus injury      Precautions   Precautions None      Restrictions   Weight Bearing Restrictions No      Balance Screen   Has the patient fallen in  the past 6 months No    Has the patient had a decrease in activity level because of a fear of falling?  No    Is the patient reluctant to leave their home because of a fear of falling?  No      Prior Function   Level of Independence Independent    Vocation Retired    Leisure Copy   Overall Cognitive Status Within Functional Limits for tasks assessed      Observation/Other Assessments   Observations Ambulates without AD    Focus on Therapeutic Outcomes (FOTO)  22% limited      Posture/Postural Control   Posture Comments slightly slouched and forward head      AROM   Cervical Flexion 0% limited    Cervical Extension 0% limited    Cervical - Right Side Bend 0% limited    Cervical - Left Side Bend 0% limited    Cervical - Right Rotation 0% limited    Cervical - Left Rotation 0% limited                                  PT Education - 11/10/19 1346    Education Details Educated on HEP, mechanics of exercise, returning to physical therapy if needed, posture and positioning.    Person(s) Educated Patient    Methods Explanation;Demonstration    Comprehension Verbalized understanding;Returned demonstration            PT Short Term Goals - 11/10/19 1353      PT SHORT TERM GOAL #1   Title Patient will be independent with HEP in order to improve functional outcomes.    Time 2    Period Weeks    Status Achieved    Target Date 11/03/19      PT SHORT TERM GOAL #2   Title Patient will report at least 25% improvement in symptoms for improved quality of life.    Time 2    Period Weeks    Status Achieved    Target Date 11/03/19             PT Long Term Goals - 11/10/19 1353      PT LONG TERM GOAL #1   Title Patient will report at least 75% improvement in symptoms for improved quality of life.    Time 4    Period Weeks    Status Achieved      PT LONG TERM GOAL #2   Title Patient will demonstrate at least 25% improvement in cervical ROM in all planes for improved ability to move head while at home.    Time 4    Period Weeks    Status Achieved      PT LONG TERM GOAL #3   Title Patient will have neck pain no greater than 1/10 for at least 1 week for ability to sow and garden.    Time 4    Period Weeks    Status Partially Met                 Plan - 11/10/19 1347    Clinical Impression Statement Patient as met 2/2 short term goals with ability to complete HEP and improvement in symptoms. She has met 2/3 long term goals with improved symptoms and ROM. Remaining goal partially met to symptoms reaching 2/10 over last week which patient was able to manage  independently. Patient feels that she is adequately prepared to manage symptoms with HEP and is ready for discharge. Patient continues to have minimal symptoms when in sustained positions which  she can decrease with focus on posture or performing exercise. Patient educated on return to PT if necessary. Patient discharged from PT at this time.    Personal Factors and Comorbidities Age;Time since onset of injury/illness/exacerbation;Profession    Examination-Activity Limitations Lift;Stand;Sit    Examination-Participation Restrictions [employer];Shop   gardening   Stability/Clinical Decision Making Stable/Uncomplicated    Rehab Potential Good    PT Frequency --    PT Duration --    PT Treatment/Interventions ADLs/Self Care Home Management;Aquatic Therapy;Biofeedback;Canalith Repostioning;Cryotherapy;Electrical Stimulation;Iontophoresis 74m/ml Dexamethasone;Moist Heat;Traction;Ultrasound;DME Instruction;Gait training;Stair training;Functional mobility training;Therapeutic activities;Therapeutic exercise;Balance training;Neuromuscular re-education;Patient/family education;Orthotic Fit/Training;Manual techniques;Manual lymph drainage;Compression bandaging;Passive range of motion;Dry needling;Energy conservation;Splinting;Taping;Spinal Manipulations;Joint Manipulations    PT Next Visit Plan n/a    PT Home Exercise Plan 8/3 UT stretch 8/5 c/sp retractions in supine, scap retractions 8/10 c/sp retractions in seated, open book, row, ext    Consulted and Agree with Plan of Care Patient           Patient will benefit from skilled therapeutic intervention in order to improve the following deficits and impairments:  Decreased range of motion, Hypomobility, Impaired flexibility, Increased muscle spasms, Pain, Postural dysfunction, Decreased strength, Decreased mobility  Visit Diagnosis: Cervicalgia  Other symptoms and signs involving the musculoskeletal system     Problem List Patient Active Problem List   Diagnosis Date Noted  . Overweight (BMI 25.0-29.9) 05/19/2019  . Depression, major, single episode, severe ([place] 03/26/2019  . GAD (generalized anxiety disorder) 03/26/2019  .  Neck pain on left side 03/26/2019  . Elevated AFP 12/24/2018  . Back pain with right-sided radiculopathy 12/23/2018  . History of colonic polyps 09/12/2017  . Family hx of colon cancer 09/12/2017  . Osteopenia 06/24/2013  . Insomnia 11/05/2012  . Seasonal allergies 11/05/2012  . Hyperlipidemia 08/21/2007  . Essential hypertension 08/21/2007    2:23 PM, 11/10/19 AMearl LatinPT, DPT [email or web address] [id number]: [phone]  Fax:  [phone] Name: Christina DROEGEMRN: [id number] of Birth: 909/27/1947

## 2019-11-12 ENCOUNTER — Encounter (HOSPITAL_COMMUNITY): Payer: Medicare Other | Admitting: Physical Therapy

## 2019-11-17 ENCOUNTER — Encounter: Payer: Medicare Other | Admitting: Family Medicine

## 2019-11-17 ENCOUNTER — Encounter (HOSPITAL_COMMUNITY): Payer: Medicare Other | Admitting: Physical Therapy

## 2019-12-17 ENCOUNTER — Encounter (INDEPENDENT_AMBULATORY_CARE_PROVIDER_SITE_OTHER): Payer: Medicare Other | Admitting: Ophthalmology

## 2019-12-28 ENCOUNTER — Encounter (INDEPENDENT_AMBULATORY_CARE_PROVIDER_SITE_OTHER): Payer: Medicare Other | Admitting: Ophthalmology

## 2019-12-28 ENCOUNTER — Other Ambulatory Visit: Payer: Self-pay

## 2019-12-28 DIAGNOSIS — I10 Essential (primary) hypertension: Secondary | ICD-10-CM

## 2019-12-28 DIAGNOSIS — H35033 Hypertensive retinopathy, bilateral: Secondary | ICD-10-CM | POA: Diagnosis not present

## 2019-12-28 DIAGNOSIS — H34831 Tributary (branch) retinal vein occlusion, right eye, with macular edema: Secondary | ICD-10-CM | POA: Diagnosis not present

## 2019-12-28 DIAGNOSIS — H43813 Vitreous degeneration, bilateral: Secondary | ICD-10-CM | POA: Diagnosis not present

## 2020-01-04 ENCOUNTER — Other Ambulatory Visit: Payer: Self-pay

## 2020-01-04 ENCOUNTER — Ambulatory Visit (INDEPENDENT_AMBULATORY_CARE_PROVIDER_SITE_OTHER): Payer: Medicare Other | Admitting: Family Medicine

## 2020-01-04 ENCOUNTER — Encounter: Payer: Self-pay | Admitting: Family Medicine

## 2020-01-04 VITALS — BP 165/89 | Resp 16 | Ht 62.0 in | Wt 133.0 lb

## 2020-01-04 DIAGNOSIS — I10 Essential (primary) hypertension: Secondary | ICD-10-CM

## 2020-01-04 DIAGNOSIS — Z0001 Encounter for general adult medical examination with abnormal findings: Secondary | ICD-10-CM

## 2020-01-04 DIAGNOSIS — R350 Frequency of micturition: Secondary | ICD-10-CM

## 2020-01-04 DIAGNOSIS — R9431 Abnormal electrocardiogram [ECG] [EKG]: Secondary | ICD-10-CM

## 2020-01-04 DIAGNOSIS — R002 Palpitations: Secondary | ICD-10-CM | POA: Diagnosis not present

## 2020-01-04 DIAGNOSIS — E559 Vitamin D deficiency, unspecified: Secondary | ICD-10-CM

## 2020-01-04 DIAGNOSIS — Z23 Encounter for immunization: Secondary | ICD-10-CM | POA: Diagnosis not present

## 2020-01-04 DIAGNOSIS — Z1231 Encounter for screening mammogram for malignant neoplasm of breast: Secondary | ICD-10-CM

## 2020-01-04 DIAGNOSIS — E7849 Other hyperlipidemia: Secondary | ICD-10-CM

## 2020-01-04 DIAGNOSIS — Z780 Asymptomatic menopausal state: Secondary | ICD-10-CM

## 2020-01-04 LAB — POCT URINALYSIS DIP (CLINITEK)
Bilirubin, UA: NEGATIVE
Blood, UA: NEGATIVE
Glucose, UA: NEGATIVE mg/dL
Ketones, POC UA: NEGATIVE mg/dL
Leukocytes, UA: NEGATIVE
Nitrite, UA: NEGATIVE
POC PROTEIN,UA: NEGATIVE
Spec Grav, UA: 1.02 (ref 1.010–1.025)
Urobilinogen, UA: 0.2 E.U./dL
pH, UA: 7.5 (ref 5.0–8.0)

## 2020-01-04 NOTE — Patient Instructions (Addendum)
F/U 2nd week in November  in office with mD, re evaluate blood pressure  Flu vaccine today  Please schedule mammogram and bone density at checkout  EKG in office today, due to uncontrolled blood pressure and recent palpitations  CCUA in office  Today, you report frequency  Need to take your amlodipine once every day as well as the HCTZ and potASSIUM FOR YOUR BLOOD PRESSURE  Take once weekly vit D for 12 weeks total, then  Just your calcium with D two daily  Please get fasting lipid, cmp and eGFR  3 to 5 days k before November follow up  Thanks for choosing [facility], we consider it a privelige to serve you.  You are being referred to Cardiology and Pulmonary Doctors    Managing Your Hypertension Hypertension is commonly called high blood pressure. This is when the force of your blood pressing against the walls of your arteries is too strong. Arteries are blood vessels that carry blood from your heart throughout your body. Hypertension forces the heart to work harder to pump blood, and may cause the arteries to become narrow or stiff. Having untreated or uncontrolled hypertension can cause heart attack, stroke, kidney disease, and other problems. What are blood pressure readings? A blood pressure reading consists of a higher number over a lower number. Ideally, your blood pressure should be below 120/80. The first ("top") number is called the systolic pressure. It is a measure of the pressure in your arteries as your heart beats. The second ("bottom") number is called the diastolic pressure. It is a measure of the pressure in your arteries as the heart relaxes. What does my blood pressure reading mean? Blood pressure is classified into four stages. Based on your blood pressure reading, your health care provider may use the following stages to determine what type of treatment you need, if any. Systolic pressure and diastolic pressure are measured in a unit called mm  Hg. Normal  Systolic pressure: below 761.  Diastolic pressure: below 80. Elevated  Systolic pressure: 607-371.  Diastolic pressure: below 80. Hypertension stage 1  Systolic pressure: 062-694.  Diastolic pressure: 85-46. Hypertension stage 2  Systolic pressure: 270 or above.  Diastolic pressure: 90 or above. What health risks are associated with hypertension? Managing your hypertension is an important responsibility. Uncontrolled hypertension can lead to:  A heart attack.  A stroke.  A weakened blood vessel (aneurysm).  Heart failure.  Kidney damage.  Eye damage.  Metabolic syndrome.  Memory and concentration problems. What changes can I make to manage my hypertension? Hypertension can be managed by making lifestyle changes and possibly by taking medicines. Your health care provider will help you make a plan to bring your blood pressure within a normal range. Eating and drinking   Eat a diet that is high in fiber and potassium, and low in salt (sodium), added sugar, and fat. An example eating plan is called the DASH (Dietary Approaches to Stop Hypertension) diet. To eat this way: ? Eat plenty of fresh fruits and vegetables. Try to fill half of your plate at each meal with fruits and vegetables. ? Eat whole grains, such as whole wheat pasta, brown rice, or whole grain bread. Fill about one quarter of your plate with whole grains. ? Eat low-fat diary products. ? Avoid fatty cuts of meat, processed or cured meats, and poultry with skin. Fill about one quarter of your plate with lean proteins such as fish, chicken without skin, beans, eggs, and tofu. ?  Avoid premade and processed foods. These tend to be higher in sodium, added sugar, and fat.  Reduce your daily sodium intake. Most people with hypertension should eat less than 1,500 mg of sodium a day.  Limit alcohol intake to no more than 1 drink a day for nonpregnant women and 2 drinks a day for men. One drink equals  12 oz of beer, 5 oz of wine, or 1 oz of hard liquor. Lifestyle  Work with your health care provider to maintain a healthy body weight, or to lose weight. Ask what an ideal weight is for you.  Get at least 30 minutes of exercise that causes your heart to beat faster (aerobic exercise) most days of the week. Activities may include walking, swimming, or biking.  Include exercise to strengthen your muscles (resistance exercise), such as weight lifting, as part of your weekly exercise routine. Try to do these types of exercises for 30 minutes at least 3 days a week.  Do not use any products that contain nicotine or tobacco, such as cigarettes and e-cigarettes. If you need help quitting, ask your health care provider.  Control any long-term (chronic) conditions you have, such as high cholesterol or diabetes. Monitoring  Monitor your blood pressure at home as told by your health care provider. Your personal target blood pressure may vary depending on your medical conditions, your age, and other factors.  Have your blood pressure checked regularly, as often as told by your health care provider. Working with your health care provider  Review all the medicines you take with your health care provider because there may be side effects or interactions.  Talk with your health care provider about your diet, exercise habits, and other lifestyle factors that may be contributing to hypertension.  Visit your health care provider regularly. Your health care provider can help you create and adjust your plan for managing hypertension. Will I need medicine to control my blood pressure? Your health care provider may prescribe medicine if lifestyle changes are not enough to get your blood pressure under control, and if:  Your systolic blood pressure is 130 or higher.  Your diastolic blood pressure is 80 or higher. Take medicines only as told by your health care provider. Follow the directions carefully. Blood  pressure medicines must be taken as prescribed. The medicine does not work as well when you skip doses. Skipping doses also puts you at risk for problems. Contact a health care provider if:  You think you are having a reaction to medicines you have taken.  You have repeated (recurrent) headaches.  You feel dizzy.  You have swelling in your ankles.  You have trouble with your vision. Get help right away if:  You develop a severe headache or confusion.  You have unusual weakness or numbness, or you feel faint.  You have severe pain in your chest or abdomen.  You vomit repeatedly.  You have trouble breathing. Summary  Hypertension is when the force of blood pumping through your arteries is too strong. If this condition is not controlled, it may put you at risk for serious complications.  Your personal target blood pressure may vary depending on your medical conditions, your age, and other factors. For most people, a normal blood pressure is less than 120/80.  Hypertension is managed by lifestyle changes, medicines, or both. Lifestyle changes include weight loss, eating a healthy, low-sodium diet, exercising more, and limiting alcohol. This information is not intended to replace advice given to you by  your health care provider. Make sure you discuss any questions you have with your health care provider. Document Revised: 06/27/2018 Document Reviewed: 02/01/2016 Elsevier Patient Education  2020 [place] are referred to lung specialist and cardiologist

## 2020-01-04 NOTE — Progress Notes (Signed)
    Christina Miles     MRN: [id number]      DOB: [birth year]  HPI: Patient is in for annual physical exam. C/o light headedness, palpitations and urinary frequency Recent labs,  are reviewed. Immunization is reviewed , and  updated   PE: Resp 16   Ht 5\' 2"  (1.575 m)   Wt 133 lb (60.3 kg)   SpO2 99%   BMI 24.33 kg/m      Pleasant  female, alert and oriented x 3, in no cardio-pulmonary distress. Afebrile. HEENT No facial trauma or asymetry. Sinuses non tender.  Extra occullar muscles intact.. External ears normal, . Neck: supple, no adenopathy,JVD or thyromegaly.No bruits.  Chest: Clear to ascultation bilaterally.No crackles or wheezes. Non tender to palpation  Breast: No asymetry,no masses or lumps. No tenderness. No nipple discharge or inversion. No axillary or supraclavicular adenopathy  Cardiovascular system; Heart sounds normal,  S1 and  S2 ,no S3.  No murmur, or thrill. Apical beat not displaced Peripheral pulses normal. EKG:Bi atrial enlargement , NSR, no ischemia , no LVH Abdomen: Soft, non tender, no organomegaly or masses. No bruits. Bowel sounds normal. No guarding, tenderness or rebound.    Musculoskeletal exam: Full ROM of spine, hips , shoulders and knees. No deformity ,swelling or crepitus noted. No muscle wasting or atrophy.   Neurologic: Cranial nerves 2 to 12 intact. Power, tone ,sensation and reflexes normal throughout. No disturbance in gait. No tremor.  Skin: Intact, no ulceration, erythema , scaling or rash noted. Pigmentation normal throughout  Psych; Normal mood and affect. Judgement and concentration normal   Assessment & Plan:  Encounter for annual general medical examination with abnormal findings in adult Annual exam as documented. Counseling done  re healthy lifestyle involving commitment to 150 minutes exercise per week, heart healthy diet, and attaining healthy weight.The importance of adequate sleep also  discussed. Regular seat belt use and home safety, is also discussed. Changes in health habits are decided on by the patient with goals and time frames  set for achieving them. Immunization and cancer screening needs are specifically addressed at this visit.   Essential hypertension Uncontrolled, medications are reviewed with the patient DASH diet and commitment to daily physical activity for a minimum of 30 minutes discussed and encouraged, as a part of hypertension management. The importance of attaining a healthy weight is also discussed.  BP/Weight 01/04/2020 10/06/2019 09/22/2019 07/27/2019 05/19/2019 03/26/2019 40/11/8117  Systolic BP - 147 829 562 130 865 784  Diastolic BP - 82 79 68 68 74 78  Wt. (Lbs) 133 137.04 135.9 138 138 128 141  BMI 24.33 25.06 24.46 25.24 25.24 23.41 25.38       Intermittent palpitations Reports new onset palpitations with light headedness,  EKG: NSR, bi atrial enlargement , no LVH, no ischemia Refer to Cardiology  Urinary frequency CCUA totally normal which is reassuring  Vitamin D deficiency Reviewed report and specifics given in terms of supplementation

## 2020-01-08 ENCOUNTER — Other Ambulatory Visit: Payer: Self-pay | Admitting: Family Medicine

## 2020-01-10 ENCOUNTER — Encounter: Payer: Self-pay | Admitting: Family Medicine

## 2020-01-10 DIAGNOSIS — R350 Frequency of micturition: Secondary | ICD-10-CM | POA: Insufficient documentation

## 2020-01-10 DIAGNOSIS — E559 Vitamin D deficiency, unspecified: Secondary | ICD-10-CM | POA: Insufficient documentation

## 2020-01-10 DIAGNOSIS — R002 Palpitations: Secondary | ICD-10-CM | POA: Insufficient documentation

## 2020-01-10 DIAGNOSIS — Z0001 Encounter for general adult medical examination with abnormal findings: Secondary | ICD-10-CM | POA: Insufficient documentation

## 2020-01-10 NOTE — Assessment & Plan Note (Signed)
Reports new onset palpitations with light headedness,  EKG: NSR, bi atrial enlargement , no LVH, no ischemia Refer to Cardiology

## 2020-01-10 NOTE — Assessment & Plan Note (Signed)
Reviewed report and specifics given in terms of supplementation

## 2020-01-10 NOTE — Assessment & Plan Note (Signed)
Uncontrolled, medications are reviewed with the patient DASH diet and commitment to daily physical activity for a minimum of 30 minutes discussed and encouraged, as a part of hypertension management. The importance of attaining a healthy weight is also discussed.  BP/Weight 01/04/2020 10/06/2019 09/22/2019 07/27/2019 05/19/2019 03/26/2019 79/09/2818  Systolic BP - 601 561 537 943 276 147  Diastolic BP - 82 79 68 68 74 78  Wt. (Lbs) 133 137.04 135.9 138 138 128 141  BMI 24.33 25.06 24.46 25.24 25.24 23.41 25.38

## 2020-01-10 NOTE — Assessment & Plan Note (Signed)
CCUA totally normal which is reassuring

## 2020-01-10 NOTE — Assessment & Plan Note (Signed)

## 2020-01-15 LAB — COMPLETE METABOLIC PANEL WITH GFR
AG Ratio: 1.3 (calc) (ref 1.0–2.5)
ALT: 11 U/L (ref 6–29)
AST: 15 U/L (ref 10–35)
Albumin: 4.2 g/dL (ref 3.6–5.1)
Alkaline phosphatase (APISO): 85 U/L (ref 37–153)
BUN/Creatinine Ratio: 19 (calc) (ref 6–22)
BUN: 20 mg/dL (ref 7–25)
CO2: 31 mmol/L (ref 20–32)
Calcium: 9.9 mg/dL (ref 8.6–10.4)
Chloride: 101 mmol/L (ref 98–110)
Creat: 1.05 mg/dL — ABNORMAL HIGH (ref 0.60–0.93)
GFR, Est African American: 61 mL/min/{1.73_m2} (ref >=60)
GFR, Est Non African American: 53 mL/min/{1.73_m2} — ABNORMAL LOW (ref >=60)
Globulin: 3.2 g/dL (calc) (ref 1.9–3.7)
Glucose, Bld: 97 mg/dL (ref 65–99)
Potassium: 4.4 mmol/L (ref 3.5–5.3)
Sodium: 138 mmol/L (ref 135–146)
Total Bilirubin: 0.7 mg/dL (ref 0.2–1.2)
Total Protein: 7.4 g/dL (ref 6.1–8.1)

## 2020-01-15 LAB — LIPID PANEL
Cholesterol: 211 mg/dL — ABNORMAL HIGH (ref <200)
HDL: 72 mg/dL (ref >=50)
LDL Cholesterol (Calc): 119 mg/dL (calc) — ABNORMAL HIGH
Non-HDL Cholesterol (Calc): 139 mg/dL (calc) — ABNORMAL HIGH (ref <130)
Total CHOL/HDL Ratio: 2.9 (calc) (ref <5.0)
Triglycerides: 95 mg/dL (ref <150)

## 2020-01-15 LAB — CBC
HCT: 40.1 % (ref 35.0–45.0)
Hemoglobin: 13.1 g/dL (ref 11.7–15.5)
MCH: 30.4 pg (ref 27.0–33.0)
MCHC: 32.7 g/dL (ref 32.0–36.0)
MCV: 93 fL (ref 80.0–100.0)
MPV: 10.1 fL (ref 7.5–12.5)
Platelets: 340 10*3/uL (ref 140–400)
RBC: 4.31 10*6/uL (ref 3.80–5.10)
RDW: 11.8 % (ref 11.0–15.0)
WBC: 4.4 10*3/uL (ref 3.8–10.8)

## 2020-01-17 ENCOUNTER — Other Ambulatory Visit: Payer: Self-pay | Admitting: Family Medicine

## 2020-01-17 DIAGNOSIS — R0683 Snoring: Secondary | ICD-10-CM

## 2020-01-18 ENCOUNTER — Other Ambulatory Visit: Payer: Self-pay

## 2020-01-18 ENCOUNTER — Encounter: Payer: Self-pay | Admitting: Internal Medicine

## 2020-01-18 ENCOUNTER — Ambulatory Visit (INDEPENDENT_AMBULATORY_CARE_PROVIDER_SITE_OTHER): Payer: Medicare Other | Admitting: Internal Medicine

## 2020-01-18 VITALS — BP 142/78 | HR 69 | Temp 97.4°F | Ht 62.0 in | Wt 134.0 lb

## 2020-01-18 DIAGNOSIS — E559 Vitamin D deficiency, unspecified: Secondary | ICD-10-CM | POA: Diagnosis not present

## 2020-01-18 DIAGNOSIS — I10 Essential (primary) hypertension: Secondary | ICD-10-CM | POA: Diagnosis not present

## 2020-01-18 DIAGNOSIS — E7849 Other hyperlipidemia: Secondary | ICD-10-CM | POA: Diagnosis not present

## 2020-01-18 NOTE — Assessment & Plan Note (Signed)
Lipid panel reviewed and discussed with the patient Diet modification advised

## 2020-01-18 NOTE — Progress Notes (Signed)
Established Patient Office Visit  Subjective:  Patient ID: Christina Miles, female    DOB: 08-26-1945  Age: 74 y.o. MRN: [id number]  CC:  Chief Complaint  Patient presents with  . Follow-up    b/p     HPI Christina Miles is a 74 year old female with past medical history of hypertension, hyperlipidemia, depression and vitamin D deficiency presents for follow-up of her blood pressure.  Patient takes her blood pressure at home, which ranges between 110-130/66-77.  Patient takes amlodipine and HCTZ regularly and tolerates them well.  Patient denies headache, dizziness, chest pain, dyspnea or palpitations.  Patient takes vitamin D 50,000 IU every week.  Patient is going to have DEXA scan after 2 weeks.  Patient denies any recent fall or injury.  Past Medical History:  Diagnosis Date  . Depression 2014  . Hepatitis   . Hyperlipidemia   . Hypertension   . Reduced vision 2015   right, had hemmorhage, treated by [name], injections    Past Surgical History:  Procedure Laterality Date  . ABDOMINAL HYSTERECTOMY  1985   . ? mass on ovary  . BACK SURGERY    . CHOLECYSTECTOMY    . COLONOSCOPY  02/20/2012   Procedure: COLONOSCOPY;  Surgeon: Rogene Houston, MD;  [facility];  Service: Endoscopy;  Laterality: N/A;  930  . COLONOSCOPY     Patient states that this was 3-4 years ago @[facility]   . COLONOSCOPY N/A 10/24/2017   Procedure: COLONOSCOPY;  Surgeon: Rogene Houston, MD;  [facility];  Service: Endoscopy;  Laterality: N/A;  1300  . GLAUCOMA SURGERY     05/2017  . POLYPECTOMY  10/24/2017   Procedure: POLYPECTOMY;  Surgeon: Rogene Houston, MD;  [facility]: [facility];  Service: Endoscopy;;  colon  . SPINE SURGERY  1998 approx   [place]    Family History  Problem Relation Age of Onset  . Colon cancer Father   . Dementia Mother   . Diabetes Brother   . Hypertension Brother   . Hypertension Brother     Social History   Socioeconomic History  . Marital  status: Married    Spouse name: Not on file  . Number of children: Not on file  . Years of education: Not on file  . Highest education level: Not on file  Occupational History  . Not on file  Tobacco Use  . Smoking status: Former Smoker    Packs/day: 1.00    Years: 10.00    Pack years: 10.00    Types: Cigarettes  . Smokeless tobacco: Never Used  Vaping Use  . Vaping Use: Never used  Substance and Sexual Activity  . Alcohol use: No    Alcohol/week: 0.0 standard drinks  . Drug use: No  . Sexual activity: Yes  Other Topics Concern  . Not on file  Social History Narrative  . Not on file   Social Determinants of Health   Financial Resource Strain:   . Difficulty of Paying Living Expenses: Not on file  Food Insecurity: No Food Insecurity  . Worried About Charity fundraiser in the Last Year: Never true  . Ran Out of Food in the Last Year: Never true  Transportation Needs: No Transportation Needs  . Lack of Transportation (Medical): No  . Lack of Transportation (Non-Medical): No  Physical Activity: Sufficiently Active  . Days of Exercise per Week: 5 days  . Minutes of Exercise per Session: 30 min  Stress: No Stress Concern Present  . Feeling of Stress : Not at all  Social Connections: Moderately Integrated  . Frequency of Communication with Friends and Family: Twice a week  . Frequency of Social Gatherings with Friends and Family: Once a week  . Attends Religious Services: More than 4 times per year  . Active Member of Clubs or Organizations: No  . Attends Archivist Meetings: Never  . Marital Status: Married  Human resources officer Violence:   . Fear of Current or Ex-Partner: Not on file  . Emotionally Abused: Not on file  . Physically Abused: Not on file  . Sexually Abused: Not on file    Outpatient Medications Prior to Visit  Medication Sig Dispense Refill  . amLODipine (NORVASC) 5 MG tablet Take 1 tablet (5 mg total) by mouth daily. 30 tablet 3  . Calcium  Carbonate-Vitamin D (CALTRATE 600+D) 600-400 MG-UNIT per tablet Take 2 tablets by mouth daily.    . ergocalciferol (VITAMIN D2) 1.25 MG (50000 UT) capsule Take 1 capsule (50,000 Units total) by mouth once a week. One capsule once weekly 12 capsule 1  . hydrochlorothiazide (HYDRODIURIL) 25 MG tablet TAKE (1) TABLET BY MOUTH ONCE DAILY. 90 tablet 1  . latanoprost (XALATAN) 0.005 % ophthalmic solution Place 1 drop into the left eye at bedtime.     . mirtazapine (REMERON) 7.5 MG tablet TAKE ONE TABLET BY MOUTH AT BEDTIME. 30 tablet 0  . potassium chloride (KLOR-CON) 10 MEQ tablet Take 1 tablet (10 mEq total) by mouth 2 (two) times daily. 180 tablet 1  . timolol (TIMOPTIC) 0.5 % ophthalmic solution Place 1 drop into both eyes every morning.  (Patient not taking: Reported on 01/18/2020)     No facility-administered medications prior to visit.    Allergies  Allergen Reactions  . Ace Inhibitors Cough    ROS Review of Systems  Constitutional: Negative for chills and fever.  HENT: Negative for congestion, sinus pressure, sinus pain and sore throat.   Eyes: Negative for pain and discharge.  Respiratory: Negative for cough and shortness of breath.   Cardiovascular: Negative for chest pain and palpitations.  Gastrointestinal: Negative for abdominal pain, constipation, diarrhea, nausea and vomiting.  Endocrine: Negative for polydipsia and polyuria.  Genitourinary: Negative for dysuria and hematuria.  Musculoskeletal: Negative for neck pain and neck stiffness.  Skin: Negative for rash.  Neurological: Negative for dizziness and weakness.  Psychiatric/Behavioral: Negative for agitation and behavioral problems.      Objective:    Physical Exam Vitals reviewed.  Constitutional:      General: She is not in acute distress.    Appearance: She is not diaphoretic.  HENT:     Head: Normocephalic and atraumatic.     Nose: Nose normal. No congestion.     Mouth/Throat:     Mouth: Mucous membranes are  moist.     Pharynx: No posterior oropharyngeal erythema.  Eyes:     General: No scleral icterus.    Extraocular Movements: Extraocular movements intact.     Pupils: Pupils are equal, round, and reactive to light.  Cardiovascular:     Rate and Rhythm: Normal rate and regular rhythm.     Heart sounds: Normal heart sounds. No murmur heard.   Pulmonary:     Breath sounds: Normal breath sounds. No wheezing or rales.  Abdominal:     Palpations: Abdomen is soft.     Tenderness: There is no abdominal tenderness.  Musculoskeletal:     Cervical back:  Neck supple. No tenderness.     Right lower leg: No edema.     Left lower leg: No edema.  Skin:    General: Skin is warm.     Findings: No rash.  Neurological:     General: No focal deficit present.     Mental Status: She is alert and oriented to person, place, and time.     Sensory: No sensory deficit.     Motor: No weakness.  Psychiatric:        Mood and Affect: Mood normal.        Behavior: Behavior normal.     BP (!) 142/78 (BP Location: Right Arm, Patient Position: Sitting, Cuff Size: Normal)   Pulse 69   Temp (!) 97.4 F (36.3 C) (Tympanic)   Ht 5\' 2"  (1.575 m)   Wt 134 lb (60.8 kg)   SpO2 100%   BMI 24.51 kg/m  Wt Readings from Last 3 Encounters:  01/18/20 134 lb (60.8 kg)  01/04/20 133 lb (60.3 kg)  10/06/19 137 lb 0.6 oz (62.2 kg)     There are no preventive care reminders to display for this patient.  There are no preventive care reminders to display for this patient.  Lab Results  Component Value Date   TSH 1.95 03/19/2019   Lab Results  Component Value Date   WBC 4.4 01/14/2020   HGB 13.1 01/14/2020   HCT 40.1 01/14/2020   MCV 93.0 01/14/2020   PLT 340 01/14/2020   Lab Results  Component Value Date   NA 138 01/14/2020   K 4.4 01/14/2020   CO2 31 01/14/2020   GLUCOSE 97 01/14/2020   BUN 20 01/14/2020   CREATININE 1.05 (H) 01/14/2020   BILITOT 0.7 01/14/2020   ALKPHOS 95 10/05/2019   AST 15  01/14/2020   ALT 11 01/14/2020   PROT 7.4 01/14/2020   ALBUMIN 4.5 10/05/2019   CALCIUM 9.9 01/14/2020   Lab Results  Component Value Date   CHOL 211 (H) 01/14/2020   Lab Results  Component Value Date   HDL 72 01/14/2020   Lab Results  Component Value Date   LDLCALC 119 (H) 01/14/2020   Lab Results  Component Value Date   TRIG 95 01/14/2020   Lab Results  Component Value Date   CHOLHDL 2.9 01/14/2020   Lab Results  Component Value Date   HGBA1C 5.0 08/25/2007      Assessment & Plan:   Problem List Items Addressed This Visit      Cardiovascular and Mediastinum   Essential hypertension - Primary    BP Readings from Last 1 Encounters:  01/18/20 (!) 142/78  Home readings reviewed, ranges between 110-130/66-77] Elevated in the office, likely effect of whitecoat hypertension and/or anxiety Well-controlled at home with amlodipine and HCTZ Counseled for compliance with the medications Advised DASH diet and moderate exercise/walking, at least 150 mins/week CMP and lipid panel reviewed and discussed with the patient         Other   Hyperlipidemia    Lipid panel reviewed and discussed with the patient Diet modification advised      Vitamin D deficiency    Continue vitamin D 50,000 IU every week Follow-up DEXA scan         No orders of the defined types were placed in this encounter.   Follow-up: Return in about 4 months (around 05/17/2020).    Lindell Spar, MD

## 2020-01-18 NOTE — Patient Instructions (Signed)
Please continue to take medications as prescribed for blood pressure. Please follow DASH diet and perform moderate exercise, at least 150 mins/week.  DASH stands for Dietary Approaches to Stop Hypertension. The DASH diet is a healthy-eating plan designed to help treat or prevent high blood pressure (hypertension).  The DASH diet includes foods that are rich in potassium, calcium and magnesium. These nutrients help control blood pressure. The diet limits foods that are high in sodium, saturated fat and added sugars.  Studies have shown that the DASH diet can lower blood pressure in as little as two weeks. The diet can also lower low-density lipoprotein (LDL or "bad") cholesterol levels in the blood. High blood pressure and high LDL cholesterol levels are two major risk factors for heart disease and stroke.    DASH diet: Recommended servings The DASH diet provides daily and weekly nutritional goals. The number of servings you should have depends on your daily calorie needs.  Here's a look at the recommended servings from each food group for a 2,000-calorie-a-day DASH diet:  Grains: 6 to 8 servings a day. One serving is one slice bread, 1 ounce dry cereal, or 1/2 cup cooked cereal, rice or pasta. Vegetables: 4 to 5 servings a day. One serving is 1 cup raw leafy green vegetable, 1/2 cup cut-up raw or cooked vegetables, or 1/2 cup vegetable juice. Fruits: 4 to 5 servings a day. One serving is one medium fruit, 1/2 cup fresh, frozen or canned fruit, or 1/2 cup fruit juice. Fat-free or low-fat dairy products: 2 to 3 servings a day. One serving is 1 cup milk or yogurt, or 1 1/2 ounces cheese. Lean meats, poultry and fish: six 1-ounce servings or fewer a day. One serving is 1 ounce cooked meat, poultry or fish, or 1 egg. Nuts, seeds and legumes: 4 to 5 servings a week. One serving is 1/3 cup nuts, 2 tablespoons peanut butter, 2 tablespoons seeds, or 1/2 cup cooked legumes (dried beans or peas). Fats and  oils: 2 to 3 servings a day. One serving is 1 teaspoon soft margarine, 1 teaspoon vegetable oil, 1 tablespoon mayonnaise or 2 tablespoons salad dressing. Sweets and added sugars: 5 servings or fewer a week. One serving is 1 tablespoon sugar, jelly or jam, 1/2 cup sorbet, or 1 cup lemonade.

## 2020-01-18 NOTE — Assessment & Plan Note (Addendum)
BP Readings from Last 1 Encounters:  01/18/20 (!) 142/78  Home readings reviewed, ranges between 110-130/66-77] Elevated in the office, likely effect of whitecoat hypertension and/or anxiety Well-controlled at home with amlodipine and HCTZ Counseled for compliance with the medications Advised DASH diet and moderate exercise/walking, at least 150 mins/week CMP and lipid panel reviewed and discussed with the patient

## 2020-01-18 NOTE — Assessment & Plan Note (Signed)
Continue vitamin D 50,000 IU every week Follow-up DEXA scan

## 2020-01-21 ENCOUNTER — Ambulatory Visit: Payer: Medicare Other | Admitting: Family Medicine

## 2020-02-01 ENCOUNTER — Ambulatory Visit (HOSPITAL_COMMUNITY)
Admission: RE | Admit: 2020-02-01 | Discharge: 2020-02-01 | Disposition: A | Discharge status: Home / Self Care | Payer: Medicare Other | Source: Ambulatory Visit | Attending: Family Medicine | Admitting: Family Medicine

## 2020-02-01 ENCOUNTER — Encounter (INDEPENDENT_AMBULATORY_CARE_PROVIDER_SITE_OTHER): Payer: Medicare Other | Admitting: Ophthalmology

## 2020-02-01 ENCOUNTER — Other Ambulatory Visit: Payer: Self-pay

## 2020-02-01 DIAGNOSIS — Z780 Asymptomatic menopausal state: Secondary | ICD-10-CM | POA: Insufficient documentation

## 2020-02-01 DIAGNOSIS — M85851 Other specified disorders of bone density and structure, right thigh: Secondary | ICD-10-CM | POA: Diagnosis not present

## 2020-02-01 DIAGNOSIS — Z13820 Encounter for screening for osteoporosis: Secondary | ICD-10-CM | POA: Insufficient documentation

## 2020-02-01 DIAGNOSIS — Z1231 Encounter for screening mammogram for malignant neoplasm of breast: Secondary | ICD-10-CM | POA: Insufficient documentation

## 2020-02-02 ENCOUNTER — Encounter (INDEPENDENT_AMBULATORY_CARE_PROVIDER_SITE_OTHER): Payer: Medicare Other | Admitting: Ophthalmology

## 2020-02-03 ENCOUNTER — Other Ambulatory Visit: Payer: Self-pay

## 2020-02-03 ENCOUNTER — Encounter (INDEPENDENT_AMBULATORY_CARE_PROVIDER_SITE_OTHER): Payer: Medicare Other | Admitting: Ophthalmology

## 2020-02-03 DIAGNOSIS — H35033 Hypertensive retinopathy, bilateral: Secondary | ICD-10-CM

## 2020-02-03 DIAGNOSIS — H34831 Tributary (branch) retinal vein occlusion, right eye, with macular edema: Secondary | ICD-10-CM | POA: Diagnosis not present

## 2020-02-03 DIAGNOSIS — H43813 Vitreous degeneration, bilateral: Secondary | ICD-10-CM

## 2020-02-03 DIAGNOSIS — I10 Essential (primary) hypertension: Secondary | ICD-10-CM

## 2020-02-10 ENCOUNTER — Encounter (INDEPENDENT_AMBULATORY_CARE_PROVIDER_SITE_OTHER): Payer: Medicare Other | Admitting: Ophthalmology

## 2020-02-14 ENCOUNTER — Other Ambulatory Visit: Payer: Self-pay | Admitting: Family Medicine

## 2020-02-17 DIAGNOSIS — R9431 Abnormal electrocardiogram [ECG] [EKG]: Secondary | ICD-10-CM | POA: Insufficient documentation

## 2020-02-17 NOTE — Progress Notes (Signed)
Cardiology Office Note   Date:  02/19/2020   ID:  Christina, Miles [birth year], MRN [id number]  PCP:  Fayrene Helper, MD  Cardiologist:   Minus Breeding, MD Referring:  Fayrene Helper, MD  No chief complaint on file.     History of Present Illness: Christina Miles is a 74 y.o. female who was referred by Fayrene Helper, MD for evaluation of an abnormal EKG and HTN.   She had a routine EKG that demonstrated possible left atrial right atrial enlargement.  She is never had any prior cardiac history.  She never had any work-up in the past.  She walks for exercise. The patient denies any new symptoms such as chest discomfort, neck or arm discomfort. There has been no new shortness of breath, PND or orthopnea. There have been no reported palpitations, presyncope or syncope.    Past Medical History:  Diagnosis Date  . Depression 2014  . Hepatitis   . Hypertension   . Reduced vision 2015   right, had hemmorhage, treated by [name], injections    Past Surgical History:  Procedure Laterality Date  . ABDOMINAL HYSTERECTOMY  1985   . ? mass on ovary  . BACK SURGERY    . CHOLECYSTECTOMY    . COLONOSCOPY  02/20/2012   Procedure: COLONOSCOPY;  Surgeon: Rogene Houston, MD;  [facility];  Service: Endoscopy;  Laterality: N/A;  930  . COLONOSCOPY     Patient states that this was 3-4 years ago @[facility]   . COLONOSCOPY N/A 10/24/2017   Procedure: COLONOSCOPY;  Surgeon: Rogene Houston, MD;  [facility];  Service: Endoscopy;  Laterality: N/A;  1300  . GLAUCOMA SURGERY     05/2017  . POLYPECTOMY  10/24/2017   Procedure: POLYPECTOMY;  Surgeon: Rogene Houston, MD;  [facility];  Service: Endoscopy;;  colon  . SPINE SURGERY  1998 approx   [place]     Current Outpatient Medications  Medication Sig Dispense Refill  . amLODipine (NORVASC) 5 MG tablet TAKE ONE TABLET BY MOUTH DAILY 30 tablet 0  . Calcium Carbonate-Vitamin D (CALTRATE 600+D)  600-400 MG-UNIT per tablet Take 2 tablets by mouth daily.    . ergocalciferol (VITAMIN D2) 1.25 MG (50000 UT) capsule Take 1 capsule (50,000 Units total) by mouth once a week. One capsule once weekly 12 capsule 1  . hydrochlorothiazide (HYDRODIURIL) 25 MG tablet TAKE (1) TABLET BY MOUTH ONCE DAILY. 90 tablet 0  . latanoprost (XALATAN) 0.005 % ophthalmic solution Place 1 drop into the left eye at bedtime.     . mirtazapine (REMERON) 7.5 MG tablet TAKE ONE TABLET BY MOUTH AT BEDTIME. 30 tablet 0  . potassium chloride (KLOR-CON) 10 MEQ tablet TAKE (1) TABLET BY MOUTH TWICE DAILY. 180 tablet 0   No current facility-administered medications for this visit.    Allergies:   Ace inhibitors    Social History:  The patient  reports that she has quit smoking. Her smoking use included cigarettes. She has a 10.00 pack-year smoking history. She has never used smokeless tobacco. She reports that she does not drink alcohol and does not use drugs.   Family History:  The patient's family history includes Colon cancer in her father; Dementia in her mother; Diabetes in her brother; Hypertension in her brother and brother.    ROS:  Please see the history of present illness.   Otherwise, review of systems are positive for none.  All other systems are reviewed and negative.    PHYSICAL EXAM: VS:  BP 140/80   Pulse 62   Ht 5\' 2"  (1.575 m)   Wt 138 lb (62.6 kg)   SpO2 99%   BMI 25.24 kg/m  , BMI Body mass index is 25.24 kg/m. GENERAL:  Well appearing HEENT:  Pupils equal round and reactive, fundi not visualized, oral mucosa unremarkable NECK:  No jugular venous distention, waveform within normal limits, carotid upstroke brisk and symmetric, no bruits, no thyromegaly LYMPHATICS:  No cervical, inguinal adenopathy LUNGS:  Clear to auscultation bilaterally BACK:  No CVA tenderness CHEST:  Unremarkable HEART:  PMI not displaced or sustained,S1 and S2 within normal limits, no S3, no S4, no clicks, no rubs, no  murmurs ABD:  Flat, positive bowel sounds normal in frequency in pitch, no bruits, no rebound, no guarding, no midline pulsatile mass, no hepatomegaly, no splenomegaly EXT:  2 plus pulses throughout, no edema, no cyanosis no clubbing SKIN:  No rashes no nodules NEURO:  Cranial nerves II through XII grossly intact, motor grossly intact throughout PSYCH:  Cognitively intact, oriented to person place and time    EKG:  EKG is ordered today. The ekg ordered today demonstrates sinus rhythm, rate 65, axis within normal limits, intervals within normal limits, no acute ST-T wave changes.   Recent Labs: 03/19/2019: TSH 1.95 01/14/2020: ALT 11; BUN 20; Creat 1.05; Hemoglobin 13.1; Platelets 340; Potassium 4.4; Sodium 138    Lipid Panel    Component Value Date/Time   CHOL 211 (H) 01/14/2020 0832   CHOL 223 (H) 10/05/2019 1206   TRIG 95 01/14/2020 0832   HDL 72 01/14/2020 0832   HDL 79 10/05/2019 1206   CHOLHDL 2.9 01/14/2020 0832   VLDL 14 06/04/2016 1145   LDLCALC 119 (H) 01/14/2020 0832      Wt Readings from Last 3 Encounters:  02/19/20 138 lb (62.6 kg)  01/18/20 134 lb (60.8 kg)  01/04/20 133 lb (60.3 kg)      Other studies Reviewed: Additional studies/ records that were reviewed today include: Labs, primary care office EKG. Review of the above records demonstrates:  Please see elsewhere in the note.     ASSESSMENT AND PLAN:  ABNORMAL EKG:    The EKG today is unremarkable.  There was some suggestion of possible right atrial and left atrial enlargement but I would not suggest that this was a clinically significant finding.  She has no symptoms.  He has normal physical exam.  I do not think further work-up is suggested.  HTN: Her blood pressure is well controlled at home.  It felt tiny bit high today.  However, I do not think further change in her meds is necessary.  Current medicines are reviewed at length with the patient today.  The patient does not have concerns regarding  medicines.  The following changes have been made:  no change  Labs/ tests ordered today include: None No orders of the defined types were placed in this encounter.    Disposition:   FU with me as needed.      Signed, Minus Breeding, MD  02/19/2020 3:16 PM    [facility]

## 2020-02-19 ENCOUNTER — Ambulatory Visit (INDEPENDENT_AMBULATORY_CARE_PROVIDER_SITE_OTHER): Payer: Medicare Other | Admitting: Cardiology

## 2020-02-19 ENCOUNTER — Encounter: Payer: Self-pay | Admitting: Cardiology

## 2020-02-19 VITALS — BP 140/80 | HR 62 | Ht 62.0 in | Wt 138.0 lb

## 2020-02-19 DIAGNOSIS — I10 Essential (primary) hypertension: Secondary | ICD-10-CM | POA: Diagnosis not present

## 2020-02-19 DIAGNOSIS — R9431 Abnormal electrocardiogram [ECG] [EKG]: Secondary | ICD-10-CM | POA: Diagnosis not present

## 2020-02-19 NOTE — Patient Instructions (Addendum)

## 2020-02-19 NOTE — Addendum Note (Signed)
Addended by: Julian Hy T on: 02/19/2020 04:58 PM   Modules accepted: Orders

## 2020-03-02 ENCOUNTER — Encounter (INDEPENDENT_AMBULATORY_CARE_PROVIDER_SITE_OTHER): Payer: Medicare Other | Admitting: Ophthalmology

## 2020-03-02 ENCOUNTER — Other Ambulatory Visit: Payer: Self-pay

## 2020-03-02 DIAGNOSIS — H34831 Tributary (branch) retinal vein occlusion, right eye, with macular edema: Secondary | ICD-10-CM | POA: Diagnosis not present

## 2020-03-02 DIAGNOSIS — I10 Essential (primary) hypertension: Secondary | ICD-10-CM

## 2020-03-02 DIAGNOSIS — H43813 Vitreous degeneration, bilateral: Secondary | ICD-10-CM

## 2020-03-02 DIAGNOSIS — H35033 Hypertensive retinopathy, bilateral: Secondary | ICD-10-CM | POA: Diagnosis not present

## 2020-03-17 ENCOUNTER — Other Ambulatory Visit (INDEPENDENT_AMBULATORY_CARE_PROVIDER_SITE_OTHER): Payer: Self-pay | Admitting: *Deleted

## 2020-03-17 DIAGNOSIS — R772 Abnormality of alphafetoprotein: Secondary | ICD-10-CM

## 2020-03-21 ENCOUNTER — Ambulatory Visit (INDEPENDENT_AMBULATORY_CARE_PROVIDER_SITE_OTHER): Payer: Medicare Other | Admitting: Pulmonary Disease

## 2020-03-21 ENCOUNTER — Encounter: Payer: Self-pay | Admitting: Pulmonary Disease

## 2020-03-21 ENCOUNTER — Other Ambulatory Visit: Payer: Self-pay

## 2020-03-21 ENCOUNTER — Other Ambulatory Visit: Payer: Self-pay | Admitting: Family Medicine

## 2020-03-21 VITALS — BP 126/64 | HR 70 | Temp 97.3°F | Ht 62.5 in | Wt 138.4 lb

## 2020-03-21 DIAGNOSIS — R0683 Snoring: Secondary | ICD-10-CM | POA: Diagnosis not present

## 2020-03-21 NOTE — Patient Instructions (Signed)
Will arrange for home sleep study Will call to arrange for follow up after sleep study reviewed  

## 2020-03-21 NOTE — Progress Notes (Signed)
[facility]  Chief Complaint  Patient presents with  . Consult    Sleep consult    Constitutional:  BP 126/64 (BP Location: Left Arm, Cuff Size: Normal)   Pulse 70   Temp (!) 97.3 F (36.3 C) (Other (Comment)) Comment (Src): wrist  Ht 5' 2.5" (1.588 m)   Wt 138 lb 6.4 oz (62.8 kg)   SpO2 100% Comment: Room air  BMI 24.91 kg/m   Past Medical History:  Depression, HTN, Hepatitis  Past Surgical History:  Her  has a past surgical history that includes Back surgery; Colonoscopy (02/20/2012); Spine surgery (1998 approx); Cholecystectomy; Abdominal hysterectomy (1985); Colonoscopy; Glaucoma surgery; Colonoscopy (N/A, 10/24/2017); and polypectomy (10/24/2017).  Brief Summary:  Christina Miles is a 75 y.o. female former smoker with snoring.      Subjective:   Her husband has been concerned about her snoring.  This has been going on for several years.  She will wake up hearing herself snore when sleeping on her back, and feels her throat closing.  She goes to sleep between 10 and 11 pm.  She falls asleep in 15 minutes.  She wakes up 2 times to use the bathroom.  She gets out of bed at 630 am.  She feels tired in the morning.  She denies morning headache.  She does not use anything to help her stay awake.  She uses remeron for depression and this helps her sleep.  She takes this between 9 and 10 pm.  She has been using this for several years.  She does sometimes get leg cramps, but recently improved after she started sleeping with a pillow between her legs.  She denies sleep walking, sleep talking, bruxism, or nightmares.  There is no history of restless legs.  She denies sleep hallucinations, sleep paralysis, or cataplexy.  The Epworth score is 2 out of 24.    Physical Exam:   Appearance - well kempt   ENMT - no sinus tenderness, no oral exudate, no LAN, Mallampati 2 airway, no stridor, scalloped tongue border, decreased AP  diameter  Respiratory - equal breath sounds bilaterally, no wheezing or rales  CV - s1s2 regular rate and rhythm, no murmurs  Ext - no clubbing, no edema  Skin - no rashes  Psych - normal mood and affect   Sleep Tests:    Social History:  She  reports that she quit smoking about 22 years ago. Her smoking use included cigarettes. She has a 10.00 pack-year smoking history. She has never used smokeless tobacco. She reports that she does not drink alcohol and does not use drugs.  Family History:  Her family history includes Colon cancer in her father; Dementia in her mother; Diabetes in her brother; Hypertension in her brother and brother.    Discussion:  She has snoring, sleep disruption, apnea and daytime sleepiness.  She has history of depression and hypertension.  I am concerned she could have obstructive sleep apnea.  Assessment/Plan:   Snoring with excessive daytime sleepiness. - will need to arrange for a home sleep study  Obesity. - discussed how weight can impact sleep and risk for sleep disordered breathing - discussed options to assist with weight loss: combination of diet modification, cardiovascular and strength training exercises  Cardiovascular risk. - had an extensive discussion regarding the adverse health consequences related to untreated sleep disordered breathing - specifically discussed the risks for hypertension, coronary artery disease, cardiac dysrhythmias, cerebrovascular disease, and diabetes -  lifestyle modification discussed  Safe driving practices. - discussed how sleep disruption can increase risk of accidents, particularly when driving - safe driving practices were discussed  Therapies for obstructive sleep apnea. - if the sleep study shows significant sleep apnea, then various therapies for treatment were reviewed: CPAP, oral appliance, and surgical interventions  Time Spent Involved in Patient Care on Day of Examination:  32  minutes  Follow up:  Patient Instructions  Will arrange for home sleep study Will call to arrange for follow up after sleep study reviewed    Medication List:   Allergies as of 03/21/2020      Reactions   Ace Inhibitors Cough      Medication List       Accurate as of March 21, 2020  9:58 AM. If you have any questions, ask your nurse or doctor.        amLODipine 5 MG tablet Commonly known as: NORVASC TAKE ONE TABLET BY MOUTH DAILY   brimonidine-timolol 0.2-0.5 % ophthalmic solution Commonly known as: COMBIGAN Place 1 drop into both eyes every 12 (twelve) hours.   Calcium Carbonate-Vitamin D 600-400 MG-UNIT tablet Take 2 tablets by mouth daily.   ergocalciferol 1.25 MG (50000 UT) capsule Commonly known as: VITAMIN D2 Take 1 capsule (50,000 Units total) by mouth once a week. One capsule once weekly   hydrochlorothiazide 25 MG tablet Commonly known as: HYDRODIURIL TAKE (1) TABLET BY MOUTH ONCE DAILY.   latanoprost 0.005 % ophthalmic solution Commonly known as: XALATAN Place 1 drop into both eyes at bedtime.   mirtazapine 7.5 MG tablet Commonly known as: REMERON TAKE ONE TABLET BY MOUTH AT BEDTIME.   potassium chloride 10 MEQ tablet Commonly known as: KLOR-CON TAKE (1) TABLET BY MOUTH TWICE DAILY.       Signature:  Coralyn Helling, MD [facility] Pager - [phone] 03/21/2020, 9:58 AM

## 2020-04-06 ENCOUNTER — Encounter (INDEPENDENT_AMBULATORY_CARE_PROVIDER_SITE_OTHER): Payer: Medicare Other | Admitting: Ophthalmology

## 2020-04-06 ENCOUNTER — Other Ambulatory Visit: Payer: Self-pay

## 2020-04-06 DIAGNOSIS — H35033 Hypertensive retinopathy, bilateral: Secondary | ICD-10-CM | POA: Diagnosis not present

## 2020-04-06 DIAGNOSIS — I10 Essential (primary) hypertension: Secondary | ICD-10-CM | POA: Diagnosis not present

## 2020-04-06 DIAGNOSIS — H34831 Tributary (branch) retinal vein occlusion, right eye, with macular edema: Secondary | ICD-10-CM | POA: Diagnosis not present

## 2020-04-06 DIAGNOSIS — H43813 Vitreous degeneration, bilateral: Secondary | ICD-10-CM

## 2020-04-21 ENCOUNTER — Ambulatory Visit: Payer: Medicare Other

## 2020-04-21 ENCOUNTER — Other Ambulatory Visit: Payer: Self-pay | Admitting: Family Medicine

## 2020-04-21 ENCOUNTER — Other Ambulatory Visit: Payer: Self-pay

## 2020-04-21 DIAGNOSIS — R0683 Snoring: Secondary | ICD-10-CM

## 2020-04-21 DIAGNOSIS — G4733 Obstructive sleep apnea (adult) (pediatric): Secondary | ICD-10-CM

## 2020-05-11 ENCOUNTER — Other Ambulatory Visit: Payer: Self-pay

## 2020-05-11 ENCOUNTER — Encounter (INDEPENDENT_AMBULATORY_CARE_PROVIDER_SITE_OTHER): Payer: Medicare Other | Admitting: Ophthalmology

## 2020-05-11 DIAGNOSIS — I10 Essential (primary) hypertension: Secondary | ICD-10-CM

## 2020-05-11 DIAGNOSIS — H43813 Vitreous degeneration, bilateral: Secondary | ICD-10-CM | POA: Diagnosis not present

## 2020-05-11 DIAGNOSIS — H35033 Hypertensive retinopathy, bilateral: Secondary | ICD-10-CM | POA: Diagnosis not present

## 2020-05-11 DIAGNOSIS — H34831 Tributary (branch) retinal vein occlusion, right eye, with macular edema: Secondary | ICD-10-CM

## 2020-05-13 ENCOUNTER — Telehealth: Payer: Self-pay | Admitting: Pulmonary Disease

## 2020-05-13 DIAGNOSIS — G4733 Obstructive sleep apnea (adult) (pediatric): Secondary | ICD-10-CM

## 2020-05-13 NOTE — Telephone Encounter (Signed)
Called and went over HST results per Dr Halford Chessman with patient. All questions answered and patient expressed full understanding. Scheduled TELEVISIT for Monday 05/16/2020 at 9am with NP per patient request and availability. Patient agreeable to time, date and phone number to be used. Nothing further needed at this time.

## 2020-05-13 NOTE — Progress Notes (Signed)
Virtual Visit via Telephone Note  I connected with Christina Miles on 05/13/20 at  9:00 AM EST by telephone and verified that I am speaking with the correct person using two identifiers.  Location: Patient: Home Provider: Office   I discussed the limitations, risks, security and privacy concerns of performing an evaluation and management service by telephone and the availability of in person appointments. I also discussed with the patient that there may be a patient responsible charge related to this service. The patient expressed understanding and agreed to proceed.   History of Present Illness: 75 year old female, former smoker. PMH significant for HTN, seasonal allergies, vitamin D, overweight. Patient of Dr. Halford Chessman, seen for initial consult on 03/21/20 for snoring.   05/16/2020 Patient contacted today to review sleep study results. HST 04/21/20 showed mild OSA, AHI 7/hr with SpO2 low 86%.  Her husband was originally concerned regarding her snoring which has been going on for years. She wakes up on average twice a night to use the rest room. Her husband does report that she sleep talks. She takes Remeron at bedtime. No history of sleep walking or restless leg syndrome. Epworth 2/24.   Observations/Objective:  - Able to speak in full sentences; no overt shortness of breath or wheezing   - HST 04/21/20 showed mild OSA, AHI 7/hr with SpO2 low 86% (baseline 96%)  Assessment and Plan:  Mild OSA: - HST showed mild OSA, AHI 7/hr.  - Discussed treatment options including weight loss, side sleeping position, oral appliance, CPAP or referral to ENT. Her clinical symptoms and testing findings do not appear significant enough to warrant CPAP therapy at this time. She would like to conservatively manage by continuing to maintain healthy weight and focus on side sleeping position. May consider oral appliance in the future.  - Advised not to drive if excessively tired, avoid sedating medication/alcohol  before bedtime as these can worsen underlying sleep apnea   Follow Up Instructions:  - FU in 6 months to monitor or sooner if sleep symptoms worsen   I discussed the assessment and treatment plan with the patient. The patient was provided an opportunity to ask questions and all were answered. The patient agreed with the plan and demonstrated an understanding of the instructions.   The patient was advised to call back or seek an in-person evaluation if the symptoms worsen or if the condition fails to improve as anticipated.  I provided 15 minutes of non-face-to-face time during this encounter.   Martyn Ehrich, NP

## 2020-05-13 NOTE — Telephone Encounter (Signed)
HST 04/21/20 >> AHI 7, SpO2 low 86%   Please inform her that her sleep study shows mild obstructive sleep apnea.  Please arrange for ROV with me or NP to discuss treatment options.

## 2020-05-16 ENCOUNTER — Ambulatory Visit (INDEPENDENT_AMBULATORY_CARE_PROVIDER_SITE_OTHER): Payer: Medicare Other | Admitting: Primary Care

## 2020-05-16 ENCOUNTER — Encounter: Payer: Self-pay | Admitting: Primary Care

## 2020-05-16 ENCOUNTER — Other Ambulatory Visit: Payer: Self-pay

## 2020-05-16 DIAGNOSIS — G4730 Sleep apnea, unspecified: Secondary | ICD-10-CM

## 2020-05-16 NOTE — Patient Instructions (Addendum)
Home sleep study showed mild OSA; on average you had 7 apneic/hyponeic events an hours (normal is <5/hr).   Treatment options including weight loss, side sleeping position, oral appliance, CPAP or referral to ENT   You can consider oral appliance or CPAP if symptoms worsen. If you would like to consider oral appliance- we use Dr. Ron Parker [phone] or Dr. Toy Cookey [phone]. CPAP information attached below.   Continue to maintain healthy weight and encourage side sleeping position.   Do not drive if experiencing excessive daytime fatigue or somnolence  Do drink alcohol in excess or take sedating medication prior to bedtime as this can potentially worsen underlying sleep apnea   Follow-up:  6 months with Dr. Halford Chessman    Living With Sleep Apnea Sleep apnea is a condition in which breathing pauses or becomes shallow during sleep. Sleep apnea is most commonly caused by a collapsed or blocked airway. People with sleep apnea snore loudly and have times when they gasp and stop breathing for 10 seconds or more during sleep. This happens over and over during the night. This disrupts your sleep and keeps your body from getting the rest that it needs, which can cause tiredness and lack of energy (fatigue) during the day. The breaks in breathing also interrupt the deep sleep that you need to feel rested. Even if you do not completely wake up from the gaps in breathing, your sleep may not be restful. You may also have a headache in the morning and low energy during the day, and you may feel anxious or depressed. How can sleep apnea affect me? Sleep apnea increases your chances of extreme tiredness during the day (daytime fatigue). It can also increase your risk for health conditions, such as:  Heart attack.  Stroke.  Diabetes.  Heart failure.  Irregular heartbeat.  High blood pressure. If you have daytime fatigue as a result of sleep apnea, you may be more likely to:  Perform poorly at school or  work.  Fall asleep while driving.  Have difficulty with attention.  Develop depression or anxiety.  Become severely overweight (obese).  Have sexual dysfunction. What actions can I take to manage sleep apnea? Sleep apnea treatment  If you were given a device to open your airway while you sleep, use it only as told by your health care provider. You may be given: ? An oral appliance. This is a custom-made mouthpiece that shifts your lower jaw forward. ? A continuous positive airway pressure (CPAP) device. This device blows air through a mask when you breathe out (exhale). ? A nasal expiratory positive airway pressure (EPAP) device. This device has valves that you put into each nostril. ? A bi-level positive airway pressure (BPAP) device. This device blows air through a mask when you breathe in (inhale) and breathe out (exhale).  You may need surgery if other treatments do not work for you.   Sleep habits  Go to sleep and wake up at the same time every day. This helps set your internal clock (circadian rhythm) for sleeping. ? If you stay up later than usual, such as on weekends, try to get up in the morning within 2 hours of your normal wake time.  Try to get at least 7-9 hours of sleep each night.  Stop computer, tablet, and mobile phone use a few hours before bedtime.  Do not take long naps during the day. If you nap, limit it to 30 minutes.  Have a relaxing bedtime routine. Reading or listening  to music may relax you and help you sleep.  Use your bedroom only for sleep. ? Keep your television and computer out of your bedroom. ? Keep your bedroom cool, dark, and quiet. ? Use a supportive mattress and pillows.  Follow your health care provider's instructions for other changes to sleep habits. Nutrition  Do not eat heavy meals in the evening.  Do not have caffeine in the later part of the day. The effects of caffeine can last for more than 5 hours.  Follow your health care  provider's or dietitian's instructions for any diet changes. Lifestyle  Do not drink alcohol before bedtime. Alcohol can cause you to fall asleep at first, but then it can cause you to wake up in the middle of the night and have trouble getting back to sleep.  Do not use any products that contain nicotine or tobacco, such as cigarettes and e-cigarettes. If you need help quitting, ask your health care provider.      Medicines  Take over-the-counter and prescription medicines only as told by your health care provider.  Do not use over-the-counter sleep medicine. You can become dependent on this medicine, and it can make sleep apnea worse.  Do not use medicines, such as sedatives and narcotics, unless told by your health care provider. Activity  Exercise on most days, but avoid exercising in the evening. Exercising near bedtime can interfere with sleeping.  If possible, spend time outside every day. Natural light helps regulate your circadian rhythm. General information  Lose weight if you need to, and maintain a healthy weight.  Keep all follow-up visits as told by your health care provider. This is important.  If you are having surgery, make sure to tell your health care provider that you have sleep apnea. You may need to bring your device with you. Where to find more information Learn more about sleep apnea and daytime fatigue from:  American Sleep Association: sleepassociation.[place]: sleepfoundation.org  National Heart, Lung, and Blood Institute: https://www.[place]man-[place].biz/ Summary  Sleep apnea can cause daytime fatigue and other serious health conditions.  Both sleep apnea and daytime fatigue can be bad for your health and well-being.  You may need to wear a device while sleeping to help keep your airway open.  If you are having surgery, make sure to tell your health care provider that you have sleep apnea. You may need to bring your device with you.  Making  changes to sleep habits, diet, lifestyle, and activity can help you manage sleep apnea. This information is not intended to replace advice given to you by your health care provider. Make sure you discuss any questions you have with your health care provider. Document Revised: 06/27/2018 Document Reviewed: 05/30/2017 Elsevier Patient Education  2021 [place].    CPAP and BPAP Information CPAP and BPAP are methods that use air pressure to keep your airways open and to help you breathe well. CPAP and BPAP use different amounts of pressure. Your health care provider will tell you whether CPAP or BPAP would be more helpful for you.  CPAP stands for "continuous positive airway pressure." With CPAP, the amount of pressure stays the same while you breathe in and out.  BPAP stands for "bi-level positive airway pressure." With BPAP, the amount of pressure will be higher when you breathe in (inhale) and lower when you breathe out(exhale). This allows you to take larger breaths. CPAP or BPAP may be used in the hospital, or your health  care provider may want you to use it at home. You may need to have a sleep study before your health care provider can order a machine for you to use at home. Why are CPAP and BPAP treatments used? CPAP or BPAP can be helpful if you have:  Sleep apnea.  Chronic obstructive pulmonary disease (COPD).  Heart failure.  Medical conditions that cause muscle weakness, including muscular dystrophy or amyotrophic lateral sclerosis (ALS).  Other problems that cause breathing to be shallow, weak, abnormal, or difficult. CPAP and BPAP are most commonly used for obstructive sleep apnea (OSA) to keep the airways from collapsing when the muscles relax during sleep. How is CPAP or BPAP administered? Both CPAP and BPAP are provided by a small machine with a flexible plastic tube that attaches to a plastic mask that you wear. Air is blown through the mask into your nose or mouth. The  amount of pressure that is used to blow the air can be adjusted on the machine. Your health care provider will set the pressure setting and help you find the best mask for you. When should CPAP or BPAP be used? In most cases, the mask only needs to be worn during sleep. Generally, the mask needs to be worn throughout the night and during any daytime naps. People with certain medical conditions may also need to wear the mask at other times when they are awake. Follow instructions from your health care provider about when to use the machine. What are some tips for using the mask?  Because the mask needs to be snug, some people feel trapped or closed-in (claustrophobic) when first using the mask. If you feel this way, you may need to get used to the mask. One way to do this is to hold the mask loosely over your nose or mouth and then gradually apply the mask more snugly. You can also gradually increase the amount of time that you use the mask.  Masks are available in various types and sizes. If your mask does not fit well, talk with your health care provider about getting a different one. Some common types of masks include: ? Full face masks, which fit over the mouth and nose. ? Nasal masks, which fit over the nose. ? Nasal pillow or prong masks, which fit into the nostrils.  If you are using a mask that fits over your nose and you tend to breathe through your mouth, a chin strap may be applied to help keep your mouth closed.  Some CPAP and BPAP machines have alarms that may sound if the mask comes off or develops a leak.  If you have trouble with the mask, it is very important that you talk with your health care provider about finding a way to make the mask easier to tolerate. Do not stop using the mask. There could be a negative impact to your health if you stop using the mask.   What are some tips for using the machine?  Place your CPAP or BPAP machine on a secure table or stand near an electrical  outlet.  Know where the on/off switch is on the machine.  Follow instructions from your health care provider about how to set the pressure on your machine and when you should use it.  Do not eat or drink while the CPAP or BPAP machine is on. Food or fluids could get pushed into your lungs by the pressure of the CPAP or BPAP.  For home use, CPAP  and BPAP machines can be rented or purchased through home health care companies. Many different brands of machines are available. Renting a machine before purchasing may help you find out which particular machine works well for you. Your insurance may also decide which machine you may get.  Keep the CPAP or BPAP machine and attachments clean. Ask your health care provider for specific instructions. Follow these instructions at home:  Do not use any products that contain nicotine or tobacco, such as cigarettes, e-cigarettes, and chewing tobacco. If you need help quitting, ask your health care provider.  Keep all follow-up visits as told by your health care provider. This is important. Contact a health care provider if:  You have redness or pressure sores on your head, face, mouth, or nose from the mask or head gear.  You have trouble using the CPAP or BPAP machine.  You cannot tolerate wearing the CPAP or BPAP mask.  Someone tells you that you snore even when wearing your CPAP or BPAP. Get help right away if:  You have trouble breathing.  You feel confused. Summary  CPAP and BPAP are methods that use air pressure to keep your airways open and to help you breathe well.  You may need to have a sleep study before your health care provider can order a machine for home use.  If you have trouble with the mask, it is very important that you talk with your health care provider about finding a way to make the mask easier to tolerate. Do not stop using the mask. There could be a negative impact to your health if you stop using the mask.  Follow  instructions from your health care provider about when to use the machine. This information is not intended to replace advice given to you by your health care provider. Make sure you discuss any questions you have with your health care provider. Document Revised: 03/27/2019 Document Reviewed: 03/30/2019 Elsevier Patient Education  2021 [employer].

## 2020-05-16 NOTE — Progress Notes (Signed)
Reviewed and agree with assessment/plan.   Chesley Mires, MD [facility] Pulmonary/Critical Care 05/16/2020, 9:54 AM Pager:  [phone]

## 2020-05-17 ENCOUNTER — Encounter: Payer: Self-pay | Admitting: Internal Medicine

## 2020-05-17 ENCOUNTER — Other Ambulatory Visit: Payer: Self-pay

## 2020-05-17 ENCOUNTER — Ambulatory Visit (INDEPENDENT_AMBULATORY_CARE_PROVIDER_SITE_OTHER): Payer: Medicare Other | Admitting: Internal Medicine

## 2020-05-17 VITALS — BP 134/75 | HR 71 | Temp 98.5°F | Resp 18 | Ht 62.0 in | Wt 137.8 lb

## 2020-05-17 DIAGNOSIS — I10 Essential (primary) hypertension: Secondary | ICD-10-CM

## 2020-05-17 DIAGNOSIS — F411 Generalized anxiety disorder: Secondary | ICD-10-CM | POA: Diagnosis not present

## 2020-05-17 DIAGNOSIS — E559 Vitamin D deficiency, unspecified: Secondary | ICD-10-CM | POA: Diagnosis not present

## 2020-05-17 DIAGNOSIS — M8580 Other specified disorders of bone density and structure, unspecified site: Secondary | ICD-10-CM

## 2020-05-17 NOTE — Assessment & Plan Note (Signed)
On Calcium and Vitamin D supplements Check Vitamin D levels

## 2020-05-17 NOTE — Progress Notes (Signed)
Established Patient Office Visit  Subjective:  Patient ID: Christina Miles, female    DOB: [birth year]  Age: 75 y.o. MRN: [id number]  CC:  Chief Complaint  Patient presents with  . Follow-up    Follow up everything is well today     HPI Christina Miles presents for follow up of her chronic medical conditions.  HTN: BP is well-controlled. Takes medications regularly. Patient denies headache, dizziness, chest pain, dyspnea or palpitations.  Anxiety: Well-controlled with Remeron. Denies sleeping difficulty.  Osteopenia: On Calcium and Vitamin D supplements.  Past Medical History:  Diagnosis Date  . Depression 2014  . Hepatitis   . Hypertension   . Reduced vision 2015   right, had hemmorhage, treated by [name], injections    Past Surgical History:  Procedure Laterality Date  . ABDOMINAL HYSTERECTOMY  1985   . ? mass on ovary  . BACK SURGERY    . CHOLECYSTECTOMY    . COLONOSCOPY  02/20/2012   Procedure: COLONOSCOPY;  Surgeon: Rogene Houston, MD;  Location: [facility];  Service: Endoscopy;  Laterality: N/A;  930  . COLONOSCOPY     Patient states that this was 3-4 years ago @[facility]   . COLONOSCOPY N/A 10/24/2017   Procedure: COLONOSCOPY;  Surgeon: Rogene Houston, MD;  Location: [facility];  Service: Endoscopy;  Laterality: N/A;  1300  . GLAUCOMA SURGERY     05/2017  . POLYPECTOMY  10/24/2017   Procedure: POLYPECTOMY;  Surgeon: Rogene Houston, MD;  Location: [facility];  Service: Endoscopy;;  colon  . SPINE SURGERY  1998 approx   [place]    Family History  Problem Relation Age of Onset  . Colon cancer Father   . Dementia Mother   . Diabetes Brother   . Hypertension Brother   . Hypertension Brother     Social History   Socioeconomic History  . Marital status: Married    Spouse name: Not on file  . Number of children: Not on file  . Years of education: Not on file  . Highest education level: Not on file  Occupational History  . Not on file  Tobacco  Use  . Smoking status: Former Smoker    Packs/day: 1.00    Years: 10.00    Pack years: 10.00    Types: Cigarettes    Quit date: 2000    Years since quitting: 22.1  . Smokeless tobacco: Never Used  Vaping Use  . Vaping Use: Never used  Substance and Sexual Activity  . Alcohol use: No    Alcohol/week: 0.0 standard drinks  . Drug use: No  . Sexual activity: Yes  Other Topics Concern  . Not on file  Social History Narrative  . Not on file   Social Determinants of Health   Financial Resource Strain: Not on file  Food Insecurity: No Food Insecurity  . Worried About Charity fundraiser in the Last Year: Never true  . Ran Out of Food in the Last Year: Never true  Transportation Needs: No Transportation Needs  . Lack of Transportation (Medical): No  . Lack of Transportation (Non-Medical): No  Physical Activity: Sufficiently Active  . Days of Exercise per Week: 5 days  . Minutes of Exercise per Session: 30 min  Stress: No Stress Concern Present  . Feeling of Stress : Not at all  Social Connections: Moderately Integrated  . Frequency of Communication with Friends and Family: Twice a week  . Frequency of Social  Gatherings with Friends and Family: Once a week  . Attends Religious Services: More than 4 times per year  . Active Member of Clubs or Organizations: No  . Attends Archivist Meetings: Never  . Marital Status: Married  Human resources officer Violence: Not on file    Outpatient Medications Prior to Visit  Medication Sig Dispense Refill  . amLODipine (NORVASC) 5 MG tablet TAKE ONE TABLET BY MOUTH DAILY 30 tablet 0  . brimonidine-timolol (COMBIGAN) 0.2-0.5 % ophthalmic solution Place 1 drop into both eyes every 12 (twelve) hours.    . Calcium Carbonate-Vitamin D 600-400 MG-UNIT tablet Take 2 tablets by mouth daily.    . hydrochlorothiazide (HYDRODIURIL) 25 MG tablet TAKE (1) TABLET BY MOUTH ONCE DAILY. 90 tablet 0  . latanoprost (XALATAN) 0.005 % ophthalmic solution  Place 1 drop into both eyes at bedtime.    . mirtazapine (REMERON) 7.5 MG tablet TAKE ONE TABLET BY MOUTH AT BEDTIME. 30 tablet 0  . potassium chloride (KLOR-CON) 10 MEQ tablet TAKE (1) TABLET BY MOUTH TWICE DAILY. 180 tablet 0  . ergocalciferol (VITAMIN D2) 1.25 MG (50000 UT) capsule Take 1 capsule (50,000 Units total) by mouth once a week. One capsule once weekly (Patient not taking: Reported on 05/17/2020) 12 capsule 1   No facility-administered medications prior to visit.    Allergies  Allergen Reactions  . Ace Inhibitors Cough    ROS Review of Systems  Constitutional: Negative for chills and fever.  HENT: Negative for congestion, sinus pressure, sinus pain and sore throat.   Eyes: Negative for pain and discharge.  Respiratory: Negative for cough and shortness of breath.   Cardiovascular: Negative for chest pain and palpitations.  Gastrointestinal: Negative for abdominal pain, constipation, diarrhea, nausea and vomiting.  Endocrine: Negative for polydipsia and polyuria.  Genitourinary: Negative for dysuria and hematuria.  Musculoskeletal: Negative for neck pain and neck stiffness.  Skin: Negative for rash.  Neurological: Negative for dizziness and weakness.  Psychiatric/Behavioral: Negative for agitation and behavioral problems.      Objective:    Physical Exam Vitals reviewed.  Constitutional:      General: She is not in acute distress.    Appearance: She is not diaphoretic.  HENT:     Head: Normocephalic and atraumatic.     Nose: Nose normal. No congestion.     Mouth/Throat:     Mouth: Mucous membranes are moist.     Pharynx: No posterior oropharyngeal erythema.  Eyes:     General: No scleral icterus.    Extraocular Movements: Extraocular movements intact.     Pupils: Pupils are equal, round, and reactive to light.  Cardiovascular:     Rate and Rhythm: Normal rate and regular rhythm.     Pulses: Normal pulses.     Heart sounds: Normal heart sounds. No murmur  heard.   Pulmonary:     Breath sounds: Normal breath sounds. No wheezing or rales.  Abdominal:     Palpations: Abdomen is soft.     Tenderness: There is no abdominal tenderness.  Musculoskeletal:     Cervical back: Neck supple. No tenderness.     Right lower leg: No edema.     Left lower leg: No edema.  Skin:    General: Skin is warm.     Findings: No rash.  Neurological:     General: No focal deficit present.     Mental Status: She is alert and oriented to person, place, and time.  Psychiatric:  Mood and Affect: Mood normal.        Behavior: Behavior normal.     BP 134/75 (BP Location: Right Arm, Patient Position: Sitting, Cuff Size: Normal)   Pulse 71   Temp 98.5 F (36.9 C) (Oral)   Resp 18   Ht 5\' 2"  (1.575 m)   Wt 137 lb 12.8 oz (62.5 kg)   SpO2 97%   BMI 25.20 kg/m  Wt Readings from Last 3 Encounters:  05/17/20 137 lb 12.8 oz (62.5 kg)  03/21/20 138 lb 6.4 oz (62.8 kg)  02/19/20 138 lb (62.6 kg)     There are no preventive care reminders to display for this patient.  There are no preventive care reminders to display for this patient.  Lab Results  Component Value Date   TSH 1.95 03/19/2019   Lab Results  Component Value Date   WBC 4.4 01/14/2020   HGB 13.1 01/14/2020   HCT 40.1 01/14/2020   MCV 93.0 01/14/2020   PLT 340 01/14/2020   Lab Results  Component Value Date   NA 138 01/14/2020   K 4.4 01/14/2020   CO2 31 01/14/2020   GLUCOSE 97 01/14/2020   BUN 20 01/14/2020   CREATININE 1.05 (H) 01/14/2020   BILITOT 0.7 01/14/2020   ALKPHOS 95 10/05/2019   AST 15 01/14/2020   ALT 11 01/14/2020   PROT 7.4 01/14/2020   ALBUMIN 4.5 10/05/2019   CALCIUM 9.9 01/14/2020   Lab Results  Component Value Date   CHOL 211 (H) 01/14/2020   Lab Results  Component Value Date   HDL 72 01/14/2020   Lab Results  Component Value Date   LDLCALC 119 (H) 01/14/2020   Lab Results  Component Value Date   TRIG 95 01/14/2020   Lab Results   Component Value Date   CHOLHDL 2.9 01/14/2020   Lab Results  Component Value Date   HGBA1C 5.0 08/25/2007      Assessment & Plan:   Problem List Items Addressed This Visit      Cardiovascular and Mediastinum   Essential hypertension - Primary    BP Readings from Last 1 Encounters:  05/17/20 134/75   Well-controlled with Amlodipine and HCTZ Counseled for compliance with the medications Advised DASH diet and moderate exercise/walking, at least 150 mins/week       Relevant Orders   Basic Metabolic Panel (BMET)     Musculoskeletal and Integument   Osteopenia    On Calcium and Vitamin D supplements Check Vitamin D levels        Other   GAD (generalized anxiety disorder)    Continue Remeron      Vitamin D deficiency   Relevant Orders   Vitamin D (25 hydroxy)      No orders of the defined types were placed in this encounter.   Follow-up: Return in about 4 months (around 09/16/2020).    Lindell Spar, MD

## 2020-05-17 NOTE — Patient Instructions (Addendum)
Please continue to take medications as prescribed.  Please continue to follow DASH diet and perform moderate exercise/walking as tolerated.  Please continue to take Calcium and Vitamin D supplements.

## 2020-05-17 NOTE — Assessment & Plan Note (Signed)
Continue Remeron 

## 2020-05-17 NOTE — Assessment & Plan Note (Signed)
BP Readings from Last 1 Encounters:  05/17/20 134/75   Well-controlled with Amlodipine and HCTZ Counseled for compliance with the medications Advised DASH diet and moderate exercise/walking, at least 150 mins/week

## 2020-05-18 LAB — BASIC METABOLIC PANEL
BUN/Creatinine Ratio: 18 (ref 12–28)
BUN: 19 mg/dL (ref 8–27)
CO2: 24 mmol/L (ref 20–29)
Calcium: 10.1 mg/dL (ref 8.7–10.3)
Chloride: 100 mmol/L (ref 96–106)
Creatinine, Ser: 1.07 mg/dL — ABNORMAL HIGH (ref 0.57–1.00)
Glucose: 81 mg/dL (ref 65–99)
Potassium: 4.3 mmol/L (ref 3.5–5.2)
Sodium: 140 mmol/L (ref 134–144)
eGFR: 55 mL/min/{1.73_m2} — ABNORMAL LOW (ref >59)

## 2020-05-18 LAB — VITAMIN D 25 HYDROXY (VIT D DEFICIENCY, FRACTURES): Vit D, 25-Hydroxy: 28.7 ng/mL — ABNORMAL LOW (ref 30.0–100.0)

## 2020-05-19 ENCOUNTER — Other Ambulatory Visit: Payer: Self-pay | Admitting: Family Medicine

## 2020-06-15 ENCOUNTER — Encounter (INDEPENDENT_AMBULATORY_CARE_PROVIDER_SITE_OTHER): Payer: Medicare Other | Admitting: Ophthalmology

## 2020-06-15 ENCOUNTER — Other Ambulatory Visit: Payer: Self-pay

## 2020-06-15 DIAGNOSIS — H34831 Tributary (branch) retinal vein occlusion, right eye, with macular edema: Secondary | ICD-10-CM | POA: Diagnosis not present

## 2020-06-15 DIAGNOSIS — H35033 Hypertensive retinopathy, bilateral: Secondary | ICD-10-CM | POA: Diagnosis not present

## 2020-06-15 DIAGNOSIS — I10 Essential (primary) hypertension: Secondary | ICD-10-CM

## 2020-06-15 DIAGNOSIS — H43813 Vitreous degeneration, bilateral: Secondary | ICD-10-CM | POA: Diagnosis not present

## 2020-06-21 ENCOUNTER — Other Ambulatory Visit: Payer: Self-pay | Admitting: Family Medicine

## 2020-06-29 ENCOUNTER — Other Ambulatory Visit: Payer: Self-pay | Admitting: Family Medicine

## 2020-07-19 ENCOUNTER — Other Ambulatory Visit: Payer: Self-pay | Admitting: Family Medicine

## 2020-07-20 ENCOUNTER — Encounter (INDEPENDENT_AMBULATORY_CARE_PROVIDER_SITE_OTHER): Payer: Medicare Other | Admitting: Ophthalmology

## 2020-07-20 ENCOUNTER — Other Ambulatory Visit: Payer: Self-pay

## 2020-07-20 DIAGNOSIS — H35033 Hypertensive retinopathy, bilateral: Secondary | ICD-10-CM

## 2020-07-20 DIAGNOSIS — I10 Essential (primary) hypertension: Secondary | ICD-10-CM | POA: Diagnosis not present

## 2020-07-20 DIAGNOSIS — H43813 Vitreous degeneration, bilateral: Secondary | ICD-10-CM | POA: Diagnosis not present

## 2020-07-20 DIAGNOSIS — H34831 Tributary (branch) retinal vein occlusion, right eye, with macular edema: Secondary | ICD-10-CM

## 2020-07-29 DIAGNOSIS — H348312 Tributary (branch) retinal vein occlusion, right eye, stable: Secondary | ICD-10-CM | POA: Diagnosis not present

## 2020-07-29 DIAGNOSIS — H401121 Primary open-angle glaucoma, left eye, mild stage: Secondary | ICD-10-CM | POA: Diagnosis not present

## 2020-07-29 DIAGNOSIS — H401112 Primary open-angle glaucoma, right eye, moderate stage: Secondary | ICD-10-CM | POA: Diagnosis not present

## 2020-07-29 DIAGNOSIS — H2513 Age-related nuclear cataract, bilateral: Secondary | ICD-10-CM | POA: Diagnosis not present

## 2020-08-05 ENCOUNTER — Telehealth: Payer: Self-pay | Admitting: Family Medicine

## 2020-08-05 NOTE — Telephone Encounter (Signed)
Left message for patient to call back and schedule Medicare Annual Wellness Visit (AWV) either virtually or in office.   Last AWV 07/27/19  please schedule at anytime with [facility] Nurse Health Advisor 1 or 2   This should be a 45 minute visit.

## 2020-08-12 ENCOUNTER — Other Ambulatory Visit: Payer: Self-pay | Admitting: Family Medicine

## 2020-08-16 NOTE — Progress Notes (Signed)
Subjective:   Christina Miles is a 75 y.o. female who presents for Medicare Annual (Subsequent) preventive examination.  I connected with Christina Miles today by telephone and verified that I am speaking with the correct person using two identifiers. Location patient: home Location provider: work Persons participating in the virtual visit: patient, provider.   I discussed the limitations, risks, security and privacy concerns of performing an evaluation and management service by telephone and the availability of in person appointments. I also discussed with the patient that there may be a patient responsible charge related to this service. The patient expressed understanding and verbally consented to this telephonic visit.    Interactive audio and video telecommunications were attempted between this provider and patient, however failed, due to patient having technical difficulties OR patient did not have access to video capability.  We continued and completed visit with audio only.      Review of Systems    N/A  Cardiac Risk Factors include: advanced age (>7men, >17 women);dyslipidemia;hypertension     Objective:    Today's Vitals   There is no height or weight on file to calculate BMI.  Advanced Directives 08/17/2020 10/20/2019 10/24/2017 02/20/2012  Does Patient Have a Medical Advance Directive? No No No Patient does not have advance directive;Patient would like information  Does patient want to make changes to medical advance directive? No - Patient declined - - -  Would patient like information on creating a medical advance directive? - Yes (MAU/Ambulatory/Procedural Areas - Information given) Yes (MAU/Ambulatory/Procedural Areas - Information given) Advance directive packet given  Pre-existing out of facility DNR order (yellow form or pink MOST form) - - - No    Current Medications (verified) Outpatient Encounter Medications as of 08/17/2020  Medication Sig  . amLODipine  (NORVASC) 5 MG tablet TAKE ONE TABLET BY MOUTH DAILY  . brimonidine-timolol (COMBIGAN) 0.2-0.5 % ophthalmic solution Place 1 drop into both eyes every 12 (twelve) hours.  . Calcium Carbonate-Vitamin D 600-400 MG-UNIT tablet Take 2 tablets by mouth daily.  . hydrochlorothiazide (HYDRODIURIL) 25 MG tablet TAKE (1) TABLET BY MOUTH ONCE DAILY.  Marland Kitchen latanoprost (XALATAN) 0.005 % ophthalmic solution Place 1 drop into both eyes at bedtime.  . mirtazapine (REMERON) 7.5 MG tablet TAKE ONE TABLET BY MOUTH AT BEDTIME.  Marland Kitchen potassium chloride (KLOR-CON) 10 MEQ tablet TAKE (1) TABLET BY MOUTH TWICE DAILY.  Marland Kitchen Vitamin D, Ergocalciferol, (DRISDOL) 1.25 MG (50000 UNIT) CAPS capsule TAKE ONE CAPSULE BY MOUTH ONCE A WEEK (Patient not taking: Reported on 08/17/2020)   No facility-administered encounter medications on file as of 08/17/2020.    Allergies (verified) Ace inhibitors   History: Past Medical History:  Diagnosis Date  . Depression 2014  . Hepatitis   . Hypertension   . Reduced vision 2015   right, had hemmorhage, treated by [name], injections   Past Surgical History:  Procedure Laterality Date  . ABDOMINAL HYSTERECTOMY  1985   . ? mass on ovary  . BACK SURGERY    . CHOLECYSTECTOMY    . COLONOSCOPY  02/20/2012   Procedure: COLONOSCOPY;  Surgeon: Rogene Houston, MD;  [facility];  Service: Endoscopy;  Laterality: N/A;  930  . COLONOSCOPY     Patient states that this was 3-4 years ago @[facility]   . COLONOSCOPY N/A 10/24/2017   Procedure: COLONOSCOPY;  Surgeon: Rogene Houston, MD;  Location: [facility];  Service: Endoscopy;  Laterality: N/A;  1300  . GLAUCOMA SURGERY  05/2017  . POLYPECTOMY  10/24/2017   Procedure: POLYPECTOMY;  Surgeon: Rogene Houston, MD;  Location: [facility];  Service: Endoscopy;;  colon  . SPINE SURGERY  1998 approx   [place]   Family History  Problem Relation Age of Onset  . Colon cancer Father   . Dementia Mother   . Diabetes Brother   . Hypertension  Brother   . Hypertension Brother    Social History   Socioeconomic History  . Marital status: Married    Spouse name: Not on file  . Number of children: Not on file  . Years of education: Not on file  . Highest education level: Not on file  Occupational History  . Not on file  Tobacco Use  . Smoking status: Former Smoker    Packs/day: 1.00    Years: 10.00    Pack years: 10.00    Types: Cigarettes    Quit date: 2000    Years since quitting: 22.4  . Smokeless tobacco: Never Used  Vaping Use  . Vaping Use: Never used  Substance and Sexual Activity  . Alcohol use: No    Alcohol/week: 0.0 standard drinks  . Drug use: No  . Sexual activity: Yes  Other Topics Concern  . Not on file  Social History Narrative  . Not on file   Social Determinants of Health   Financial Resource Strain: Low Risk   . Difficulty of Paying Living Expenses: Not hard at all  Food Insecurity: No Food Insecurity  . Worried About Charity fundraiser in the Last Year: Never true  . Ran Out of Food in the Last Year: Never true  Transportation Needs: No Transportation Needs  . Lack of Transportation (Medical): No  . Lack of Transportation (Non-Medical): No  Physical Activity: Insufficiently Active  . Days of Exercise per Week: 3 days  . Minutes of Exercise per Session: 30 min  Stress: No Stress Concern Present  . Feeling of Stress : Not at all  Social Connections: Moderately Integrated  . Frequency of Communication with Friends and Family: More than three times a week  . Frequency of Social Gatherings with Friends and Family: More than three times a week  . Attends Religious Services: More than 4 times per year  . Active Member of Clubs or Organizations: No  . Attends Archivist Meetings: Never  . Marital Status: Married    Tobacco Counseling Counseling given: Not Answered   Clinical Intake:  Pre-visit preparation completed: Yes  Pain : No/denies pain     Nutritional Risks:  None Diabetes: No  How often do you need to have someone help you when you read instructions, pamphlets, or other written materials from your doctor or pharmacy?: 1 - Never  Diabetic?No   Interpreter Needed?: No  Information entered by :: [place] of Daily Living In your present state of health, do you have any difficulty performing the following activities: 08/17/2020  Hearing? N  Vision? N  Difficulty concentrating or making decisions? N  Walking or climbing stairs? N  Dressing or bathing? N  Doing errands, shopping? N  Preparing Food and eating ? N  Using the Toilet? N  In the past six months, have you accidently leaked urine? Y  Do you have problems with loss of bowel control? N  Managing your Medications? N  Managing your Finances? N  Housekeeping or managing your Housekeeping? N  Some recent data might be hidden  Patient Care Team: Fayrene Helper, MD as PCP - General Minus Breeding, MD as PCP - Cardiology (Cardiology) Hayden Pedro, MD as Consulting Physician (Ophthalmology)  Indicate any recent Medical Services you may have received from other than [facility] providers in the past year (date may be approximate).     Assessment:   This is a routine wellness examination for [place].  Hearing/Vision screen  Hearing Screening   125Hz  250Hz  500Hz  1000Hz  2000Hz  3000Hz  4000Hz  6000Hz  8000Hz   Right ear:           Left ear:           Vision Screening Comments: Patient states she is currently seeing her eye doctor every 3 months. Currently has glaucoma and wears glasses  Dietary issues and exercise activities discussed: Current Exercise Habits: Home exercise routine, Type of exercise: walking, Time (Minutes): 30, Frequency (Times/Week): 3, Weekly Exercise (Minutes/Week): 90, Exercise limited by: None identified  Goals Addressed            This Visit's Progress   . DIET - INCREASE WATER INTAKE   On track    Wants to drink more water        Depression Screen PHQ 2/9 Scores 08/17/2020 05/17/2020 01/18/2020 01/04/2020 10/06/2019 07/27/2019 05/19/2019  PHQ - 2 Score 0 0 0 0 0 0 1  PHQ- 9 Score - 0 0 0 - - 2    Fall Risk Fall Risk  08/17/2020 05/17/2020 01/18/2020 01/04/2020 10/06/2019  Falls in the past year? 0 0 0 0 0  Number falls in past yr: 0 0 0 0 -  Injury with Fall? 0 0 0 0 -  Risk for fall due to : No Fall Risks No Fall Risks No Fall Risks - -  Follow up Falls evaluation completed;Falls prevention discussed Falls evaluation completed Falls evaluation completed - -    FALL RISK PREVENTION PERTAINING TO THE HOME:  Any stairs in or around the home? Yes  If so, are there any without handrails? No  Home free of loose throw rugs in walkways, pet beds, electrical cords, etc? Yes  Adequate lighting in your home to reduce risk of falls? Yes   ASSISTIVE DEVICES UTILIZED TO PREVENT FALLS:  Life alert? No  Use of a cane, walker or w/c? No  Grab bars in the bathroom? Yes  Shower chair or bench in shower? Yes  Elevated toilet seat or a handicapped toilet? Yes    Cognitive Function:   Normal cognitive status assessed by direct observation by this Nurse Health Advisor. No abnormalities found.    6CIT Screen 07/27/2019 07/17/2018  What Year? 0 points 0 points  What month? 0 points 0 points  What time? 0 points 0 points  Count back from 20 0 points 0 points  Months in reverse 0 points 0 points  Repeat phrase 0 points 0 points  Total Score 0 0    Immunizations Immunization History  Administered Date(s) Administered  . Fluad Quad(high Dose 65+) 12/23/2018, 01/04/2020  . Influenza Split 01/23/2012  . Influenza Whole 12/18/2005  . Influenza,inj,Quad PF,6+ Mos 12/18/2012, 01/26/2014, 03/15/2015, 01/26/2016, 01/03/2017, 01/07/2018  . PFIZER(Purple Top)SARS-COV-2 Vaccination 05/09/2019, 06/01/2019, 12/15/2019  . Pneumococcal Conjugate-13 10/28/2013  . Pneumococcal Polysaccharide-23 12/18/2012  . Td 08/09/2003  . Tdap 10/05/2014  .  Zoster, Live 01/23/2012    TDAP status: Up to date  Flu Vaccine status: Up to date  Pneumococcal vaccine status: Up to date  Covid-19 vaccine status: Completed vaccines  Qualifies for Shingles Vaccine?  Yes   Zostavax completed Yes   Shingrix Completed?: No.    Education has been provided regarding the importance of this vaccine. Patient has been advised to call insurance company to determine out of pocket expense if they have not yet received this vaccine. Advised may also receive vaccine at local pharmacy or Health Dept. Verbalized acceptance and understanding.  Screening Tests Health Maintenance  Topic Date Due  . Zoster Vaccines- Shingrix (1 of 2) Never done  . INFLUENZA VACCINE  10/17/2020  . MAMMOGRAM  01/31/2021  . COLONOSCOPY (Pts 45-18yrs Insurance coverage will need to be confirmed)  10/25/2022  . TETANUS/TDAP  10/04/2024  . DEXA SCAN  Completed  . COVID-19 Vaccine  Completed  . Hepatitis C Screening  Completed  . PNA vac Low Risk Adult  Completed  . HPV VACCINES  Aged Out    Health Maintenance  Health Maintenance Due  Topic Date Due  . Zoster Vaccines- Shingrix (1 of 2) Never done    Colorectal cancer screening: Type of screening: Colonoscopy. Completed 10/24/2017. Repeat every 5 years  Mammogram status: Completed 02/01/2020. Repeat every year  Bone Density status: Completed 02/01/2020. Results reflect: Bone density results: OSTEOPENIA. Repeat every 5 years.  Lung Cancer Screening: (Low Dose CT Chest recommended if Age 65-80 years, 30 pack-year currently smoking OR have quit w/in 15years.) does not qualify.   Lung Cancer Screening Referral: N/A   Additional Screening:  Hepatitis C Screening: does qualify; Completed 12/15/2019   Vision Screening: Recommended annual ophthalmology exams for early detection of glaucoma and other disorders of the eye. Is the patient up to date with their annual eye exam?  Yes  Who is the provider or what is the name of the  office in which the patient attends annual eye exams? Dr. Warden Fillers, and Dr. Zigmund Daniel  If pt is not established with a provider, would they like to be referred to a provider to establish care? No .   Dental Screening: Recommended annual dental exams for proper oral hygiene  Community Resource Referral / Chronic Care Management: CRR required this visit?  No   CCM required this visit?  No      Plan:     I have personally reviewed and noted the following in the patient's chart:   . Medical and social history . Use of alcohol, tobacco or illicit drugs  . Current medications and supplements including opioid prescriptions.  . Functional ability and status . Nutritional status . Physical activity . Advanced directives . List of other physicians . Hospitalizations, surgeries, and ER visits in previous 12 months . Vitals . Screenings to include cognitive, depression, and falls . Referrals and appointments  In addition, I have reviewed and discussed with patient certain preventive protocols, quality metrics, and best practice recommendations. A written personalized care plan for preventive services as well as general preventive health recommendations were provided to patient.     Ofilia Neas, LPN   05/23/6438   Nurse Notes: None

## 2020-08-17 ENCOUNTER — Ambulatory Visit (INDEPENDENT_AMBULATORY_CARE_PROVIDER_SITE_OTHER): Payer: Medicare Other

## 2020-08-17 ENCOUNTER — Other Ambulatory Visit: Payer: Self-pay

## 2020-08-17 DIAGNOSIS — Z0000 Encounter for general adult medical examination without abnormal findings: Secondary | ICD-10-CM

## 2020-08-17 NOTE — Patient Instructions (Signed)
Christina Miles , Thank you for taking time to come for your Medicare Wellness Visit. I appreciate your ongoing commitment to your health goals. Please review the following plan we discussed and let me know if I can assist you in the future.   Screening recommendations/referrals: Colonoscopy: Up to date, next due 10/26/2022 Mammogram: Up to date, next due 01/31/2021 Bone Density: Up to date, next due 01/31/2025 Recommended yearly ophthalmology/optometry visit for glaucoma screening and checkup Recommended yearly dental visit for hygiene and checkup  Vaccinations: Influenza vaccine: Up to date, next due fall 2022  Pneumococcal vaccine: Completed series  Tdap vaccine: Up to date, next due 10/04/2024 Shingles vaccine: Currently due for Shingrix, if you would like to receive we recommend that you do so at your local pharmacy     Advanced directives: Advance directive discussed with you today. Even though you declined this today please call our office should you change your mind and we can give you the proper paperwork for you to fill out.   Conditions/risks identified: None  Next appointment: 08/23/2021 @ 1:00 PM with [place] 65 Years and Older, Female Preventive care refers to lifestyle choices and visits with your health care provider that can promote health and wellness. What does preventive care include?  A yearly physical exam. This is also called an annual well check.  Dental exams once or twice a year.  Routine eye exams. Ask your health care provider how often you should have your eyes checked.  Personal lifestyle choices, including:  Daily care of your teeth and gums.  Regular physical activity.  Eating a healthy diet.  Avoiding tobacco and drug use.  Limiting alcohol use.  Practicing safe sex.  Taking low-dose aspirin every day.  Taking vitamin and mineral supplements as recommended by your health care provider. What happens during  an annual well check? The services and screenings done by your health care provider during your annual well check will depend on your age, overall health, lifestyle risk factors, and family history of disease. Counseling  Your health care provider may ask you questions about your:  Alcohol use.  Tobacco use.  Drug use.  Emotional well-being.  Home and relationship well-being.  Sexual activity.  Eating habits.  History of falls.  Memory and ability to understand (cognition).  Work and work Statistician.  Reproductive health. Screening  You may have the following tests or measurements:  Height, weight, and BMI.  Blood pressure.  Lipid and cholesterol levels. These may be checked every 5 years, or more frequently if you are over 24 years old.  Skin check.  Lung cancer screening. You may have this screening every year starting at age 3 if you have a 30-pack-year history of smoking and currently smoke or have quit within the past 15 years.  Fecal occult blood test (FOBT) of the stool. You may have this test every year starting at age 42.  Flexible sigmoidoscopy or colonoscopy. You may have a sigmoidoscopy every 5 years or a colonoscopy every 10 years starting at age 55.  Hepatitis C blood test.  Hepatitis B blood test.  Sexually transmitted disease (STD) testing.  Diabetes screening. This is done by checking your blood sugar (glucose) after you have not eaten for a while (fasting). You may have this done every 1-3 years.  Bone density scan. This is done to screen for osteoporosis. You may have this done starting at age 23.  Mammogram. This may be done every  1-2 years. Talk to your health care provider about how often you should have regular mammograms. Talk with your health care provider about your test results, treatment options, and if necessary, the need for more tests. Vaccines  Your health care provider may recommend certain vaccines, such as:  Influenza  vaccine. This is recommended every year.  Tetanus, diphtheria, and acellular pertussis (Tdap, Td) vaccine. You may need a Td booster every 10 years.  Zoster vaccine. You may need this after age 15.  Pneumococcal 13-valent conjugate (PCV13) vaccine. One dose is recommended after age 91.  Pneumococcal polysaccharide (PPSV23) vaccine. One dose is recommended after age 76. Talk to your health care provider about which screenings and vaccines you need and how often you need them. This information is not intended to replace advice given to you by your health care provider. Make sure you discuss any questions you have with your health care provider. Document Released: 04/01/2015 Document Revised: 11/23/2015 Document Reviewed: 01/04/2015 Elsevier Interactive Patient Education  2017 [place] Prevention in the Home Falls can cause injuries. They can happen to people of all ages. There are many things you can do to make your home safe and to help prevent falls. What can I do on the outside of my home?  Regularly fix the edges of walkways and driveways and fix any cracks.  Remove anything that might make you trip as you walk through a door, such as a raised step or threshold.  Trim any bushes or trees on the path to your home.  Use bright outdoor lighting.  Clear any walking paths of anything that might make someone trip, such as rocks or tools.  Regularly check to see if handrails are loose or broken. Make sure that both sides of any steps have handrails.  Any raised decks and porches should have guardrails on the edges.  Have any leaves, snow, or ice cleared regularly.  Use sand or salt on walking paths during winter.  Clean up any spills in your garage right away. This includes oil or grease spills. What can I do in the bathroom?  Use night lights.  Install grab bars by the toilet and in the tub and shower. Do not use towel bars as grab bars.  Use non-skid mats or decals  in the tub or shower.  If you need to sit down in the shower, use a plastic, non-slip stool.  Keep the floor dry. Clean up any water that spills on the floor as soon as it happens.  Remove soap buildup in the tub or shower regularly.  Attach bath mats securely with double-sided non-slip rug tape.  Do not have throw rugs and other things on the floor that can make you trip. What can I do in the bedroom?  Use night lights.  Make sure that you have a light by your bed that is easy to reach.  Do not use any sheets or blankets that are too big for your bed. They should not hang down onto the floor.  Have a firm chair that has side arms. You can use this for support while you get dressed.  Do not have throw rugs and other things on the floor that can make you trip. What can I do in the kitchen?  Clean up any spills right away.  Avoid walking on wet floors.  Keep items that you use a lot in easy-to-reach places.  If you need to reach something above you, use a strong  step stool that has a grab bar.  Keep electrical cords out of the way.  Do not use floor polish or wax that makes floors slippery. If you must use wax, use non-skid floor wax.  Do not have throw rugs and other things on the floor that can make you trip. What can I do with my stairs?  Do not leave any items on the stairs.  Make sure that there are handrails on both sides of the stairs and use them. Fix handrails that are broken or loose. Make sure that handrails are as long as the stairways.  Check any carpeting to make sure that it is firmly attached to the stairs. Fix any carpet that is loose or worn.  Avoid having throw rugs at the top or bottom of the stairs. If you do have throw rugs, attach them to the floor with carpet tape.  Make sure that you have a light switch at the top of the stairs and the bottom of the stairs. If you do not have them, ask someone to add them for you. What else can I do to help  prevent falls?  Wear shoes that:  Do not have high heels.  Have rubber bottoms.  Are comfortable and fit you well.  Are closed at the toe. Do not wear sandals.  If you use a stepladder:  Make sure that it is fully opened. Do not climb a closed stepladder.  Make sure that both sides of the stepladder are locked into place.  Ask someone to hold it for you, if possible.  Clearly mark and make sure that you can see:  Any grab bars or handrails.  First and last steps.  Where the edge of each step is.  Use tools that help you move around (mobility aids) if they are needed. These include:  Canes.  Walkers.  Scooters.  Crutches.  Turn on the lights when you go into a dark area. Replace any light bulbs as soon as they burn out.  Set up your furniture so you have a clear path. Avoid moving your furniture around.  If any of your floors are uneven, fix them.  If there are any pets around you, be aware of where they are.  Review your medicines with your doctor. Some medicines can make you feel dizzy. This can increase your chance of falling. Ask your doctor what other things that you can do to help prevent falls. This information is not intended to replace advice given to you by your health care provider. Make sure you discuss any questions you have with your health care provider. Document Released: 12/30/2008 Document Revised: 08/11/2015 Document Reviewed: 04/09/2014 Elsevier Interactive Patient Education  2017 [employer].

## 2020-08-24 ENCOUNTER — Other Ambulatory Visit: Payer: Self-pay

## 2020-08-24 ENCOUNTER — Other Ambulatory Visit: Payer: Self-pay | Admitting: Family Medicine

## 2020-08-24 ENCOUNTER — Encounter (INDEPENDENT_AMBULATORY_CARE_PROVIDER_SITE_OTHER): Payer: Medicare Other | Admitting: Ophthalmology

## 2020-08-24 DIAGNOSIS — H2513 Age-related nuclear cataract, bilateral: Secondary | ICD-10-CM

## 2020-08-24 DIAGNOSIS — H34811 Central retinal vein occlusion, right eye, with macular edema: Secondary | ICD-10-CM | POA: Diagnosis not present

## 2020-08-24 DIAGNOSIS — I10 Essential (primary) hypertension: Secondary | ICD-10-CM | POA: Diagnosis not present

## 2020-08-24 DIAGNOSIS — H35033 Hypertensive retinopathy, bilateral: Secondary | ICD-10-CM

## 2020-08-24 DIAGNOSIS — H43813 Vitreous degeneration, bilateral: Secondary | ICD-10-CM

## 2020-08-24 DIAGNOSIS — H34831 Tributary (branch) retinal vein occlusion, right eye, with macular edema: Secondary | ICD-10-CM

## 2020-09-21 ENCOUNTER — Ambulatory Visit (INDEPENDENT_AMBULATORY_CARE_PROVIDER_SITE_OTHER): Payer: Medicare Other | Admitting: Family Medicine

## 2020-09-21 ENCOUNTER — Other Ambulatory Visit (HOSPITAL_COMMUNITY): Payer: Self-pay | Admitting: Family Medicine

## 2020-09-21 ENCOUNTER — Other Ambulatory Visit: Payer: Self-pay | Admitting: Family Medicine

## 2020-09-21 ENCOUNTER — Encounter: Payer: Self-pay | Admitting: Family Medicine

## 2020-09-21 ENCOUNTER — Other Ambulatory Visit: Payer: Self-pay

## 2020-09-21 VITALS — BP 130/80 | HR 76 | Temp 98.8°F | Resp 20 | Ht 62.0 in | Wt 137.0 lb

## 2020-09-21 DIAGNOSIS — F324 Major depressive disorder, single episode, in partial remission: Secondary | ICD-10-CM

## 2020-09-21 DIAGNOSIS — M5410 Radiculopathy, site unspecified: Secondary | ICD-10-CM

## 2020-09-21 DIAGNOSIS — R772 Abnormality of alphafetoprotein: Secondary | ICD-10-CM

## 2020-09-21 DIAGNOSIS — F5104 Psychophysiologic insomnia: Secondary | ICD-10-CM

## 2020-09-21 DIAGNOSIS — F322 Major depressive disorder, single episode, severe without psychotic features: Secondary | ICD-10-CM

## 2020-09-21 DIAGNOSIS — E7849 Other hyperlipidemia: Secondary | ICD-10-CM

## 2020-09-21 DIAGNOSIS — R002 Palpitations: Secondary | ICD-10-CM | POA: Diagnosis not present

## 2020-09-21 DIAGNOSIS — I10 Essential (primary) hypertension: Secondary | ICD-10-CM | POA: Diagnosis not present

## 2020-09-21 DIAGNOSIS — M8580 Other specified disorders of bone density and structure, unspecified site: Secondary | ICD-10-CM

## 2020-09-21 DIAGNOSIS — Z1231 Encounter for screening mammogram for malignant neoplasm of breast: Secondary | ICD-10-CM

## 2020-09-21 NOTE — Patient Instructions (Addendum)
Annual exam after 01/04/2021, call if you need me sooner  Please sched mammogram at checkout  Labs today, lipid, cmp and EGFr, tSH, AFP tumor marker, result will be sent to Dr Salley Slaughter with follow arranged  It is important that you exercise regularly at least 30 minutes 5 times a week. If you develop chest pain, have severe difficulty breathing, or feel very tired, stop exercising immediately and seek medical attention    Think about what you will eat, plan ahead. Choose " clean, green, fresh or frozen" over canned, processed or packaged foods which are more sugary, salty and fatty. 70 to 75% of food eaten should be vegetables and fruit. Three meals at set times with snacks allowed between meals, but they must be fruit or vegetables. Aim to eat over a 12 hour period , example 7 am to 7 pm, and STOP after  your last meal of the day. Drink water,generally about 64 ounces per day, no other drink is as healthy. Fruit juice is best enjoyed in a healthy way, by EATING the fruit.  Thanks for choosing [facility], we consider it a privelige to serve you.

## 2020-09-21 NOTE — Progress Notes (Signed)
   Christina Miles     MRN: [id number]      DOB: 10/16/1945   HPI Christina Miles is here for follow up and re-evaluation of chronic medical conditions, medication management and review of any available recent lab and radiology data.  Preventive health is updated, specifically  Cancer screening and Immunization.   Questions or concerns regarding consultations or procedures which the PT has had in the interim are  addressed.Has regular ophthalmology visits, vision stable The PT denies any adverse reactions to current medications since the last visit.  There are no new concerns.  There are no specific complaints   ROS Denies recent fever or chills. Denies sinus pressure, nasal congestion, ear pain or sore throat. Denies chest congestion, productive cough or wheezing. Denies chest pains, palpitations and leg swelling Denies abdominal pain, nausea, vomiting,diarrhea or constipation.   Denies dysuria, frequency, hesitancy or incontinence. 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 Denies depression, anxiety or insomnia. Denies skin break down or rash.   PE  BP 130/80   Pulse 76   Temp 98.8 F (37.1 C)   Resp 20   Ht 5\' 2"  (1.575 m)   Wt 137 lb (62.1 kg)   SpO2 96%   BMI 25.06 kg/m   Patient alert and oriented and in no cardiopulmonary distress.  HEENT: No facial asymmetry, EOMI,     Neck supple .  Chest: Clear to auscultation bilaterally.  CVS: S1, S2 no murmurs, no S3.Regular rate.  ABD: Soft non tender.   Ext: No edema  MS: Adequate ROM spine, shoulders, hips and knees.  Skin: Intact, no ulcerations or rash noted.  Psych: Good eye contact, normal affect. Memory intact not anxious or depressed appearing.  CNS: CN 2-12 intact, power,  normal throughout.no focal deficits noted.   Assessment & Plan  Depression, major, single episode, in partial remission  (HCC) Controlled, no change in medication   Back pain with right-sided radiculopathy No recent severe flair, intermittent pain esp with gardening  Essential hypertension Controlled, no change in medication DASH diet and commitment to daily physical activity for a minimum of 30 minutes discussed and encouraged, as a part of hypertension management. The importance of attaining a healthy weight is also discussed.  BP/Weight 09/21/2020 08/17/2020 05/17/2020 03/21/2020 02/19/2020 01/18/2020 87/86/7672  Systolic BP 094 - 709 628 366 294 765  Diastolic BP 80 - 75 64 80 78 89  Wt. (Lbs) 137 - 137.8 138.4 138 134 133  BMI 25.06 - 25.2 24.91 25.24 24.51 24.33       Elevated AFP Slight increase inpast year, GI to follow   Hyperlipidemia Hyperlipidemia:Low fat diet discussed and encouraged.   Lipid Panel  Lab Results  Component Value Date   CHOL 230 (H) 09/21/2020   HDL 76 09/21/2020   LDLCALC 141 (H) 09/21/2020   TRIG 73 09/21/2020   CHOLHDL 3.0 09/21/2020   Needs to reduce fat in diet    Insomnia Sleep hygiene reviewed and written information offered also. Prescription sent for  medication needed.

## 2020-09-22 LAB — LIPID PANEL
Chol/HDL Ratio: 3 ratio (ref 0.0–4.4)
Cholesterol, Total: 230 mg/dL — ABNORMAL HIGH (ref 100–199)
HDL: 76 mg/dL (ref >39)
LDL Chol Calc (NIH): 141 mg/dL — ABNORMAL HIGH (ref 0–99)
Triglycerides: 73 mg/dL (ref 0–149)
VLDL Cholesterol Cal: 13 mg/dL (ref 5–40)

## 2020-09-22 LAB — CMP14+EGFR
ALT: 9 IU/L (ref 0–32)
AST: 12 IU/L (ref 0–40)
Albumin/Globulin Ratio: 1.7 (ref 1.2–2.2)
Albumin: 4.7 g/dL (ref 3.7–4.7)
Alkaline Phosphatase: 91 IU/L (ref 44–121)
BUN/Creatinine Ratio: 18 (ref 12–28)
BUN: 17 mg/dL (ref 8–27)
Bilirubin Total: 0.9 mg/dL (ref 0.0–1.2)
CO2: 22 mmol/L (ref 20–29)
Calcium: 11.1 mg/dL — ABNORMAL HIGH (ref 8.7–10.3)
Chloride: 100 mmol/L (ref 96–106)
Creatinine, Ser: 0.93 mg/dL (ref 0.57–1.00)
Globulin, Total: 2.8 g/dL (ref 1.5–4.5)
Glucose: 85 mg/dL (ref 65–99)
Potassium: 4.7 mmol/L (ref 3.5–5.2)
Sodium: 138 mmol/L (ref 134–144)
Total Protein: 7.5 g/dL (ref 6.0–8.5)
eGFR: 65 mL/min/{1.73_m2} (ref >59)

## 2020-09-22 LAB — AFP TUMOR MARKER: AFP, Serum, Tumor Marker: 15.3 ng/mL — ABNORMAL HIGH (ref 0.0–9.2)

## 2020-09-22 LAB — TSH: TSH: 0.891 u[IU]/mL (ref 0.450–4.500)

## 2020-09-24 NOTE — Assessment & Plan Note (Signed)
Slight increase inpast year, GI to follow

## 2020-09-24 NOTE — Assessment & Plan Note (Signed)
Controlled, no change in medication  

## 2020-09-24 NOTE — Assessment & Plan Note (Signed)
Sleep hygiene reviewed and written information offered also. Prescription sent for  medication needed.  

## 2020-09-24 NOTE — Assessment & Plan Note (Signed)
Controlled, no change in medication DASH diet and commitment to daily physical activity for a minimum of 30 minutes discussed and encouraged, as a part of hypertension management. The importance of attaining a healthy weight is also discussed.  BP/Weight 09/21/2020 08/17/2020 05/17/2020 03/21/2020 02/19/2020 01/18/2020 17/40/8144  Systolic BP 818 - 563 149 702 637 858  Diastolic BP 80 - 75 64 80 78 89  Wt. (Lbs) 137 - 137.8 138.4 138 134 133  BMI 25.06 - 25.2 24.91 25.24 24.51 24.33

## 2020-09-24 NOTE — Assessment & Plan Note (Signed)
No recent severe flair, intermittent pain esp with gardening

## 2020-09-24 NOTE — Assessment & Plan Note (Signed)
Hyperlipidemia:Low fat diet discussed and encouraged.   Lipid Panel  Lab Results  Component Value Date   CHOL 230 (H) 09/21/2020   HDL 76 09/21/2020   LDLCALC 141 (H) 09/21/2020   TRIG 73 09/21/2020   CHOLHDL 3.0 09/21/2020   Needs to reduce fat in diet

## 2020-09-26 ENCOUNTER — Encounter (INDEPENDENT_AMBULATORY_CARE_PROVIDER_SITE_OTHER): Payer: Self-pay | Admitting: *Deleted

## 2020-09-27 NOTE — Progress Notes (Signed)
Pt informed

## 2020-09-28 ENCOUNTER — Other Ambulatory Visit: Payer: Self-pay

## 2020-09-28 ENCOUNTER — Encounter (INDEPENDENT_AMBULATORY_CARE_PROVIDER_SITE_OTHER): Payer: Medicare Other | Admitting: Ophthalmology

## 2020-09-28 DIAGNOSIS — H34811 Central retinal vein occlusion, right eye, with macular edema: Secondary | ICD-10-CM | POA: Diagnosis not present

## 2020-09-28 DIAGNOSIS — H43813 Vitreous degeneration, bilateral: Secondary | ICD-10-CM

## 2020-09-28 DIAGNOSIS — H34831 Tributary (branch) retinal vein occlusion, right eye, with macular edema: Secondary | ICD-10-CM

## 2020-09-28 DIAGNOSIS — I10 Essential (primary) hypertension: Secondary | ICD-10-CM

## 2020-09-28 DIAGNOSIS — H35033 Hypertensive retinopathy, bilateral: Secondary | ICD-10-CM | POA: Diagnosis not present

## 2020-09-28 DIAGNOSIS — H2513 Age-related nuclear cataract, bilateral: Secondary | ICD-10-CM

## 2020-10-01 ENCOUNTER — Other Ambulatory Visit: Payer: Self-pay | Admitting: Family Medicine

## 2020-10-26 ENCOUNTER — Encounter (INDEPENDENT_AMBULATORY_CARE_PROVIDER_SITE_OTHER): Payer: Medicare Other | Admitting: Ophthalmology

## 2020-10-26 ENCOUNTER — Other Ambulatory Visit: Payer: Self-pay

## 2020-10-26 DIAGNOSIS — H34811 Central retinal vein occlusion, right eye, with macular edema: Secondary | ICD-10-CM | POA: Diagnosis not present

## 2020-10-26 DIAGNOSIS — I10 Essential (primary) hypertension: Secondary | ICD-10-CM

## 2020-10-26 DIAGNOSIS — H35033 Hypertensive retinopathy, bilateral: Secondary | ICD-10-CM | POA: Diagnosis not present

## 2020-10-26 DIAGNOSIS — H43813 Vitreous degeneration, bilateral: Secondary | ICD-10-CM

## 2020-10-31 ENCOUNTER — Other Ambulatory Visit: Payer: Self-pay

## 2020-10-31 ENCOUNTER — Ambulatory Visit (INDEPENDENT_AMBULATORY_CARE_PROVIDER_SITE_OTHER): Payer: Medicare Other | Admitting: Gastroenterology

## 2020-10-31 ENCOUNTER — Encounter (INDEPENDENT_AMBULATORY_CARE_PROVIDER_SITE_OTHER): Payer: Self-pay | Admitting: Gastroenterology

## 2020-10-31 VITALS — BP 139/74 | HR 75 | Temp 99.5°F | Ht 62.0 in | Wt 135.0 lb

## 2020-10-31 DIAGNOSIS — R772 Abnormality of alphafetoprotein: Secondary | ICD-10-CM

## 2020-10-31 NOTE — Progress Notes (Signed)
Referring Provider: Fayrene Helper, MD Primary Care Physician:  Fayrene Helper, MD Primary GI Physician: Rehman  Chief Complaint  Patient presents with   elevated AFP     Pt following up on elevated AFP. States she has a BM every day. Appetite is good. Pain in groin area on right side for about 2 months. States pcp thought it was sciatica.    HPI:   Christina Miles is a 75 y.o. female presenting today with a history of hepatitis C, HTN, and depression. Patient here for follow up of elevated AFP, s/p HCV. Last clinic visit was July 2021.   Hepatitis C: HCV treated in 2017 with Epclusa. Previous elevation of AFP (17) 2 years ago, most recent labs in July 2022, AFP remains elevated at 15.3, LFTs WNL, calcium elevated to 11.1. last abdominal imaging July 2020 with RUQ [place] that was normal. HCV last checked in 2019 showed SVR. It was Recommended that patient have MRI abdominal imagining previously, however, insurance would not approve it.  She reports pain to RUQ sometimes when she bends over, but denies any other abdominal pain. Denies nausea or vomiting or unintentional weight loss  Reports some mild constipation occasionally but Usually has 1 bm per day. Not taking anything for constipation. States that she ate some watermelon a few days ago that helped.   Last Colonoscopy:(2019) small polyp at splenic flexure and distal sigmoid colon, external hemorrhoids. (Tubular adenoma)  Last Endoscopy: n/a  Family history of CRC, father.   Recommendations:  Repeat surveillance colonoscopy 2024  Past Medical History:  Diagnosis Date   Depression 2014   Hepatitis    Hypertension    Reduced vision 2015   right, had hemmorhage, treated by [name], injections    Past Surgical History:  Procedure Laterality Date   ABDOMINAL HYSTERECTOMY  1985   . ? mass on ovary   BACK SURGERY     CHOLECYSTECTOMY     COLONOSCOPY  02/20/2012   Procedure: COLONOSCOPY;  Surgeon: Rogene Houston,  MD;  [facility]: [facility];  Service: Endoscopy;  Laterality: N/A;  930   COLONOSCOPY     Patient states that this was 3-4 years ago [facility]    COLONOSCOPY N/A 10/24/2017   Procedure: COLONOSCOPY;  Surgeon: Rogene Houston, MD;  Location: [facility];  Service: Endoscopy;  Laterality: N/A;  1300   GLAUCOMA SURGERY     05/2017   POLYPECTOMY  10/24/2017   Procedure: POLYPECTOMY;  Surgeon: Rogene Houston, MD;  Location: [facility];  Service: Endoscopy;;  colon   SPINE SURGERY  1998 approx   [place]    Current Outpatient Medications  Medication Sig Dispense Refill   amLODipine (NORVASC) 5 MG tablet TAKE ONE TABLET BY MOUTH DAILY 30 tablet 0   brimonidine-timolol (COMBIGAN) 0.2-0.5 % ophthalmic solution Place 1 drop into both eyes every 12 (twelve) hours.     Calcium Carbonate-Vitamin D 600-400 MG-UNIT tablet Take 2 tablets by mouth daily.     hydrochlorothiazide (HYDRODIURIL) 25 MG tablet TAKE (1) TABLET BY MOUTH ONCE DAILY. 90 tablet 0   latanoprost (XALATAN) 0.005 % ophthalmic solution Place 1 drop into both eyes at bedtime.     mirtazapine (REMERON) 7.5 MG tablet TAKE ONE TABLET BY MOUTH AT BEDTIME. 30 tablet 5   potassium chloride (KLOR-CON) 10 MEQ tablet TAKE (1) TABLET BY MOUTH TWICE DAILY. 180 tablet 0   No current facility-administered medications for this visit.    Allergies as of  10/31/2020 - Review Complete 10/31/2020  Allergen Reaction Noted   Ace inhibitors Cough 10/23/2011    Family History  Problem Relation Age of Onset   Colon cancer Father    Dementia Mother    Diabetes Brother    Hypertension Brother    Hypertension Brother     Social History   Socioeconomic History   Marital status: Married    Spouse name: Not on file   Number of children: Not on file   Years of education: Not on file   Highest education level: Not on file  Occupational History   Not on file  Tobacco Use   Smoking status: Former    Packs/day: 1.00    Years: 10.00    Pack years:  10.00    Types: Cigarettes    Quit date: 2000    Years since quitting: 22.6   Smokeless tobacco: Never  Vaping Use   Vaping Use: Never used  Substance and Sexual Activity   Alcohol use: No    Alcohol/week: 0.0 standard drinks   Drug use: No   Sexual activity: Yes  Other Topics Concern   Not on file  Social History Narrative   Not on file   Social Determinants of Health   Financial Resource Strain: Low Risk    Difficulty of Paying Living Expenses: Not hard at all  Food Insecurity: No Food Insecurity   Worried About Charity fundraiser in the Last Year: Never true   Ran Out of Food in the Last Year: Never true  Transportation Needs: No Transportation Needs   Lack of Transportation (Medical): No   Lack of Transportation (Non-Medical): No  Physical Activity: Insufficiently Active   Days of Exercise per Week: 3 days   Minutes of Exercise per Session: 30 min  Stress: No Stress Concern Present   Feeling of Stress : Not at all  Social Connections: Moderately Integrated   Frequency of Communication with Friends and Family: More than three times a week   Frequency of Social Gatherings with Friends and Family: More than three times a week   Attends Religious Services: More than 4 times per year   Active Member of [employer] or Organizations: No   Attends Archivist Meetings: Never   Marital Status: Married   Review of Systems: Gen: Denies fever, chills, anorexia. Denies fatigue, weakness, weight loss.  CV: Denies chest pain, palpitations, syncope, peripheral edema, and claudication. Resp: Denies dyspnea at rest, cough, wheezing, coughing up blood, and pleurisy. GI: Denies vomiting blood, jaundice, and fecal incontinence. Denies dysphagia or odynophagia. Derm: Denies rash, itching, dry skin Psych: Denies depression, anxiety, memory loss, confusion. No homicidal or suicidal ideation.  Heme: Denies bruising, bleeding, and enlarged lymph nodes.  Physical Exam: BP 139/74 (BP  Location: Right Arm, Patient Position: Sitting, Cuff Size: Normal)   Pulse 75   Temp 99.5 F (37.5 C) (Oral)   Ht '5\' 2"'$  (1.575 m)   Wt 135 lb (61.2 kg)   BMI 24.69 kg/m  General:   Alert and oriented. No distress noted. Pleasant and cooperative.  Head:  Normocephalic and atraumatic. Eyes:  Conjuctiva clear without scleral icterus. Mouth:  Oral mucosa pink and moist. Good dentition. No lesions. Heart: Normal rate and rhythm, s1 and s2 heart sounds present.  Lungs: Clear lung sounds in all lobes. Respirations equal and unlabored. Abdomen:  +BS, soft, non-tender and non-distended. No rebound or guarding. No HSM or masses noted. Derm: No palmar erythema or jaundice Msk:  Symmetrical without gross deformities. Normal posture. Extremities:  Without edema. Neurologic:  Alert and  oriented x4 Psych:  Alert and cooperative. Normal mood and affect.  ASSESSMENT: Christina Miles is a 75 y.o. female presenting today for follow up of elevated AFP s/p Hepatitis C Genotype 2b in 2017, after completing course of epclusa daily for 12 weeks with SVR.   AFP remains elevated at 15.3 as of July 2022 with highest elevation of AFP being 17, two years ago. No abdominal imagining since 2020 (RUQ US WNL). MRI abdominal imaging was attempted to be done last year due to ongoing elevation of AFP, however, MRI was not approved by her insurance, therefore was not performed.   Continued elevation of AFP and lack of recent abdominal imagining warrants obtaining updated liver imaging, given patient's history of HCV. Will order Liver CT, consider RUQ [place] if CT not approved by insurance.   PLAN:  Liver CT, consider RUQ [place] if CT not approved by insurance 2. Consider rechecking AFP in 1 year depending on CT results  Follow Up: 1 year  Case discussed with Dr. Jenetta Downer who is in agreement with plan of care, as outlined above.   Dejon Lukas L. Alver Sorrow, MSN, APRN, AGNP-C Adult-Gerontology Nurse Practitioner [facility]

## 2020-10-31 NOTE — Patient Instructions (Signed)
-  We will order a CT of your liver, if insurance will not approve, we will try to get an Ultrasound.  Follow up in 1 year

## 2020-11-19 ENCOUNTER — Other Ambulatory Visit: Payer: Self-pay | Admitting: Family Medicine

## 2020-11-23 ENCOUNTER — Encounter (INDEPENDENT_AMBULATORY_CARE_PROVIDER_SITE_OTHER): Payer: Medicare Other | Admitting: Ophthalmology

## 2020-11-23 ENCOUNTER — Other Ambulatory Visit: Payer: Self-pay

## 2020-11-23 DIAGNOSIS — H34831 Tributary (branch) retinal vein occlusion, right eye, with macular edema: Secondary | ICD-10-CM

## 2020-11-23 DIAGNOSIS — H34811 Central retinal vein occlusion, right eye, with macular edema: Secondary | ICD-10-CM

## 2020-11-23 DIAGNOSIS — H35033 Hypertensive retinopathy, bilateral: Secondary | ICD-10-CM

## 2020-11-23 DIAGNOSIS — I10 Essential (primary) hypertension: Secondary | ICD-10-CM | POA: Diagnosis not present

## 2020-11-23 DIAGNOSIS — H43813 Vitreous degeneration, bilateral: Secondary | ICD-10-CM

## 2020-11-25 DIAGNOSIS — H401121 Primary open-angle glaucoma, left eye, mild stage: Secondary | ICD-10-CM | POA: Diagnosis not present

## 2020-11-25 DIAGNOSIS — H35351 Cystoid macular degeneration, right eye: Secondary | ICD-10-CM | POA: Diagnosis not present

## 2020-11-25 DIAGNOSIS — H2513 Age-related nuclear cataract, bilateral: Secondary | ICD-10-CM | POA: Diagnosis not present

## 2020-11-25 DIAGNOSIS — H401112 Primary open-angle glaucoma, right eye, moderate stage: Secondary | ICD-10-CM | POA: Diagnosis not present

## 2020-11-29 ENCOUNTER — Other Ambulatory Visit: Payer: Self-pay

## 2020-11-29 ENCOUNTER — Ambulatory Visit (HOSPITAL_COMMUNITY)
Admission: RE | Admit: 2020-11-29 | Discharge: 2020-11-29 | Disposition: A | Discharge status: Home / Self Care | Payer: Medicare Other | Source: Ambulatory Visit | Attending: Gastroenterology | Admitting: Gastroenterology

## 2020-11-29 DIAGNOSIS — B1920 Unspecified viral hepatitis C without hepatic coma: Secondary | ICD-10-CM | POA: Diagnosis not present

## 2020-11-29 DIAGNOSIS — I700 Atherosclerosis of aorta: Secondary | ICD-10-CM | POA: Diagnosis not present

## 2020-11-29 DIAGNOSIS — R772 Abnormality of alphafetoprotein: Secondary | ICD-10-CM | POA: Diagnosis not present

## 2020-11-29 DIAGNOSIS — Z8619 Personal history of other infectious and parasitic diseases: Secondary | ICD-10-CM | POA: Diagnosis not present

## 2020-11-29 DIAGNOSIS — Z9049 Acquired absence of other specified parts of digestive tract: Secondary | ICD-10-CM | POA: Diagnosis not present

## 2020-11-29 DIAGNOSIS — R1011 Right upper quadrant pain: Secondary | ICD-10-CM | POA: Diagnosis not present

## 2020-11-29 LAB — POCT I-STAT CREATININE: Creatinine, Ser: 1.1 mg/dL — ABNORMAL HIGH (ref 0.44–1.00)

## 2020-11-29 MED ORDER — IOHEXOL 350 MG/ML SOLN
100.0000 mL | Freq: Once | INTRAVENOUS | Status: AC | PRN
  Administered 2020-11-29: 80 mL via INTRAVENOUS

## 2020-12-19 ENCOUNTER — Ambulatory Visit (INDEPENDENT_AMBULATORY_CARE_PROVIDER_SITE_OTHER): Payer: Medicare Other | Admitting: Gastroenterology

## 2020-12-21 ENCOUNTER — Other Ambulatory Visit: Payer: Self-pay

## 2020-12-21 ENCOUNTER — Encounter (INDEPENDENT_AMBULATORY_CARE_PROVIDER_SITE_OTHER): Payer: Medicare Other | Admitting: Ophthalmology

## 2020-12-21 DIAGNOSIS — H34811 Central retinal vein occlusion, right eye, with macular edema: Secondary | ICD-10-CM

## 2020-12-21 DIAGNOSIS — H35033 Hypertensive retinopathy, bilateral: Secondary | ICD-10-CM | POA: Diagnosis not present

## 2020-12-21 DIAGNOSIS — H43813 Vitreous degeneration, bilateral: Secondary | ICD-10-CM

## 2020-12-21 DIAGNOSIS — I10 Essential (primary) hypertension: Secondary | ICD-10-CM

## 2020-12-21 DIAGNOSIS — H34831 Tributary (branch) retinal vein occlusion, right eye, with macular edema: Secondary | ICD-10-CM

## 2020-12-21 DIAGNOSIS — H2513 Age-related nuclear cataract, bilateral: Secondary | ICD-10-CM

## 2020-12-22 ENCOUNTER — Other Ambulatory Visit: Payer: Self-pay | Admitting: Family Medicine

## 2020-12-29 DIAGNOSIS — H401112 Primary open-angle glaucoma, right eye, moderate stage: Secondary | ICD-10-CM | POA: Diagnosis not present

## 2021-01-10 ENCOUNTER — Encounter: Payer: Medicare Other | Admitting: Family Medicine

## 2021-01-18 ENCOUNTER — Encounter (INDEPENDENT_AMBULATORY_CARE_PROVIDER_SITE_OTHER): Payer: Medicare Other | Admitting: Ophthalmology

## 2021-02-02 ENCOUNTER — Other Ambulatory Visit: Payer: Self-pay | Admitting: Family Medicine

## 2021-02-03 ENCOUNTER — Ambulatory Visit (HOSPITAL_COMMUNITY)
Admission: RE | Admit: 2021-02-03 | Discharge: 2021-02-03 | Disposition: A | Discharge status: Home / Self Care | Payer: Medicare Other | Source: Ambulatory Visit | Attending: Family Medicine | Admitting: Family Medicine

## 2021-02-03 ENCOUNTER — Other Ambulatory Visit: Payer: Self-pay

## 2021-02-03 DIAGNOSIS — Z1231 Encounter for screening mammogram for malignant neoplasm of breast: Secondary | ICD-10-CM | POA: Insufficient documentation

## 2021-03-29 ENCOUNTER — Other Ambulatory Visit: Payer: Self-pay | Admitting: Family Medicine

## 2021-04-11 ENCOUNTER — Other Ambulatory Visit: Payer: Self-pay | Admitting: Family Medicine

## 2021-04-12 ENCOUNTER — Ambulatory Visit (INDEPENDENT_AMBULATORY_CARE_PROVIDER_SITE_OTHER): Payer: Medicare Other | Admitting: Family Medicine

## 2021-04-12 ENCOUNTER — Other Ambulatory Visit: Payer: Self-pay

## 2021-04-12 ENCOUNTER — Encounter: Payer: Self-pay | Admitting: Family Medicine

## 2021-04-12 VITALS — BP 134/82 | HR 82 | Ht 62.0 in | Wt 141.0 lb

## 2021-04-12 DIAGNOSIS — Z0000 Encounter for general adult medical examination without abnormal findings: Secondary | ICD-10-CM | POA: Diagnosis not present

## 2021-04-12 DIAGNOSIS — Z23 Encounter for immunization: Secondary | ICD-10-CM | POA: Diagnosis not present

## 2021-04-12 DIAGNOSIS — I10 Essential (primary) hypertension: Secondary | ICD-10-CM | POA: Diagnosis not present

## 2021-04-12 DIAGNOSIS — E7849 Other hyperlipidemia: Secondary | ICD-10-CM

## 2021-04-12 DIAGNOSIS — E559 Vitamin D deficiency, unspecified: Secondary | ICD-10-CM

## 2021-04-12 MED ORDER — FLUTICASONE PROPIONATE 50 MCG/ACT NA SUSP: 2.0000 | Freq: Every day | NASAL | 6 refills | Status: AC

## 2021-04-12 NOTE — Assessment & Plan Note (Signed)

## 2021-04-12 NOTE — Patient Instructions (Addendum)
F/U end August, call if you need me sooner  Flu vaccine today  CHEM7 AND EgfR TODAY  FASTING LIPID, CMP AND egfR, Cbc, TSh AND VIT d 1 WEEK BEFORE AUGUST APPOINTMENT  PLEASE GET [facility] VACCINES AT YOUR PHARMACY IF ABLE  Flonase is prescribed for allergies  It is important that you exercise regularly at least 30 minutes 5 times a week. If you develop chest pain, have severe difficulty breathing, or feel very tired, stop exercising immediately and seek medical attention   Think about what you will eat, plan ahead. Choose " clean, green, fresh or frozen" over canned, processed or packaged foods which are more sugary, salty and fatty. 70 to 75% of food eaten should be vegetables and fruit. Three meals at set times with snacks allowed between meals, but they must be fruit or vegetables. Aim to eat over a 12 hour period , example 7 am to 7 pm, and STOP after  your last meal of the day. Drink water,generally about 64 ounces per day, no other drink is as healthy. Fruit juice is best enjoyed in a healthy way, by EATING the fruit. Thanks for choosing [facility], we consider it a privelige to serve you.

## 2021-04-13 LAB — CMP14+EGFR
ALT: 11 IU/L (ref 0–32)
AST: 15 IU/L (ref 0–40)
Albumin/Globulin Ratio: 1.6 (ref 1.2–2.2)
Albumin: 4.8 g/dL — ABNORMAL HIGH (ref 3.7–4.7)
Alkaline Phosphatase: 113 IU/L (ref 44–121)
BUN/Creatinine Ratio: 12 (ref 12–28)
BUN: 12 mg/dL (ref 8–27)
Bilirubin Total: 0.7 mg/dL (ref 0.0–1.2)
CO2: 25 mmol/L (ref 20–29)
Calcium: 10.1 mg/dL (ref 8.7–10.3)
Chloride: 101 mmol/L (ref 96–106)
Creatinine, Ser: 0.99 mg/dL (ref 0.57–1.00)
Globulin, Total: 3 g/dL (ref 1.5–4.5)
Glucose: 86 mg/dL (ref 70–99)
Potassium: 4.4 mmol/L (ref 3.5–5.2)
Sodium: 140 mmol/L (ref 134–144)
Total Protein: 7.8 g/dL (ref 6.0–8.5)
eGFR: 60 mL/min/{1.73_m2} (ref >59)

## 2021-04-15 ENCOUNTER — Encounter: Payer: Self-pay | Admitting: Family Medicine

## 2021-04-15 NOTE — Progress Notes (Signed)
° ° °  Christina Miles     MRN: [id number]      DOB: Feb 17, 1946  HPI: Patient is in for annual physical exam. Increased and uncontrolled allergy symptoms, nasal congestion and drainage x 1 month. Recent labs,  are reviewed. Immunization is reviewed , and  updated if needed.   PE: BP 134/82    Pulse 82    Ht 5\' 2"  (1.575 m)    Wt 141 lb 0.6 oz (64 kg)    SpO2 97%    BMI 25.80 kg/m   Pleasant  female, alert and oriented x 3, in no cardio-pulmonary distress. Afebrile. HEENT No facial trauma or asymetry. Sinuses non tender.  Extra occullar muscles intact.. External ears normal, . Neck: supple, no adenopathy,JVD or thyromegaly.No bruits.  Chest: Clear to ascultation bilaterally.No crackles or wheezes. Non tender to palpation  Cardiovascular system; Heart sounds normal,  S1 and  S2 ,no S3.  No murmur, or thrill. Apical beat not displaced Peripheral pulses normal.  Abdomen: Soft, non tender, no organomegaly or masses. No bruits. Bowel sounds normal. No guarding, tenderness or rebound.    Musculoskeletal exam: Full ROM of spine, hips , shoulders and knees. No deformity ,swelling or crepitus noted. No muscle wasting or atrophy.   Neurologic: Cranial nerves 2 to 12 intact. Power, tone ,sensation and reflexes normal throughout. No disturbance in gait. No tremor.  Skin: Intact, no ulceration, erythema , scaling or rash noted. Pigmentation normal throughout  Psych; Normal mood and affect. Judgement and concentration normal   Assessment & Plan:  Annual physical exam Annual exam as documented. Counseling done  re healthy lifestyle involving commitment to 150 minutes exercise per week, heart healthy diet, and attaining healthy weight.The importance of adequate sleep also discussed. Regular seat belt use and home safety, is also discussed. Changes in health habits are decided on by the patient with goals and time frames  set for achieving them. Immunization and cancer  screening needs are specifically addressed at this visit.

## 2021-05-11 ENCOUNTER — Other Ambulatory Visit: Payer: Self-pay | Admitting: Family Medicine

## 2021-06-13 ENCOUNTER — Other Ambulatory Visit: Payer: Self-pay | Admitting: Family Medicine

## 2021-06-22 DIAGNOSIS — T162XXA Foreign body in left ear, initial encounter: Secondary | ICD-10-CM | POA: Diagnosis not present

## 2021-07-12 ENCOUNTER — Other Ambulatory Visit: Payer: Self-pay | Admitting: Family Medicine

## 2021-08-08 DIAGNOSIS — H401112 Primary open-angle glaucoma, right eye, moderate stage: Secondary | ICD-10-CM | POA: Diagnosis not present

## 2021-08-08 DIAGNOSIS — H04123 Dry eye syndrome of bilateral lacrimal glands: Secondary | ICD-10-CM | POA: Diagnosis not present

## 2021-08-08 DIAGNOSIS — H401121 Primary open-angle glaucoma, left eye, mild stage: Secondary | ICD-10-CM | POA: Diagnosis not present

## 2021-08-08 DIAGNOSIS — H2513 Age-related nuclear cataract, bilateral: Secondary | ICD-10-CM | POA: Diagnosis not present

## 2021-08-09 ENCOUNTER — Other Ambulatory Visit: Payer: Self-pay | Admitting: Family Medicine

## 2021-08-28 ENCOUNTER — Ambulatory Visit (INDEPENDENT_AMBULATORY_CARE_PROVIDER_SITE_OTHER): Payer: Medicare Other

## 2021-08-28 DIAGNOSIS — Z0000 Encounter for general adult medical examination without abnormal findings: Secondary | ICD-10-CM

## 2021-08-28 NOTE — Progress Notes (Signed)
I connected with  Chriss Czar on 08/28/21 by a audio enabled telemedicine application and verified that I am speaking with the correct person using two identifiers.  Patient Location: Home  Provider Location: Office/Clinic  I discussed the limitations of evaluation and management by telemedicine. The patient expressed understanding and agreed to proceed.  Subjective:   Christina Miles is a 76 y.o. female who presents for Medicare Annual (Subsequent) preventive examination.  Review of Systems     Cardiac Risk Factors include: dyslipidemia;hypertension;advanced age (>49mn, >>22women)     Objective:    There were no vitals filed for this visit. There is no height or weight on file to calculate BMI.     08/28/2021    9:34 AM 08/17/2020    1:09 PM 10/20/2019    3:30 PM 10/24/2017   11:29 AM 02/20/2012    8:31 AM  Advanced Directives  Does Patient Have a Medical Advance Directive? No No No No Patient does not have advance directive;Patient would like information  Does patient want to make changes to medical advance directive?  No - Patient declined     Would patient like information on creating a medical advance directive? Yes (ED - Information included in AVS)  Yes (MAU/Ambulatory/Procedural Areas - Information given) Yes (MAU/Ambulatory/Procedural Areas - Information given) Advance directive packet given  Pre-existing out of facility DNR order (yellow form or pink MOST form)     No    Current Medications (verified) Outpatient Encounter Medications as of 08/28/2021  Medication Sig   amLODipine (NORVASC) 5 MG tablet TAKE ONE TABLET BY MOUTH DAILY   brimonidine-timolol (COMBIGAN) 0.2-0.5 % ophthalmic solution Place 1 drop into both eyes every 12 (twelve) hours.   Calcium Carbonate-Vitamin D 600-400 MG-UNIT tablet Take 2 tablets by mouth daily.   fluticasone (FLONASE) 50 MCG/ACT nasal spray Place 2 sprays into both nostrils daily.   hydrochlorothiazide (HYDRODIURIL) 25 MG tablet  TAKE (1) TABLET BY MOUTH ONCE DAILY.   latanoprost (XALATAN) 0.005 % ophthalmic solution Place 1 drop into both eyes at bedtime.   mirtazapine (REMERON) 7.5 MG tablet TAKE ONE TABLET BY MOUTH AT BEDTIME.   potassium chloride (KLOR-CON) 10 MEQ tablet TAKE (1) TABLET BY MOUTH TWICE DAILY.   prednisoLONE acetate (PRED FORTE) 1 % ophthalmic suspension Place 1 drop into the right eye 4 (four) times daily.   No facility-administered encounter medications on file as of 08/28/2021.    Allergies (verified) Ace inhibitors   History: Past Medical History:  Diagnosis Date   Arthritis    Not sure of date first noted   Cataract    Depression 2014   Glaucoma 2018   Hepatitis C    Hypertension    Reduced vision 2015   right, had hemmorhage, treated by MRodena Piety injections   Past Surgical History:  Procedure Laterality Date   ABDOMINAL HYSTERECTOMY  1985   . ? mass on ovary   APPENDECTOMY     BACK SURGERY     CHOLECYSTECTOMY     COLONOSCOPY  02/20/2012   Procedure: COLONOSCOPY;  Surgeon: NRogene Houston MD;  [facility];  Service: Endoscopy;  Laterality: N/A;  930   COLONOSCOPY     Patient states that this was 3-4 years ago '@[facility]    COLONOSCOPY N/A 10/24/2017   Rehman: small polyp at splenic flexure and distal sigmoid colon, external hemorrhoids. (Tubular adenoma)   EYE SURGERY  2019   GLAUCOMA SURGERY     05/2017   POLYPECTOMY  10/24/2017   Procedure: POLYPECTOMY;  Surgeon: Rogene Houston, MD;  Location: [facility];  Service: Endoscopy;;  colon   [place] approx   [place]   Family History  Problem Relation Age of Onset   Colon cancer Father    Dementia Mother    Diabetes Brother    Hypertension Brother    Hypertension Brother    Social History   Socioeconomic History   Marital status: Married    Spouse name: Not on file   Number of children: Not on file   Years of education: Not on file   Highest education level: Not on file  Occupational History    Not on file  Tobacco Use   Smoking status: Former    Packs/day: 1.00    Years: 10.00    Total pack years: 10.00    Types: Cigarettes    Quit date: 12/01/1990    Years since quitting: 30.7   Smokeless tobacco: Never  Vaping Use   Vaping Use: Never used  Substance and Sexual Activity   Alcohol use: No   Drug use: No   Sexual activity: Yes  Other Topics Concern   Not on file  Social History Narrative   Not on file   Social Determinants of Health   Financial Resource Strain: Low Risk  (08/28/2021)   Overall Financial Resource Strain (CARDIA)    Difficulty of Paying Living Expenses: Not hard at all  Food Insecurity: No Food Insecurity (08/28/2021)   Hunger Vital Sign    Worried About Running Out of Food in the Last Year: Never true    [place] in the Last Year: Never true  Transportation Needs: No Transportation Needs (08/28/2021)   PRAPARE - Hydrologist (Medical): No    Lack of Transportation (Non-Medical): No  Physical Activity: Insufficiently Active (08/28/2021)   Exercise Vital Sign    Days of Exercise per Week: 3 days    Minutes of Exercise per Session: 20 min  Stress: No Stress Concern Present (08/17/2020)   [place]    Feeling of Stress : Not at all  Social Connections: Moderately Integrated (08/28/2021)   Social Connection and Isolation Panel [NHANES]    Frequency of Communication with Friends and Family: Three times a week    Frequency of Social Gatherings with Friends and Family: More than three times a week    Attends Religious Services: More than 4 times per year    Active Member of [employer] or Organizations: No    Attends Archivist Meetings: Never    Marital Status: Married    Tobacco Counseling Counseling given: Not Answered   Clinical Intake:  Pre-visit preparation completed: Yes  Pain : No/denies pain     Nutritional Status: BMI of 19-24   Normal Diabetes: No  How often do you need to have someone help you when you read instructions, pamphlets, or other written materials from your doctor or pharmacy?: 1 - Never What is the last grade level you completed in school?: 1 yr college  Diabetic?na  Interpreter Needed?: No      Activities of Daily Living    08/28/2021    9:33 AM 08/28/2021    8:22 AM  In your present state of health, do you have any difficulty performing the following activities:  Hearing? 0 0  Vision? 0 0  Difficulty concentrating or making decisions? 0 0  Walking or climbing stairs? 0 0  Dressing or bathing? 0 0  Doing errands, shopping? 0 0  Preparing Food and eating ? N N  Using the Toilet? N N  In the past six months, have you accidently leaked urine? N Y  Do you have problems with loss of bowel control? N N  Managing your Medications? N N  Managing your Finances? N N  Housekeeping or managing your Housekeeping?  N    Patient Care Team: Fayrene Helper, MD as PCP - General Minus Breeding, MD as PCP - Cardiology (Cardiology) Hayden Pedro, MD as Consulting Physician (Ophthalmology)  Indicate any recent Medical Services you may have received from other than [facility] providers in the past year (date may be approximate).     Assessment:   This is a routine wellness examination for [place].  Hearing/Vision screen No results found.  Dietary issues and exercise activities discussed:     Goals Addressed   None    Depression Screen    04/12/2021   10:06 AM 09/21/2020    9:15 AM 08/17/2020    1:13 PM 05/17/2020    9:11 AM 01/18/2020    9:49 AM 01/04/2020    9:26 AM 10/06/2019    1:39 PM  PHQ 2/9 Scores  PHQ - 2 Score 0 0 0 0 0 0 0  PHQ- 9 Score    0 0 0     Fall Risk    08/28/2021    9:33 AM 08/28/2021    8:22 AM 04/12/2021   10:06 AM 09/21/2020    9:14 AM 08/17/2020    1:12 PM  Fall Risk   Falls in the past year? 0 0 0 0 0  Number falls in past yr: 0 0 0 0 0  Injury with Fall? 0 0  0 0 0  Risk for fall due to :   No Fall Risks No Fall Risks No Fall Risks  Follow up   Falls evaluation completed Falls evaluation completed Falls evaluation completed;Falls prevention discussed    FALL RISK PREVENTION PERTAINING TO THE HOME:  Any stairs in or around the home? Yes  If so, are there any without handrails? No  Home free of loose throw rugs in walkways, pet beds, electrical cords, etc? Yes  Adequate lighting in your home to reduce risk of falls? Yes   ASSISTIVE DEVICES UTILIZED TO PREVENT FALLS:  Life alert? No  Use of a cane, walker or w/c? No  Grab bars in the bathroom? yes Shower chair or bench in shower? Yes  Elevated toilet seat or a handicapped toilet? Yes   Cognitive Function:        08/28/2021    9:39 AM 07/27/2019    8:18 AM 07/17/2018    2:02 PM  6CIT Screen  What Year? 0 points 0 points 0 points  What month? 0 points 0 points 0 points  What time? 0 points 0 points 0 points  Count back from 20 0 points 0 points 0 points  Months in reverse 0 points 0 points 0 points  Repeat phrase 0 points 0 points 0 points  Total Score 0 points 0 points 0 points    Immunizations Immunization History  Administered Date(s) Administered   Fluad Quad(high Dose 65+) 12/23/2018, 01/04/2020, 04/12/2021   Influenza Split 01/23/2012   Influenza Whole 12/18/2005   Influenza,inj,Quad PF,6+ Mos 12/18/2012, 01/26/2014, 03/15/2015, 01/26/2016, 01/03/2017, 01/07/2018   PFIZER(Purple Top)SARS-COV-2 Vaccination 05/09/2019, 06/01/2019, 12/15/2019   Pfizer Covid-19 Vaccine  Bivalent Booster 73yr & up 01/13/2021   Pneumococcal Conjugate-13 10/28/2013   Pneumococcal Polysaccharide-23 12/18/2012   Td 08/09/2003   Tdap 10/05/2014   Zoster, Live 01/23/2012    TDAP status: Due, Education has been provided regarding the importance of this vaccine. Advised may receive this vaccine at local pharmacy or Health Dept. Aware to provide a copy of the vaccination record if obtained from local  pharmacy or Health Dept. Verbalized acceptance and understanding.  Flu Vaccine status: Up to date  Pneumococcal vaccine status: Up to date  Covid-19 vaccine status: Completed vaccines  Qualifies for Shingles Vaccine? Yes   Zostavax completed No   Shingrix Completed?: No.    Education has been provided regarding the importance of this vaccine. Patient has been advised to call insurance company to determine out of pocket expense if they have not yet received this vaccine. Advised may also receive vaccine at local pharmacy or Health Dept. Verbalized acceptance and understanding. Will go this week   Screening Tests Health Maintenance  Topic Date Due   Zoster Vaccines- Shingrix (1 of 2) Never done   COVID-19 Vaccine (5 - Pfizer series) 05/16/2021   INFLUENZA VACCINE  10/17/2021   MAMMOGRAM  02/03/2022   COLONOSCOPY (Pts 45-481yrInsurance coverage will need to be confirmed)  10/25/2022   TETANUS/TDAP  10/04/2024   Pneumonia Vaccine 6575Years old  Completed   DEXA SCAN  Completed   Hepatitis C Screening  Completed   HPV VACCINES  Aged Out    Health Maintenance  Health Maintenance Due  Topic Date Due   Zoster Vaccines- Shingrix (1 of 2) Never done   COVID-19 Vaccine (5 - Pfizer series) 05/16/2021    Colorectal cancer screening: Type of screening: Colonoscopy. Completed yes. Repeat every 5 years due to 2024 with rehman   Mammogram status: Completed yes. Repeat every year  Bone Density status: Completed yes. Results reflect: Bone density results: OSTEOPENIA. Repeat every 2 years.  Lung Cancer Screening: (Low Dose CT Chest recommended if Age 74-80ears, 30 pack-year currently smoking OR have quit w/in 15years.) does not qualify.   Lung Cancer Screening Referral: na  Additional Screening:  Hepatitis C Screening: does not qualify; Completed yes  Vision Screening: Recommended annual ophthalmology exams for early detection of glaucoma and other disorders of the eye. Is the  patient up to date with their annual eye exam?  Yes  Who is the provider or what is the name of the office in which the patient attends annual eye exams? Dr grKaty Fitchf pt is not established with a provider, would they like to be referred to a provider to establish care? Yes .   Dental Screening: Recommended annual dental exams for proper oral hygiene  Community Resource Referral / Chronic Care Management: CRR required this visit?  No   CCM required this visit?  No      Plan:     I have personally reviewed and noted the following in the patient's chart:   Medical and social history Use of alcohol, tobacco or illicit drugs  Current medications and supplements including opioid prescriptions.  Functional ability and status Nutritional status Physical activity Advanced directives List of other physicians Hospitalizations, surgeries, and ER visits in previous 12 months Vitals Screenings to include cognitive, depression, and falls Referrals and appointments  In addition, I have reviewed and discussed with patient certain preventive protocols, quality metrics, and best practice recommendations. A written personalized care plan for preventive services as well as general preventive health  recommendations were provided to patient.     Eual Fines, LPN   04/18/8655   Nurse Notes:  Ms. Gaertner , Thank you for taking time to come for your Medicare Wellness Visit. I appreciate your ongoing commitment to your health goals. Please review the following plan we discussed and let me know if I can assist you in the future.   These are the goals we discussed:  Goals      DIET - INCREASE WATER INTAKE     Wants to drink more water      LIFESTYLE - DECREASE FALLS RISK        This is a list of the screening recommended for you and due dates:  Health Maintenance  Topic Date Due   Zoster (Shingles) Vaccine (1 of 2) Never done   COVID-19 Vaccine (5 - Pfizer series) 05/16/2021   Flu Shot   10/17/2021   Mammogram  02/03/2022   Colon Cancer Screening  10/25/2022   Tetanus Vaccine  10/04/2024   Pneumonia Vaccine  Completed   DEXA scan (bone density measurement)  Completed   Hepatitis C Screening: USPSTF Recommendation to screen - Ages 33-79 yo.  Completed   HPV Vaccine  Aged Out

## 2021-08-28 NOTE — Patient Instructions (Signed)
  Christina Miles , Thank you for taking time to come for your Medicare Wellness Visit. I appreciate your ongoing commitment to your health goals. Please review the following plan we discussed and let me know if I can assist you in the future.    Shingrix vaccine due.   I have mailed advanced directive papers to you.   These are the goals we discussed:  Goals      DIET - INCREASE WATER INTAKE     Wants to drink more water      LIFESTYLE - DECREASE FALLS RISK        This is a list of the screening recommended for you and due dates:  Health Maintenance  Topic Date Due   Zoster (Shingles) Vaccine (1 of 2) Never done   COVID-19 Vaccine (5 - Pfizer series) 05/16/2021   Flu Shot  10/17/2021   Mammogram  02/03/2022   Colon Cancer Screening  10/25/2022   Tetanus Vaccine  10/04/2024   Pneumonia Vaccine  Completed   DEXA scan (bone density measurement)  Completed   Hepatitis C Screening: USPSTF Recommendation to screen - Ages 49-79 yo.  Completed   HPV Vaccine  Aged Out

## 2021-09-12 ENCOUNTER — Other Ambulatory Visit: Payer: Self-pay | Admitting: Family Medicine

## 2021-09-13 DIAGNOSIS — H524 Presbyopia: Secondary | ICD-10-CM | POA: Diagnosis not present

## 2021-09-16 DIAGNOSIS — M25511 Pain in right shoulder: Secondary | ICD-10-CM | POA: Insufficient documentation

## 2021-10-19 ENCOUNTER — Other Ambulatory Visit: Payer: Self-pay | Admitting: Family Medicine

## 2021-11-08 DIAGNOSIS — H401112 Primary open-angle glaucoma, right eye, moderate stage: Secondary | ICD-10-CM | POA: Diagnosis not present

## 2021-11-08 DIAGNOSIS — H2513 Age-related nuclear cataract, bilateral: Secondary | ICD-10-CM | POA: Diagnosis not present

## 2021-11-08 DIAGNOSIS — H35351 Cystoid macular degeneration, right eye: Secondary | ICD-10-CM | POA: Diagnosis not present

## 2021-11-08 DIAGNOSIS — H401121 Primary open-angle glaucoma, left eye, mild stage: Secondary | ICD-10-CM | POA: Diagnosis not present

## 2021-11-13 IMAGING — MG DIGITAL SCREENING BILAT W/ TOMO W/ CAD
8 series · 9 of 24 positions shown · non-contrast
Comparison: Previous exam(s).

CLINICAL DATA: Screening.

EXAM:
DIGITAL SCREENING BILATERAL MAMMOGRAM WITH TOMO AND CAD

[L MLO synth-2D]
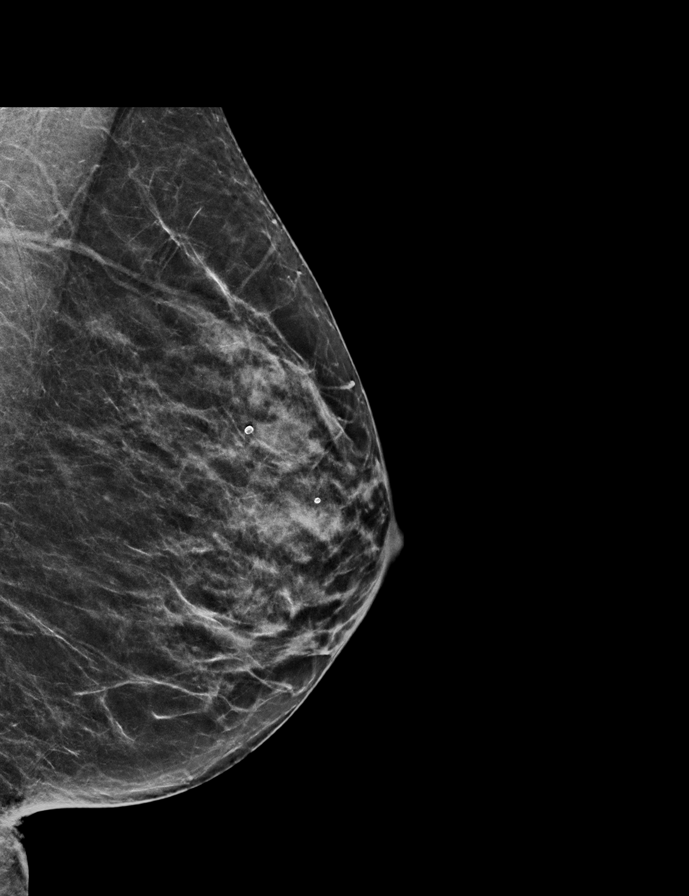

[R MLO synth-2D]
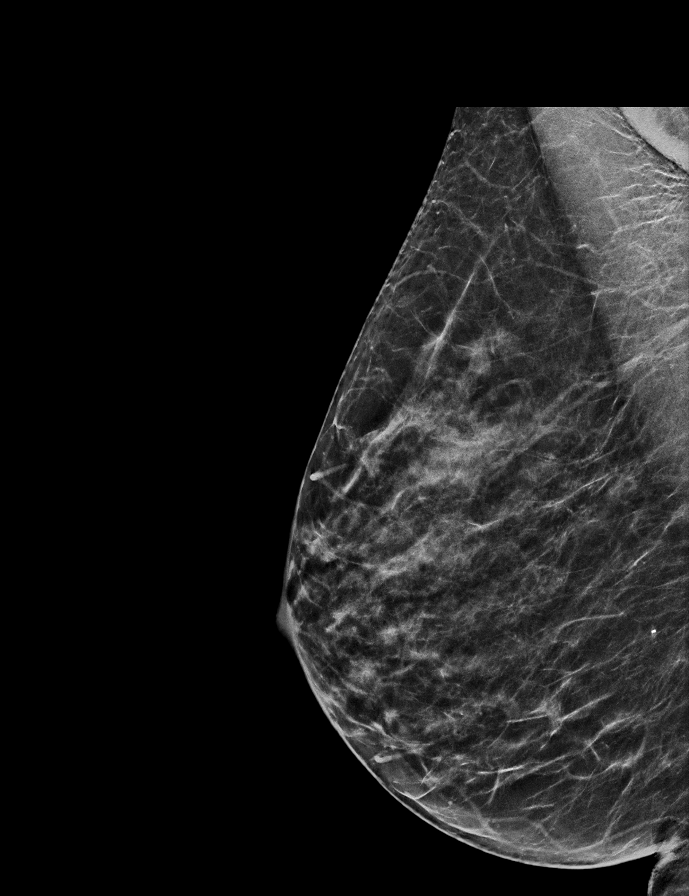

[R CC synth-2D]
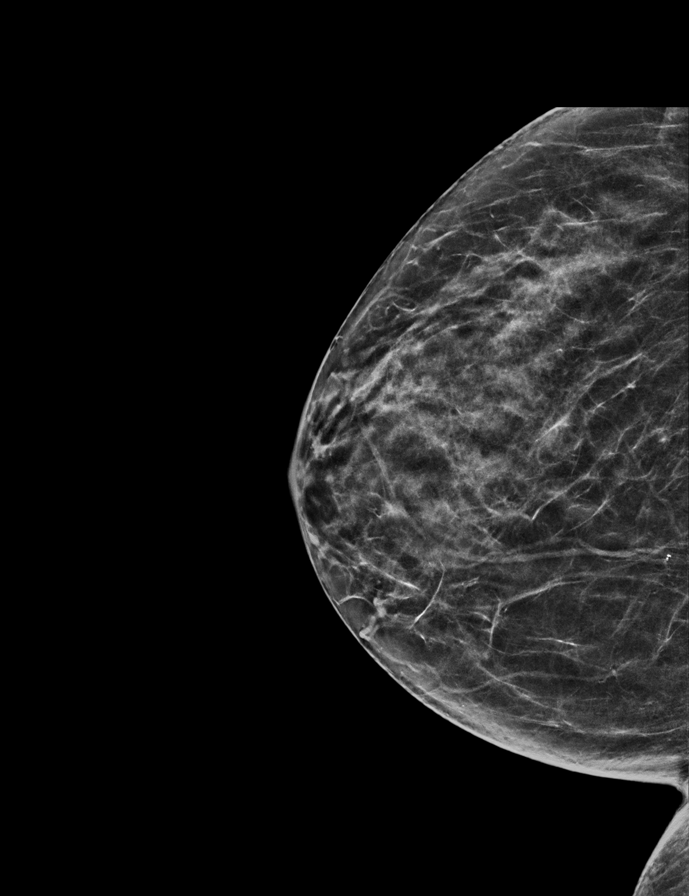

[L CC synth-2D]
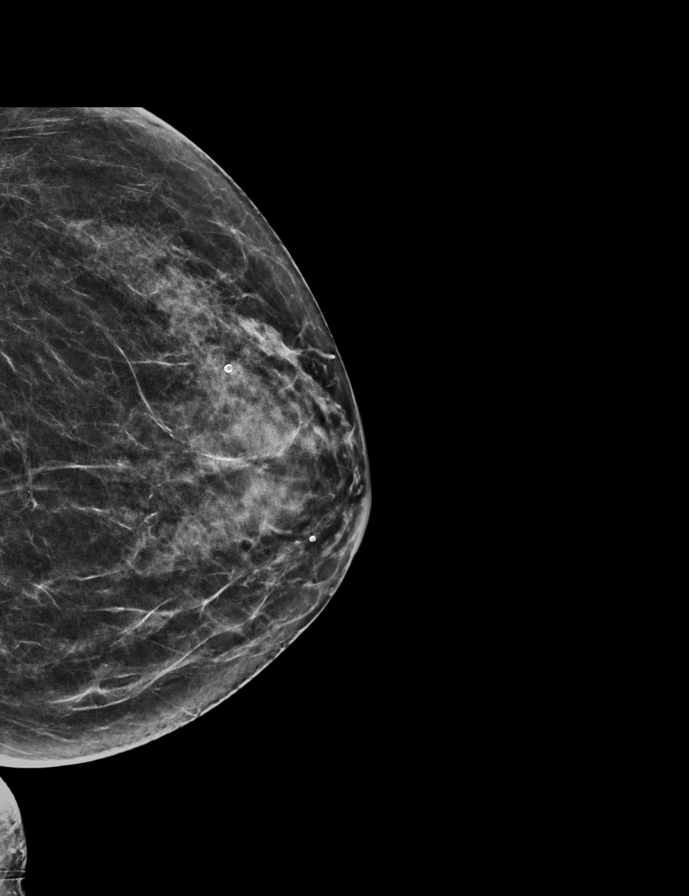

[L CC tomo · 2 of 56 frames shown]
[frame 19/56]
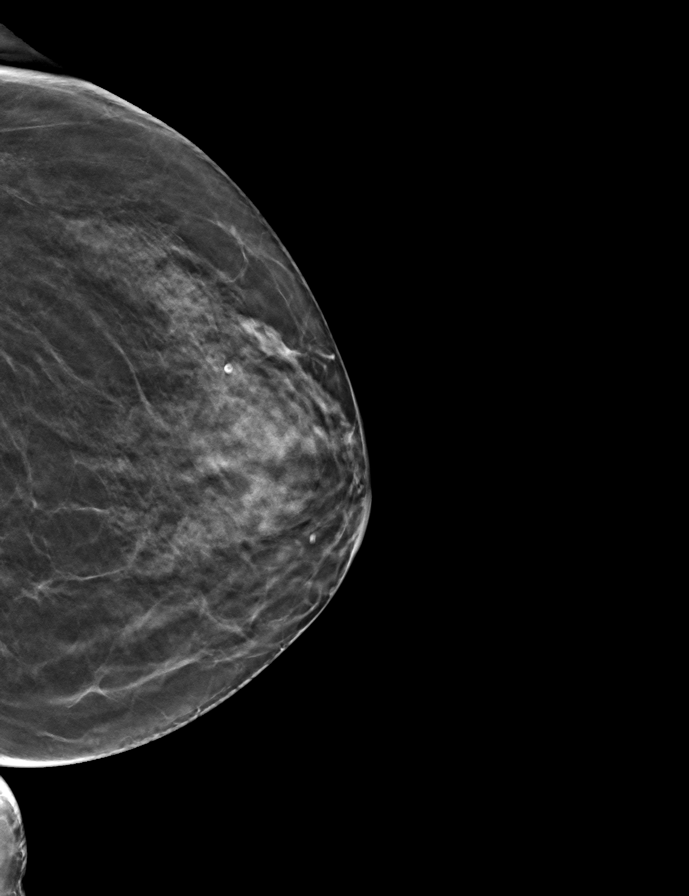
[frame 29/56]
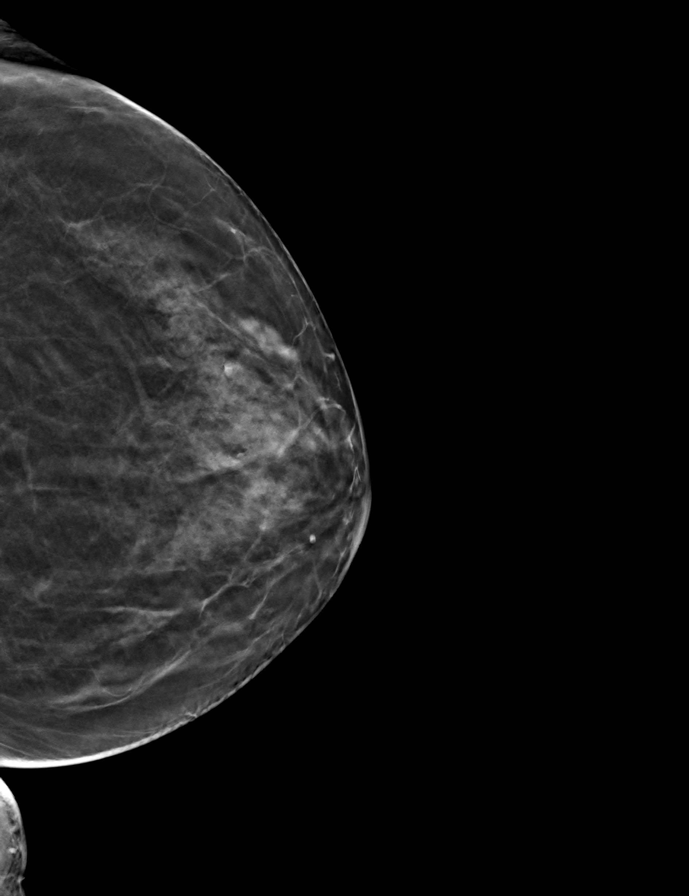

[R CC tomo · tomo slice 29/56.0]
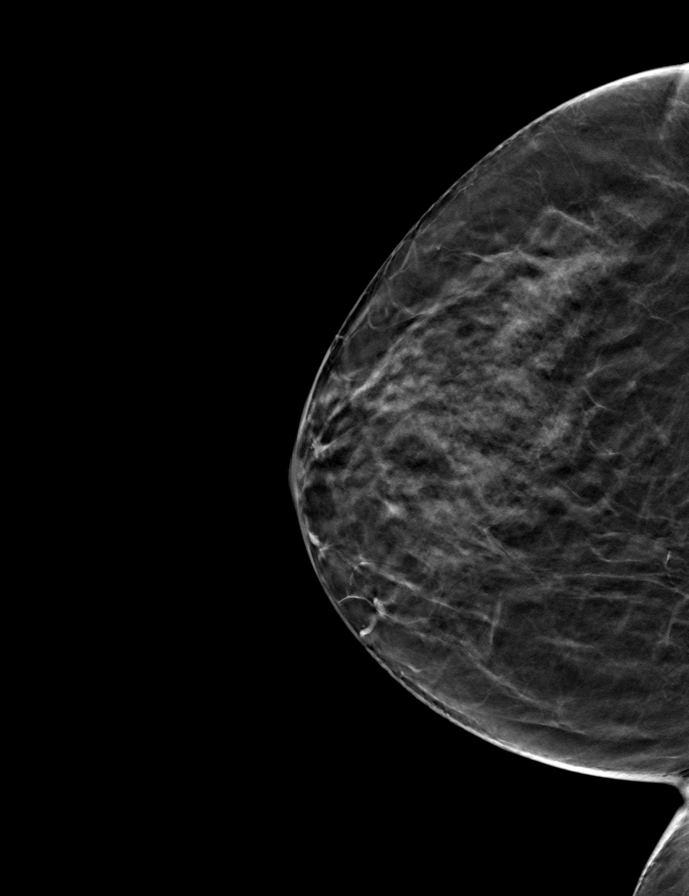

[R MLO tomo · tomo slice 28/55.0]
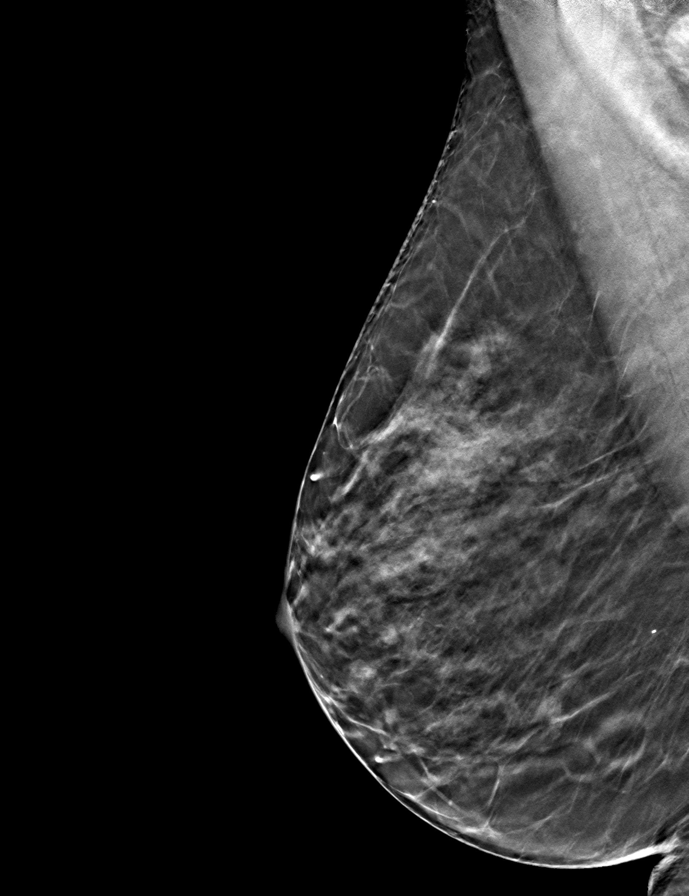

[L MLO tomo · tomo slice 28/55.0]
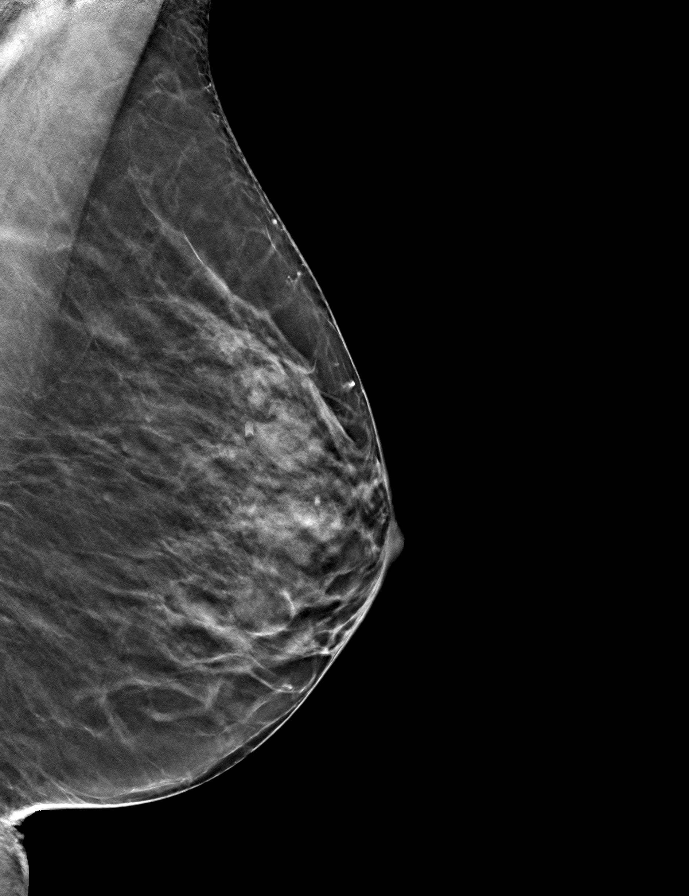

[9 of 24 positions shown; findings below may reference images not displayed]

ACR Breast Density Category c: The breast tissue is heterogeneously
dense, which may obscure small masses.
FINDINGS: There are no findings suspicious for malignancy. Images were
processed with CAD.
IMPRESSION: No mammographic evidence of malignancy. A result letter of this
screening mammogram will be mailed directly to the patient.

RECOMMENDATION:
Screening mammogram in one year. (Code:[id number])

BI-RADS CATEGORY  1: Negative.

## 2021-11-15 ENCOUNTER — Encounter: Payer: Self-pay | Admitting: Family Medicine

## 2021-11-15 ENCOUNTER — Ambulatory Visit (INDEPENDENT_AMBULATORY_CARE_PROVIDER_SITE_OTHER): Payer: Medicare Other | Admitting: Family Medicine

## 2021-11-15 VITALS — BP 120/80 | HR 69 | Ht 62.0 in | Wt 138.0 lb

## 2021-11-15 DIAGNOSIS — F324 Major depressive disorder, single episode, in partial remission: Secondary | ICD-10-CM | POA: Diagnosis not present

## 2021-11-15 DIAGNOSIS — Z800 Family history of malignant neoplasm of digestive organs: Secondary | ICD-10-CM

## 2021-11-15 DIAGNOSIS — I10 Essential (primary) hypertension: Secondary | ICD-10-CM | POA: Diagnosis not present

## 2021-11-15 DIAGNOSIS — Z1231 Encounter for screening mammogram for malignant neoplasm of breast: Secondary | ICD-10-CM

## 2021-11-15 DIAGNOSIS — E7849 Other hyperlipidemia: Secondary | ICD-10-CM

## 2021-11-15 DIAGNOSIS — R772 Abnormality of alphafetoprotein: Secondary | ICD-10-CM

## 2021-11-15 DIAGNOSIS — E559 Vitamin D deficiency, unspecified: Secondary | ICD-10-CM

## 2021-11-15 MED ORDER — MIRTAZAPINE 7.5 MG PO TABS: 7.5000 mg | ORAL_TABLET | Freq: Every day | ORAL | 5 refills | Status: DC

## 2021-11-15 MED ORDER — AMLODIPINE BESYLATE 5 MG PO TABS
5.0000 mg | ORAL_TABLET | Freq: Every day | ORAL | 5 refills | Status: DC
Start: 2021-11-15 — End: 2022-04-17

## 2021-11-15 NOTE — Patient Instructions (Addendum)
Annual exam Jan 26 or after, call if you need me before  Flu vaccine in September in office  Please get fasting CBC, lipid, cmp and eGFR, TSH, vit d and AFP tumor marker in next 1 week  Please schedule mammogram at checkout  You will be referred to Dr Eula Listen for follow up  It is important that you exercise regularly at least 30 minutes 5 times a week. If you develop chest pain, have severe difficulty breathing, or feel very tired, stop exercising immediately and seek medical attention   Thanks for choosing [facility], we consider it a privelige to serve you.

## 2021-11-15 NOTE — Progress Notes (Signed)
   Christina Miles     MRN: [id number]      DOB: 05/22/1945   HPI [name] is here for follow up and re-evaluation of chronic medical conditions, medication management and review of any available recent lab and radiology data.  Preventive health is updated, specifically  Cancer screening and Immunization.   Questions or concerns regarding consultations or procedures which the PT has had in the interim are  addressed. The PT denies any adverse reactions to current medications since the last visit.  There are no new concerns.  There are no specific complaints   ROS Denies recent fever or chills. Denies sinus pressure, nasal congestion, ear pain or sore throat. Denies chest congestion, productive cough or wheezing. Denies chest pains, palpitations and leg swelling Denies abdominal pain, nausea, vomiting,diarrhea or constipation.   Denies dysuria, frequency, hesitancy or incontinence. Denies joint pain, swelling and limitation in mobility. Denies headaches, seizures, numbness, or tingling. Denies depression, anxiety or insomnia. Denies skin break down or rash.   PE  BP 120/80   Pulse 69   Ht '5\' 2"'$  (1.575 m)   Wt 138 lb (62.6 kg)   SpO2 96%   BMI 25.24 kg/m   Patient alert and oriented and in no cardiopulmonary distress.  HEENT: No facial asymmetry, EOMI,     Neck supple .  Chest: Clear to auscultation bilaterally.  CVS: S1, S2 no murmurs, no S3.Regular rate.  ABD: Soft non tender.   Ext: No edema  MS: Adequate ROM spine, shoulders, hips and knees.  Skin: Intact, no ulcerations or rash noted.  Psych: Good eye contact, normal affect. Memory intact not anxious or depressed appearing.  CNS: CN 2-12 intact, power,  normal throughout.no focal deficits noted.   Assessment & Plan  Essential hypertension Controlled, no change in medication DASH diet and commitment to daily physical activity for a minimum of 30 minutes discussed and encouraged, as a part of  hypertension management. The importance of attaining a healthy weight is also discussed.     11/15/2021   10:25 AM 11/15/2021    9:56 AM 04/12/2021   11:05 AM 04/12/2021   11:04 AM 04/12/2021   10:05 AM 10/31/2020    2:16 PM 09/21/2020   10:09 AM  BP/Weight  Systolic BP 476 546 503 546 568 127 517  Diastolic BP 80 74 82 82 64 74 80  Wt. (Lbs)  138   141.04 135   BMI  25.24 kg/m2   25.8 kg/m2 24.69 kg/m2        Elevated AFP rept lab and refer to GI for follow up  Depression, major, single episode, in partial remission (HCC) Controlled, no change in medication   Hyperlipidemia Hyperlipidemia:Low fat diet discussed and encouraged. Updated lab needed at/ before next visit.    Lipid Panel  Lab Results  Component Value Date   CHOL 230 (H) 09/21/2020   HDL 76 09/21/2020   LDLCALC 141 (H) 09/21/2020   TRIG 73 09/21/2020   CHOLHDL 3.0 09/21/2020

## 2021-11-15 NOTE — Assessment & Plan Note (Signed)
Hyperlipidemia:Low fat diet discussed and encouraged. Updated lab needed at/ before next visit.    Lipid Panel  Lab Results  Component Value Date   CHOL 230 (H) 09/21/2020   HDL 76 09/21/2020   LDLCALC 141 (H) 09/21/2020   TRIG 73 09/21/2020   CHOLHDL 3.0 09/21/2020

## 2021-11-15 NOTE — Assessment & Plan Note (Signed)
Controlled, no change in medication DASH diet and commitment to daily physical activity for a minimum of 30 minutes discussed and encouraged, as a part of hypertension management. The importance of attaining a healthy weight is also discussed.     11/15/2021   10:25 AM 11/15/2021    9:56 AM 04/12/2021   11:05 AM 04/12/2021   11:04 AM 04/12/2021   10:05 AM 10/31/2020    2:16 PM 09/21/2020   10:09 AM  BP/Weight  Systolic BP 712 527 129 290 903 014 996  Diastolic BP 80 74 82 82 64 74 80  Wt. (Lbs)  138   141.04 135   BMI  25.24 kg/m2   25.8 kg/m2 24.69 kg/m2

## 2021-11-15 NOTE — Assessment & Plan Note (Signed)
Controlled, no change in medication  

## 2021-11-15 NOTE — Assessment & Plan Note (Signed)
rept lab and refer to GI for follow up

## 2021-11-22 DIAGNOSIS — E7849 Other hyperlipidemia: Secondary | ICD-10-CM | POA: Diagnosis not present

## 2021-11-22 DIAGNOSIS — Z800 Family history of malignant neoplasm of digestive organs: Secondary | ICD-10-CM | POA: Diagnosis not present

## 2021-11-22 DIAGNOSIS — E559 Vitamin D deficiency, unspecified: Secondary | ICD-10-CM | POA: Diagnosis not present

## 2021-11-22 DIAGNOSIS — I10 Essential (primary) hypertension: Secondary | ICD-10-CM | POA: Diagnosis not present

## 2021-11-23 LAB — CBC
Hematocrit: 37.9 % (ref 34.0–46.6)
Hemoglobin: 12 g/dL (ref 11.1–15.9)
MCH: 29.5 pg (ref 26.6–33.0)
MCHC: 31.7 g/dL (ref 31.5–35.7)
MCV: 93 fL (ref 79–97)
Platelets: 249 10*3/uL (ref 150–450)
RBC: 4.07 x10E6/uL (ref 3.77–5.28)
RDW: 12.1 % (ref 11.7–15.4)
WBC: 5.6 10*3/uL (ref 3.4–10.8)

## 2021-11-23 LAB — LIPID PANEL
Chol/HDL Ratio: 2.9 ratio (ref 0.0–4.4)
Cholesterol, Total: 220 mg/dL — ABNORMAL HIGH (ref 100–199)
HDL: 76 mg/dL (ref >39)
LDL Chol Calc (NIH): 132 mg/dL — ABNORMAL HIGH (ref 0–99)
Triglycerides: 66 mg/dL (ref 0–149)
VLDL Cholesterol Cal: 12 mg/dL (ref 5–40)

## 2021-11-23 LAB — CMP14+EGFR
ALT: 11 IU/L (ref 0–32)
AST: 15 IU/L (ref 0–40)
Albumin/Globulin Ratio: 1.5 (ref 1.2–2.2)
Albumin: 4.4 g/dL (ref 3.8–4.8)
Alkaline Phosphatase: 91 IU/L (ref 44–121)
BUN/Creatinine Ratio: 15 (ref 12–28)
BUN: 14 mg/dL (ref 8–27)
Bilirubin Total: 0.9 mg/dL (ref 0.0–1.2)
CO2: 23 mmol/L (ref 20–29)
Calcium: 9.8 mg/dL (ref 8.7–10.3)
Chloride: 105 mmol/L (ref 96–106)
Creatinine, Ser: 0.95 mg/dL (ref 0.57–1.00)
Globulin, Total: 2.9 g/dL (ref 1.5–4.5)
Glucose: 73 mg/dL (ref 70–99)
Potassium: 4.2 mmol/L (ref 3.5–5.2)
Sodium: 143 mmol/L (ref 134–144)
Total Protein: 7.3 g/dL (ref 6.0–8.5)
eGFR: 63 mL/min/{1.73_m2} (ref >59)

## 2021-11-23 LAB — AFP TUMOR MARKER: AFP, Serum, Tumor Marker: 13.7 ng/mL — ABNORMAL HIGH (ref 0.0–9.2)

## 2021-11-23 LAB — VITAMIN D 25 HYDROXY (VIT D DEFICIENCY, FRACTURES): Vit D, 25-Hydroxy: 21.3 ng/mL — ABNORMAL LOW (ref 30.0–100.0)

## 2021-11-23 LAB — TSH: TSH: 1.18 u[IU]/mL (ref 0.450–4.500)

## 2021-12-05 ENCOUNTER — Ambulatory Visit (INDEPENDENT_AMBULATORY_CARE_PROVIDER_SITE_OTHER): Payer: Medicare Other | Admitting: Gastroenterology

## 2021-12-05 ENCOUNTER — Encounter: Payer: Self-pay | Admitting: *Deleted

## 2021-12-05 ENCOUNTER — Encounter: Payer: Self-pay | Admitting: Gastroenterology

## 2021-12-05 VITALS — BP 146/81 | HR 76 | Temp 97.5°F | Ht 62.0 in | Wt 139.8 lb

## 2021-12-05 DIAGNOSIS — R772 Abnormality of alphafetoprotein: Secondary | ICD-10-CM | POA: Diagnosis not present

## 2021-12-05 DIAGNOSIS — Z86010 Personal history of colonic polyps: Secondary | ICD-10-CM | POA: Diagnosis not present

## 2021-12-05 DIAGNOSIS — Z8719 Personal history of other diseases of the digestive system: Secondary | ICD-10-CM

## 2021-12-05 NOTE — Progress Notes (Signed)
GI Office Note    Referring Provider: Fayrene Helper, MD Primary Care Physician:  Fayrene Helper, MD  Primary Gastroenterologist: previously Dr. Laural Golden  Chief Complaint   Chief Complaint  Patient presents with   Follow-up    Doing well, no current issues.     History of Present Illness   Christina Miles is a 76 y.o. female presenting today for follow-up.  Previously seen by Dr. Laural Golden for elevated AFP, history of hepatitis C.  Last seen in his office in August 2022 by Scherrie Gerlach, NP.  Hepatitis C treated in 2017 with Epclusa.  Documented SVR 2019.  Elastography showed F0/1 fibrosis.  She had elevated AFP of 17, June 2020.  In July 2022 AFP was 15.3.  Last liver imaging via CT in September 2022 showing no discrete contour nodularity.  No concerning lesions.  Recent labs by PCP, November 22, 2021: AFP down to 13.7, LFTs normal.  CBC normal.  Patient doing well. BM regular. No melena. No brbpr. No abdominal pain. Appetite is good. No complaints.   Colonoscopy August 2019: Small polyp at splenic flexure and distal sigmoid colon, external hemorrhoids.  Path showed tubular adenoma.  Next colonoscopy due 2024.   Medications   Current Outpatient Medications  Medication Sig Dispense Refill   amLODipine (NORVASC) 5 MG tablet Take 1 tablet (5 mg total) by mouth daily. 30 tablet 5   brimonidine-timolol (COMBIGAN) 0.2-0.5 % ophthalmic solution Place 1 drop into the left eye every 12 (twelve) hours.     Calcium Carbonate-Vitamin D 600-400 MG-UNIT tablet Take 2 tablets by mouth daily.     fluticasone (FLONASE) 50 MCG/ACT nasal spray Place 2 sprays into both nostrils daily. 16 g 6   hydrochlorothiazide (HYDRODIURIL) 25 MG tablet TAKE (1) TABLET BY MOUTH ONCE DAILY. 90 tablet 0   latanoprost (XALATAN) 0.005 % ophthalmic solution Place 1 drop into the left eye at bedtime.     mirtazapine (REMERON) 7.5 MG tablet Take 1 tablet (7.5 mg total) by mouth at bedtime. 30 tablet 5    potassium chloride (KLOR-CON) 10 MEQ tablet TAKE (1) TABLET BY MOUTH TWICE DAILY. 180 tablet 0   No current facility-administered medications for this visit.    Allergies   Allergies as of 12/05/2021 - Review Complete 12/05/2021  Allergen Reaction Noted   Ace inhibitors Cough 10/23/2011        Review of Systems   General: Negative for anorexia, weight loss, fever, chills, fatigue, weakness. ENT: Negative for hoarseness, difficulty swallowing , nasal congestion. CV: Negative for chest pain, angina, palpitations, dyspnea on exertion, peripheral edema.  Respiratory: Negative for dyspnea at rest, dyspnea on exertion, cough, sputum, wheezing.  GI: See history of present illness. GU:  Negative for dysuria, hematuria, urinary incontinence, urinary frequency, nocturnal urination.  Endo: Negative for unusual weight change.     Physical Exam   BP (!) 146/81 (BP Location: Right Arm, Patient Position: Sitting, Cuff Size: Normal)   Pulse 76   Temp (!) 97.5 F (36.4 C) (Temporal)   Ht '5\' 2"'$  (1.575 m)   Wt 139 lb 12.8 oz (63.4 kg)   SpO2 99%   BMI 25.57 kg/m    General: Well-nourished, well-developed in no acute distress.  Eyes: No icterus. Mouth: Oropharyngeal mucosa moist and pink , no lesions erythema or exudate. Abdomen: Bowel sounds are normal, nontender, nondistended, no hepatosplenomegaly or masses,  no abdominal bruits or hernia , no rebound or guarding.  Rectal: not performed Extremities:  No lower extremity edema. No clubbing or deformities. Neuro: Alert and oriented x 4   Skin: Warm and dry, no jaundice.   Psych: Alert and cooperative, normal mood and affect.  Labs   Lab Results  Component Value Date   CREATININE 0.95 11/22/2021   BUN 14 11/22/2021   NA 143 11/22/2021   K 4.2 11/22/2021   CL 105 11/22/2021   CO2 23 11/22/2021   Lab Results  Component Value Date   ALT 11 11/22/2021   AST 15 11/22/2021   ALKPHOS 91 11/22/2021   BILITOT 0.9 11/22/2021   Lab  Results  Component Value Date   WBC 5.6 11/22/2021   HGB 12.0 11/22/2021   HCT 37.9 11/22/2021   MCV 93 11/22/2021   PLT 249 11/22/2021    Imaging Studies   No results found.  Assessment   Elevated AFP: Prior history of hepatitis C, status posttreatment with SVR.  F0/F1 on elastography.  She really did not meet criteria for hepatoma screening based on her scores.  However AFP was obtained and was elevated in 2020 at its highest of 17.  Due to elevated AFP, has been repeated every 6 months or so.  AFP has been in the 13-15 range since then, most recently 13.7.  Last imaging via CT liver in September 2022 which was unremarkable.  History of adenomatous colon polyps: Due for colonoscopy in August 2024.  PLAN   Ruq u/s with elastography to reassess level of fibrosis.  Due for colonoscopy next year. Will have her return for OV in 08/2022 and schedule colonoscopy at that time.    Laureen Ochs. Bobby Rumpf, [place], [facility]

## 2021-12-05 NOTE — Patient Instructions (Signed)
U/s with elastography to look at your liver. Return to the office in June 2023. We will schedule colonoscopy at that time.

## 2021-12-11 ENCOUNTER — Ambulatory Visit (HOSPITAL_COMMUNITY)
Admission: RE | Admit: 2021-12-11 | Discharge: 2021-12-11 | Disposition: A | Discharge status: Home / Self Care | Payer: Medicare Other | Source: Ambulatory Visit | Attending: Gastroenterology | Admitting: Gastroenterology

## 2021-12-11 DIAGNOSIS — Z8719 Personal history of other diseases of the digestive system: Secondary | ICD-10-CM | POA: Diagnosis not present

## 2021-12-11 DIAGNOSIS — R772 Abnormality of alphafetoprotein: Secondary | ICD-10-CM | POA: Insufficient documentation

## 2021-12-11 DIAGNOSIS — Z86010 Personal history of colonic polyps: Secondary | ICD-10-CM | POA: Diagnosis not present

## 2021-12-11 DIAGNOSIS — B182 Chronic viral hepatitis C: Secondary | ICD-10-CM | POA: Diagnosis not present

## 2021-12-26 ENCOUNTER — Other Ambulatory Visit: Payer: Self-pay

## 2021-12-26 DIAGNOSIS — R772 Abnormality of alphafetoprotein: Secondary | ICD-10-CM

## 2022-01-11 ENCOUNTER — Other Ambulatory Visit: Payer: Self-pay | Admitting: Family Medicine

## 2022-01-22 ENCOUNTER — Other Ambulatory Visit: Payer: Self-pay | Admitting: Family Medicine

## 2022-02-05 ENCOUNTER — Ambulatory Visit (HOSPITAL_COMMUNITY)
Admission: RE | Admit: 2022-02-05 | Discharge: 2022-02-05 | Disposition: A | Discharge status: Home / Self Care | Payer: Medicare Other | Source: Ambulatory Visit | Attending: Family Medicine | Admitting: Family Medicine

## 2022-02-05 ENCOUNTER — Ambulatory Visit (INDEPENDENT_AMBULATORY_CARE_PROVIDER_SITE_OTHER): Payer: Medicare Other

## 2022-02-05 DIAGNOSIS — Z1231 Encounter for screening mammogram for malignant neoplasm of breast: Secondary | ICD-10-CM | POA: Diagnosis not present

## 2022-02-05 DIAGNOSIS — Z23 Encounter for immunization: Secondary | ICD-10-CM | POA: Diagnosis not present

## 2022-03-21 DIAGNOSIS — H2513 Age-related nuclear cataract, bilateral: Secondary | ICD-10-CM | POA: Diagnosis not present

## 2022-03-21 DIAGNOSIS — H401112 Primary open-angle glaucoma, right eye, moderate stage: Secondary | ICD-10-CM | POA: Diagnosis not present

## 2022-03-21 DIAGNOSIS — H401121 Primary open-angle glaucoma, left eye, mild stage: Secondary | ICD-10-CM | POA: Diagnosis not present

## 2022-03-21 DIAGNOSIS — H35351 Cystoid macular degeneration, right eye: Secondary | ICD-10-CM | POA: Diagnosis not present

## 2022-04-17 ENCOUNTER — Ambulatory Visit (INDEPENDENT_AMBULATORY_CARE_PROVIDER_SITE_OTHER): Payer: Medicare Other | Admitting: Family Medicine

## 2022-04-17 ENCOUNTER — Encounter: Payer: Self-pay | Admitting: Family Medicine

## 2022-04-17 VITALS — BP 124/81 | HR 78 | Ht 62.0 in | Wt 139.1 lb

## 2022-04-17 DIAGNOSIS — Z0001 Encounter for general adult medical examination with abnormal findings: Secondary | ICD-10-CM | POA: Diagnosis not present

## 2022-04-17 DIAGNOSIS — M25551 Pain in right hip: Secondary | ICD-10-CM

## 2022-04-17 DIAGNOSIS — R195 Other fecal abnormalities: Secondary | ICD-10-CM | POA: Diagnosis not present

## 2022-04-17 HISTORY — DX: Encounter for general adult medical examination with abnormal findings: Z00.01

## 2022-04-17 MED ORDER — POTASSIUM CHLORIDE ER 10 MEQ PO TBCR: EXTENDED_RELEASE_TABLET | ORAL | 3 refills | Status: DC

## 2022-04-17 MED ORDER — MIRTAZAPINE 7.5 MG PO TABS: 7.5000 mg | ORAL_TABLET | Freq: Every day | ORAL | 5 refills | Status: DC

## 2022-04-17 MED ORDER — HYDROCHLOROTHIAZIDE 25 MG PO TABS: ORAL_TABLET | ORAL | 3 refills | Status: DC

## 2022-04-17 MED ORDER — AMLODIPINE BESYLATE 5 MG PO TABS: 5.0000 mg | ORAL_TABLET | Freq: Every day | ORAL | 3 refills | Status: DC

## 2022-04-17 NOTE — Assessment & Plan Note (Signed)
37-monthhistory: No inciting trauma.  Referred to orthopedics for further evaluation.  Exam at visit is within normal.

## 2022-04-17 NOTE — Assessment & Plan Note (Signed)

## 2022-04-17 NOTE — Progress Notes (Signed)
    Christina Miles     MRN: [id number]      DOB: November 07, 1945  HPI: Patient is in for annual physical exam. 6 month h/o intermittent right hip pain esp when she just wakes up, certain moves make her  get increased pain and cramping, no falls or near falls. 8 month h/o change in stool caliber, notes stringy stool at least once weekly, no visible blood or black stool H/o tubular adenoma and f/h of colon cancer at advanced age Immunization is reviewed , and  updated if needed.   PE: BP 124/81 (BP Location: Right Arm, Patient Position: Sitting, Cuff Size: Large)   Pulse 78   Ht '5\' 2"'$  (1.575 m)   Wt 139 lb 1.9 oz (63.1 kg)   SpO2 95%   BMI 25.45 kg/m   Pleasant  female, alert and oriented x 3, in no cardio-pulmonary distress. Afebrile. HEENT No facial trauma or asymetry. Sinuses non tender.  Extra occullar muscles intact.. External ears normal, . Neck: supple, no adenopathy,JVD or thyromegaly.No bruits.  Chest: Clear to ascultation bilaterally.No crackles or wheezes. Non tender to palpation    Cardiovascular system; Heart sounds normal,  S1 and  S2 ,no S3.  No murmur, or thrill. Apical beat not displaced Peripheral pulses normal.  Abdomen: Soft, non tender, no organomegaly or masses. . Bowel sounds normal. No guarding, tenderness or rebound.   GU: External genitalia normal female genitalia , normal female distribution of hair. No lesions. Urethral meatus normal in size, no  Prolapse, no lesions visibly  Present. Bladder non tender. Vagina pink and moist , with no visible lesions , discharge present . Adequate pelvic support no  cystocele or rectocele noted Cervix pink and appears healthy, no lesions or ulcerations noted, no discharge noted from os Uterus normal size, no adnexal masses, no cervical motion or adnexal tenderness.   Musculoskeletal exam: Full ROM of spine, hips , shoulders and knees. No deformity ,swelling or crepitus noted. No muscle wasting or  atrophy.   Neurologic: Cranial nerves 2 to 12 intact. Power, tone ,sensation and reflexes normal throughout. No disturbance in gait. No tremor.  Skin: Intact, no ulceration, erythema , scaling or rash noted. Pigmentation normal throughout  Psych; Normal mood and affect. Judgement and concentration normal   Assessment & Plan:  Encounter for Medicare annual examination with abnormal findings Annual exam as documented. Counseling done  re healthy lifestyle involving commitment to 150 minutes exercise per week, heart healthy diet, and attaining healthy weight.The importance of adequate sleep also discussed. Regular seat belt use and home safety, is also discussed. Changes in health habits are decided on by the patient with goals and time frames  set for achieving them. Immunization and cancer screening needs are specifically addressed at this visit.   Right hip pain 63-monthhistory: No inciting trauma.  Referred to orthopedics for further evaluation.  Exam at visit is within normal.  Change in stool caliber 850-monthistory of change in stool caliber with stringy stool noted at least once per week.  Patient has a history of colon polyps and is due for her colonoscopy later this year.  I will refer to GI.  No history of abdominal pain visible blood in the stool or black stool.

## 2022-04-17 NOTE — Patient Instructions (Addendum)
Follow-up in 6 months, call if you need me sooner.  Please get fasting lipid CMP and EGFR CBC TSH and vitamin D 3 to 5 days prior to your next appointment with me.  You are referred to Dr. Aline Brochure regarding hip pain.  You are referred to GI regarding change in bowel movements.  I do recommend you clarify with your ophthalmologist whether or not it is important for you to have your cataract surgery sooner rather than later to save your vision.  The shingles vaccines and COVID-vaccine current on recommended and these are available at your pharmacy.  Thanks for choosing [facility], we consider it a privelige to serve you.

## 2022-04-17 NOTE — Assessment & Plan Note (Signed)
49-monthhistory of change in stool caliber with stringy stool noted at least once per week.  Patient has a history of colon polyps and is due for her colonoscopy later this year.  I will refer to GI.  No history of abdominal pain visible blood in the stool or black stool.

## 2022-04-18 ENCOUNTER — Encounter (INDEPENDENT_AMBULATORY_CARE_PROVIDER_SITE_OTHER): Payer: Self-pay | Admitting: *Deleted

## 2022-04-25 ENCOUNTER — Ambulatory Visit (INDEPENDENT_AMBULATORY_CARE_PROVIDER_SITE_OTHER): Payer: Medicare Other | Admitting: Orthopedic Surgery

## 2022-04-25 ENCOUNTER — Ambulatory Visit (INDEPENDENT_AMBULATORY_CARE_PROVIDER_SITE_OTHER): Payer: Medicare Other

## 2022-04-25 ENCOUNTER — Encounter: Payer: Self-pay | Admitting: Orthopedic Surgery

## 2022-04-25 VITALS — BP 124/81 | Ht 62.0 in | Wt 139.0 lb

## 2022-04-25 DIAGNOSIS — M25551 Pain in right hip: Secondary | ICD-10-CM

## 2022-04-25 DIAGNOSIS — M2170 Unequal limb length (acquired), unspecified site: Secondary | ICD-10-CM | POA: Diagnosis not present

## 2022-04-25 DIAGNOSIS — M4126 Other idiopathic scoliosis, lumbar region: Secondary | ICD-10-CM

## 2022-04-25 DIAGNOSIS — M545 Low back pain, unspecified: Secondary | ICD-10-CM

## 2022-04-25 NOTE — Patient Instructions (Signed)
Physical therapy has been ordered for you at [facility] They should call you to schedule, [phone]  is the phone number to call, if you want to call to schedule.   

## 2022-04-25 NOTE — Progress Notes (Signed)
Chief Complaint  Patient presents with   Hip Pain    Right     New patient evaluation  Referral from Dr. Tula Nakayama  The patient has not had any imaging and is just taking Tylenol for pain  She is 77 years old she could presents with several week history of right hip pain on the lateral side of her pelvis radiates down the anterolateral side of the right thigh.  Denies any groin pain.  She is status post discectomy 20 years ago by Dr. Joya Salm  She takes Tylenol for pain intermittently  Review of Systems  Cardiovascular:  Positive for palpitations.  Musculoskeletal:  Positive for joint pain.     Physical Exam Vitals and nursing note reviewed.  Constitutional:      Appearance: Normal appearance.  HENT:     Head: Normocephalic and atraumatic.  Eyes:     General: No scleral icterus.       Right eye: No discharge.        Left eye: No discharge.     Extraocular Movements: Extraocular movements intact.     Conjunctiva/sclera: Conjunctivae normal.     Pupils: Pupils are equal, round, and reactive to light.  Cardiovascular:     Rate and Rhythm: Normal rate.     Pulses: Normal pulses.  Musculoskeletal:     Comments: Leg lengths are unequal the left is shorter than the right  Both hips have normal flexion extension rotation  The only tenderness that we were able to elicit was at the lower right side of the bar spine  Skin:    General: Skin is warm and dry.     Capillary Refill: Capillary refill takes less than 2 seconds.  Neurological:     General: No focal deficit present.     Mental Status: She is alert and oriented to person, place, and time.     Gait: Gait normal.  Psychiatric:        Mood and Affect: Mood normal.        Behavior: Behavior normal.        Thought Content: Thought content normal.        Judgment: Judgment normal.    Imaging  X-ray report  Chief complaint right hip pain  Images 3 views lumbar spine AP lateral and spot  Reading: The most  notable immediate finding is the dextroscoliosis of the lumbar spine  There is also notable anterior lipping of the vertebrae with slight narrowing of the facet joints between L2-S1 facet osteophytes are also seen as well.  There is 1 area to space narrowing presumably from the prior discectomy  Impression: Spondylosis with degenerative scoliosis lumbar spine  Assessment and plan 77 year old female status post discectomy 20 years ago presents with right lower back pain radiating into her right hip and thigh with normal hip x-rays and normal hip exam  Most likely etiology is facet arthritis   Treatment options include Tylenol, NSAIDs.  Physical therapy.  No surgical indications at this time  After discussion of the side effects of the anti-inflammatories the patient has opted to take Tylenol Extra Strength as needed and do 4 weeks of physical therapy  Follow-up if no improvement

## 2022-05-02 DIAGNOSIS — M545 Low back pain, unspecified: Secondary | ICD-10-CM | POA: Diagnosis not present

## 2022-05-02 DIAGNOSIS — M2569 Stiffness of other specified joint, not elsewhere classified: Secondary | ICD-10-CM | POA: Diagnosis not present

## 2022-05-02 DIAGNOSIS — M62551 Muscle wasting and atrophy, not elsewhere classified, right thigh: Secondary | ICD-10-CM | POA: Diagnosis not present

## 2022-05-02 DIAGNOSIS — M25551 Pain in right hip: Secondary | ICD-10-CM | POA: Diagnosis not present

## 2022-05-07 DIAGNOSIS — M545 Low back pain, unspecified: Secondary | ICD-10-CM | POA: Diagnosis not present

## 2022-05-07 DIAGNOSIS — M25551 Pain in right hip: Secondary | ICD-10-CM | POA: Diagnosis not present

## 2022-05-07 DIAGNOSIS — M2569 Stiffness of other specified joint, not elsewhere classified: Secondary | ICD-10-CM | POA: Diagnosis not present

## 2022-05-07 DIAGNOSIS — M62551 Muscle wasting and atrophy, not elsewhere classified, right thigh: Secondary | ICD-10-CM | POA: Diagnosis not present

## 2022-05-08 DIAGNOSIS — K080 Exfoliation of teeth due to systemic causes: Secondary | ICD-10-CM | POA: Diagnosis not present

## 2022-05-09 DIAGNOSIS — M62551 Muscle wasting and atrophy, not elsewhere classified, right thigh: Secondary | ICD-10-CM | POA: Diagnosis not present

## 2022-05-09 DIAGNOSIS — M25551 Pain in right hip: Secondary | ICD-10-CM | POA: Diagnosis not present

## 2022-05-09 DIAGNOSIS — M2569 Stiffness of other specified joint, not elsewhere classified: Secondary | ICD-10-CM | POA: Diagnosis not present

## 2022-05-09 DIAGNOSIS — M545 Low back pain, unspecified: Secondary | ICD-10-CM | POA: Diagnosis not present

## 2022-05-14 DIAGNOSIS — M62551 Muscle wasting and atrophy, not elsewhere classified, right thigh: Secondary | ICD-10-CM | POA: Diagnosis not present

## 2022-05-14 DIAGNOSIS — M545 Low back pain, unspecified: Secondary | ICD-10-CM | POA: Diagnosis not present

## 2022-05-14 DIAGNOSIS — M2569 Stiffness of other specified joint, not elsewhere classified: Secondary | ICD-10-CM | POA: Diagnosis not present

## 2022-05-14 DIAGNOSIS — M25551 Pain in right hip: Secondary | ICD-10-CM | POA: Diagnosis not present

## 2022-05-17 ENCOUNTER — Encounter: Payer: Self-pay | Admitting: Radiology

## 2022-05-24 DIAGNOSIS — M545 Low back pain, unspecified: Secondary | ICD-10-CM | POA: Diagnosis not present

## 2022-05-24 DIAGNOSIS — M2569 Stiffness of other specified joint, not elsewhere classified: Secondary | ICD-10-CM | POA: Diagnosis not present

## 2022-05-24 DIAGNOSIS — M25551 Pain in right hip: Secondary | ICD-10-CM | POA: Diagnosis not present

## 2022-05-24 DIAGNOSIS — M62551 Muscle wasting and atrophy, not elsewhere classified, right thigh: Secondary | ICD-10-CM | POA: Diagnosis not present

## 2022-05-28 DIAGNOSIS — M62551 Muscle wasting and atrophy, not elsewhere classified, right thigh: Secondary | ICD-10-CM | POA: Diagnosis not present

## 2022-05-28 DIAGNOSIS — M2569 Stiffness of other specified joint, not elsewhere classified: Secondary | ICD-10-CM | POA: Diagnosis not present

## 2022-05-28 DIAGNOSIS — M25551 Pain in right hip: Secondary | ICD-10-CM | POA: Diagnosis not present

## 2022-05-28 DIAGNOSIS — M545 Low back pain, unspecified: Secondary | ICD-10-CM | POA: Diagnosis not present

## 2022-05-30 DIAGNOSIS — M2569 Stiffness of other specified joint, not elsewhere classified: Secondary | ICD-10-CM | POA: Diagnosis not present

## 2022-05-30 DIAGNOSIS — M545 Low back pain, unspecified: Secondary | ICD-10-CM | POA: Diagnosis not present

## 2022-05-30 DIAGNOSIS — M25551 Pain in right hip: Secondary | ICD-10-CM | POA: Diagnosis not present

## 2022-05-30 DIAGNOSIS — M62551 Muscle wasting and atrophy, not elsewhere classified, right thigh: Secondary | ICD-10-CM | POA: Diagnosis not present

## 2022-06-05 DIAGNOSIS — M25551 Pain in right hip: Secondary | ICD-10-CM | POA: Diagnosis not present

## 2022-06-05 DIAGNOSIS — M2569 Stiffness of other specified joint, not elsewhere classified: Secondary | ICD-10-CM | POA: Diagnosis not present

## 2022-06-05 DIAGNOSIS — M62551 Muscle wasting and atrophy, not elsewhere classified, right thigh: Secondary | ICD-10-CM | POA: Diagnosis not present

## 2022-06-05 DIAGNOSIS — M545 Low back pain, unspecified: Secondary | ICD-10-CM | POA: Diagnosis not present

## 2022-06-07 DIAGNOSIS — M62551 Muscle wasting and atrophy, not elsewhere classified, right thigh: Secondary | ICD-10-CM | POA: Diagnosis not present

## 2022-06-07 DIAGNOSIS — M25551 Pain in right hip: Secondary | ICD-10-CM | POA: Diagnosis not present

## 2022-06-07 DIAGNOSIS — M2569 Stiffness of other specified joint, not elsewhere classified: Secondary | ICD-10-CM | POA: Diagnosis not present

## 2022-06-07 DIAGNOSIS — M545 Low back pain, unspecified: Secondary | ICD-10-CM | POA: Diagnosis not present

## 2022-06-12 DIAGNOSIS — M62551 Muscle wasting and atrophy, not elsewhere classified, right thigh: Secondary | ICD-10-CM | POA: Diagnosis not present

## 2022-06-12 DIAGNOSIS — M25551 Pain in right hip: Secondary | ICD-10-CM | POA: Diagnosis not present

## 2022-06-12 DIAGNOSIS — M2569 Stiffness of other specified joint, not elsewhere classified: Secondary | ICD-10-CM | POA: Diagnosis not present

## 2022-06-12 DIAGNOSIS — M545 Low back pain, unspecified: Secondary | ICD-10-CM | POA: Diagnosis not present

## 2022-06-15 DIAGNOSIS — M25551 Pain in right hip: Secondary | ICD-10-CM | POA: Diagnosis not present

## 2022-06-15 DIAGNOSIS — M2569 Stiffness of other specified joint, not elsewhere classified: Secondary | ICD-10-CM | POA: Diagnosis not present

## 2022-06-15 DIAGNOSIS — M545 Low back pain, unspecified: Secondary | ICD-10-CM | POA: Diagnosis not present

## 2022-06-15 DIAGNOSIS — M62551 Muscle wasting and atrophy, not elsewhere classified, right thigh: Secondary | ICD-10-CM | POA: Diagnosis not present

## 2022-06-18 ENCOUNTER — Other Ambulatory Visit: Payer: Self-pay

## 2022-06-18 ENCOUNTER — Encounter (INDEPENDENT_AMBULATORY_CARE_PROVIDER_SITE_OTHER): Payer: Self-pay | Admitting: Gastroenterology

## 2022-06-18 ENCOUNTER — Ambulatory Visit (INDEPENDENT_AMBULATORY_CARE_PROVIDER_SITE_OTHER): Payer: Medicare Other | Admitting: Gastroenterology

## 2022-06-18 VITALS — BP 144/76 | HR 65 | Temp 99.1°F | Ht 62.0 in | Wt 138.6 lb

## 2022-06-18 DIAGNOSIS — Z8619 Personal history of other infectious and parasitic diseases: Secondary | ICD-10-CM | POA: Diagnosis not present

## 2022-06-18 DIAGNOSIS — K5900 Constipation, unspecified: Secondary | ICD-10-CM | POA: Diagnosis not present

## 2022-06-18 DIAGNOSIS — R772 Abnormality of alphafetoprotein: Secondary | ICD-10-CM

## 2022-06-18 NOTE — Patient Instructions (Addendum)
We will update AFP tumor marker given your history of hepatitis Continue with good water intake, aim for 64 oz per day, continue with dietary changes with plenty of fruits, veggies and whole grains You are due for colonoscopy in August 2024, we will reach out closer to time to schedule you for this.   Follow up 6 months

## 2022-06-18 NOTE — Progress Notes (Addendum)
Referring Provider: Fayrene Helper, MD Primary Care Physician:  Fayrene Helper, MD Primary GI Physician: Jenetta Downer   Chief Complaint  Patient presents with   change in stool caliber    Referred for change in stool caliber. Was more constipated and stools were like pebbles. Reports since the referral her stools have went back normal. Was eating more red meat at the time her stool changed.    HPI:   Christina Miles is a 77 y.o. female with past medical history of history of hepatitis C, HTN, and depression.   Patient presenting today for change in stool caliber  Last seen September 2023, at that time doing well, regular BMs, no GI complaints.  AFP at that time was 13.7 (previously baseline elevated 15-17 range for the past few years) Recommended to update RUQ [place] with elastography, schedule colonoscopy in 2024.   RUQ [place] elastography in September 2023, with median kPa 4.7 or changes from previous liver imaging, recommended to repeat AFP tumor marker in 6 months (April 2024)  TSh 1.18, CMP WNL September 2023  Present:  Patient states she had constipation back in January when she saw PCP who felt she should see GI. She was having a BM every other day to every 2 days. Stools were hard and more like pebbles/hard stool balls. She states that at that time she was eating a lot of red meat which was newer for her. She stopped doing this and increased her fruits, veggies, doing baked chicken and fish, bowel habits improved. She is now going 1-2x/day with soft but formed stools. Denies abdominal pain, rectal bleeding or melena. Water intake is around 32 oz per day. No weight loss or changes in appetite.   Last Colonoscopy:August 2019: Small polyp at splenic flexure and distal sigmoid colon, external hemorrhoids. Path showed tubular adenoma. Next colonoscopy due 10/2022.   Recommendations:    Past Medical History:  Diagnosis Date   Arthritis    Not sure of date first noted    Cataract    Depression 2014   Encounter for Medicare annual examination with abnormal findings 04/17/2022   Glaucoma 2018   Hepatitis C    Hypertension    Reduced vision 2015   right, had hemmorhage, treated by [name], injections    Past Surgical History:  Procedure Laterality Date   ABDOMINAL HYSTERECTOMY  1985   . ? mass on ovary   APPENDECTOMY     BACK SURGERY     CHOLECYSTECTOMY     COLONOSCOPY  02/20/2012   Procedure: COLONOSCOPY;  Surgeon: Rogene Houston, MD;  [facility]: [facility];  Service: Endoscopy;  Laterality: N/A;  930   COLONOSCOPY     Patient states that this was 3-4 years ago @[facility]    COLONOSCOPY N/A 10/24/2017   [place]: small polyp at splenic flexure and distal sigmoid colon, external hemorrhoids. (Tubular adenoma)   EYE SURGERY  2019   GLAUCOMA SURGERY     05/2017   POLYPECTOMY  10/24/2017   Procedure: POLYPECTOMY;  Surgeon: Rogene Houston, MD;  Location: [facility];  Service: Endoscopy;;  colon   SPINE SURGERY  1998 approx   [place]    Current Outpatient Medications  Medication Sig Dispense Refill   amLODipine (NORVASC) 5 MG tablet Take 1 tablet (5 mg total) by mouth daily. 90 tablet 3   brimonidine-timolol (COMBIGAN) 0.2-0.5 % ophthalmic solution Place 1 drop into the left eye every 12 (twelve) hours.     Calcium Carbonate-Vitamin  D 600-400 MG-UNIT tablet Take 2 tablets by mouth daily.     fluticasone (FLONASE) 50 MCG/ACT nasal spray Place 2 sprays into both nostrils daily. 16 g 6   hydrochlorothiazide (HYDRODIURIL) 25 MG tablet Take 1 tablet by mouth once daily for blood pressure 90 tablet 3   latanoprost (XALATAN) 0.005 % ophthalmic solution Place 1 drop into the left eye at bedtime.     mirtazapine (REMERON) 7.5 MG tablet Take 1 tablet (7.5 mg total) by mouth at bedtime. 30 tablet 5   potassium chloride (KLOR-CON) 10 MEQ tablet TAKE (1) TABLET BY MOUTH TWICE DAILY. 180 tablet 3   No current facility-administered medications for this visit.     Allergies as of 06/18/2022 - Review Complete 06/18/2022  Allergen Reaction Noted   Ace inhibitors Cough 10/23/2011    Family History  Problem Relation Age of Onset   Colon cancer Father    Dementia Mother    Diabetes Brother    Hypertension Brother    Hypertension Brother     Social History   Socioeconomic History   Marital status: Married    Spouse name: Not on file   Number of children: Not on file   Years of education: Not on file   Highest education level: Not on file  Occupational History   Not on file  Tobacco Use   Smoking status: Former    Packs/day: 1.00    Years: 10.00    Additional pack years: 0.00    Total pack years: 10.00    Types: Cigarettes    Quit date: 12/01/1990    Years since quitting: 31.5    Passive exposure: Current   Smokeless tobacco: Never  Vaping Use   Vaping Use: Never used  Substance and Sexual Activity   Alcohol use: No   Drug use: No   Sexual activity: Yes  Other Topics Concern   Not on file  Social History Narrative   Not on file   Social Determinants of Health   Financial Resource Strain: Low Risk  (08/28/2021)   Overall Financial Resource Strain (CARDIA)    Difficulty of Paying Living Expenses: Not hard at all  Food Insecurity: No Food Insecurity (08/28/2021)   Hunger Vital Sign    Worried About Running Out of Food in the Last Year: Never true    Ran Out of Food in the Last Year: Never true  Transportation Needs: No Transportation Needs (08/28/2021)   PRAPARE - Hydrologist (Medical): No    Lack of Transportation (Non-Medical): No  Physical Activity: Insufficiently Active (08/28/2021)   Exercise Vital Sign    Days of Exercise per Week: 3 days    Minutes of Exercise per Session: 20 min  Stress: No Stress Concern Present (08/17/2020)   [place]    Feeling of Stress : Not at all  Social Connections: Moderately Integrated  (08/28/2021)   Social Connection and Isolation Panel [NHANES]    Frequency of Communication with Friends and Family: Three times a week    Frequency of Social Gatherings with Friends and Family: More than three times a week    Attends Religious Services: More than 4 times per year    Active Member of [employer] or Organizations: No    Attends Archivist Meetings: Never    Marital Status: Married    Review of systems General: negative for malaise, night sweats, fever, chills, weight loss Neck: Negative for  lumps, goiter, pain and significant neck swelling Resp: Negative for cough, wheezing, dyspnea at rest CV: Negative for chest pain, leg swelling, palpitations, orthopnea GI: denies melena, hematochezia, nausea, vomiting, diarrhea, constipation, dysphagia, odyonophagia, early satiety or unintentional weight loss.  MSK: Negative for joint pain or swelling, back pain, and muscle pain. Derm: Negative for itching or rash Psych: Denies depression, anxiety, memory loss, confusion. No homicidal or suicidal ideation.  Heme: Negative for prolonged bleeding, bruising easily, and swollen nodes. Endocrine: Negative for cold or heat intolerance, polyuria, polydipsia and goiter. Neuro: negative for tremor, gait imbalance, syncope and seizures. The remainder of the review of systems is noncontributory.  Physical Exam: BP (!) 144/76   Pulse 65   Temp 99.1 F (37.3 C) (Oral)   Ht 5\' 2"  (1.575 m)   Wt 138 lb 9.6 oz (62.9 kg)   BMI 25.35 kg/m  General:   Alert and oriented. No distress noted. Pleasant and cooperative.  Head:  Normocephalic and atraumatic. Eyes:  Conjuctiva clear without scleral icterus. Mouth:  Oral mucosa pink and moist. Good dentition. No lesions. Heart: Normal rate and rhythm, s1 and s2 heart sounds present.  Lungs: Clear lung sounds in all lobes. Respirations equal and unlabored. Abdomen:  +BS, soft, non-tender and non-distended. No rebound or guarding. No HSM or masses  noted. Derm: No palmar erythema or jaundice Msk:  Symmetrical without gross deformities. Normal posture. Extremities:  Without edema. Neurologic:  Alert and  oriented x4 Psych:  Alert and cooperative. Normal mood and affect.  Invalid input(s): "6 MONTHS"   ASSESSMENT: TAEYA GARRETTE is a 77 y.o. female presenting today for constipation/change in stool habit  Constipation with harder stool balls back in January, constipation resolved with elimination of Red meat and increase in fruits, veggies and baked poultry. She is having 1-2 soft but formed BMs per day. Fairly recent TSh and CMP WNL. Suspect constipation was secondary to diet as this improved with implementation of high fiber and leaner protein. She has no current GI complaints. Encouraged to continue with good water intake, aim for atleast 64 oz per day and continue with dietary changes. She is due for colonoscopy in August 2024 for history of colonic adenomas, will make sure we have her on the recall list to get this scheduled closer to time.  History of Hepatitis C: SVR achieved. imaging in September 2023 via elastography most consistent with being normal. AFP at that time was around her baseline 13.7, though has been elevated as high as 17 in the past, reassuringly never above 20, CT liver in sept 2022 done as MRI was not covered by insurance, was normal.  She is due for repeat AFP this month. She will go to the lab to have this drawn.     PLAN:  AFP tumor marker  2. Increase water intake, aim for atleast 64 oz per day Increase fruits, veggies and whole grains, kiwi and prunes are especially good for constipation 3. Colonoscopy due in August 2024   All questions were answered, patient verbalized understanding and is in agreement with plan as outlined above.   Follow Up: 6 months  Rasa Degrazia L. [place], MSN, APRN, AGNP-C Adult-Gerontology Nurse Practitioner [facility]  I have reviewed the note and agree  with the APP's assessment as described in this progress note  It is unclear to me why she has been having AFP and [place] screening with imaging as she never had advanced liver fibrosis.Will repeat AFP but if <15  will no pursue [place] screening anymore.  Maylon Peppers, MD Gastroenterology and Hepatology [facility]

## 2022-06-21 DIAGNOSIS — M2569 Stiffness of other specified joint, not elsewhere classified: Secondary | ICD-10-CM | POA: Diagnosis not present

## 2022-06-21 DIAGNOSIS — M25551 Pain in right hip: Secondary | ICD-10-CM | POA: Diagnosis not present

## 2022-06-21 DIAGNOSIS — M62551 Muscle wasting and atrophy, not elsewhere classified, right thigh: Secondary | ICD-10-CM | POA: Diagnosis not present

## 2022-06-21 DIAGNOSIS — M5451 Vertebrogenic low back pain: Secondary | ICD-10-CM | POA: Diagnosis not present

## 2022-06-22 DIAGNOSIS — Z8719 Personal history of other diseases of the digestive system: Secondary | ICD-10-CM | POA: Diagnosis not present

## 2022-06-22 DIAGNOSIS — Z8619 Personal history of other infectious and parasitic diseases: Secondary | ICD-10-CM | POA: Diagnosis not present

## 2022-06-22 DIAGNOSIS — K7460 Unspecified cirrhosis of liver: Secondary | ICD-10-CM | POA: Diagnosis not present

## 2022-06-25 DIAGNOSIS — M2569 Stiffness of other specified joint, not elsewhere classified: Secondary | ICD-10-CM | POA: Diagnosis not present

## 2022-06-25 DIAGNOSIS — M5451 Vertebrogenic low back pain: Secondary | ICD-10-CM | POA: Diagnosis not present

## 2022-06-25 DIAGNOSIS — M25551 Pain in right hip: Secondary | ICD-10-CM | POA: Diagnosis not present

## 2022-06-25 DIAGNOSIS — M62551 Muscle wasting and atrophy, not elsewhere classified, right thigh: Secondary | ICD-10-CM | POA: Diagnosis not present

## 2022-06-25 LAB — AFP TUMOR MARKER: AFP-Tumor Marker: 15.1 ng/mL — ABNORMAL HIGH

## 2022-06-28 DIAGNOSIS — M62551 Muscle wasting and atrophy, not elsewhere classified, right thigh: Secondary | ICD-10-CM | POA: Diagnosis not present

## 2022-06-28 DIAGNOSIS — M2569 Stiffness of other specified joint, not elsewhere classified: Secondary | ICD-10-CM | POA: Diagnosis not present

## 2022-06-28 DIAGNOSIS — M25551 Pain in right hip: Secondary | ICD-10-CM | POA: Diagnosis not present

## 2022-06-28 DIAGNOSIS — M5451 Vertebrogenic low back pain: Secondary | ICD-10-CM | POA: Diagnosis not present

## 2022-07-04 DIAGNOSIS — M5451 Vertebrogenic low back pain: Secondary | ICD-10-CM | POA: Diagnosis not present

## 2022-07-04 DIAGNOSIS — M25551 Pain in right hip: Secondary | ICD-10-CM | POA: Diagnosis not present

## 2022-07-04 DIAGNOSIS — M2569 Stiffness of other specified joint, not elsewhere classified: Secondary | ICD-10-CM | POA: Diagnosis not present

## 2022-07-04 DIAGNOSIS — M62551 Muscle wasting and atrophy, not elsewhere classified, right thigh: Secondary | ICD-10-CM | POA: Diagnosis not present

## 2022-07-06 DIAGNOSIS — M25551 Pain in right hip: Secondary | ICD-10-CM | POA: Diagnosis not present

## 2022-07-06 DIAGNOSIS — M62551 Muscle wasting and atrophy, not elsewhere classified, right thigh: Secondary | ICD-10-CM | POA: Diagnosis not present

## 2022-07-06 DIAGNOSIS — M2569 Stiffness of other specified joint, not elsewhere classified: Secondary | ICD-10-CM | POA: Diagnosis not present

## 2022-07-06 DIAGNOSIS — M5451 Vertebrogenic low back pain: Secondary | ICD-10-CM | POA: Diagnosis not present

## 2022-07-10 DIAGNOSIS — M5451 Vertebrogenic low back pain: Secondary | ICD-10-CM | POA: Diagnosis not present

## 2022-07-10 DIAGNOSIS — M62551 Muscle wasting and atrophy, not elsewhere classified, right thigh: Secondary | ICD-10-CM | POA: Diagnosis not present

## 2022-07-10 DIAGNOSIS — M2569 Stiffness of other specified joint, not elsewhere classified: Secondary | ICD-10-CM | POA: Diagnosis not present

## 2022-07-10 DIAGNOSIS — M25551 Pain in right hip: Secondary | ICD-10-CM | POA: Diagnosis not present

## 2022-07-12 DIAGNOSIS — M62551 Muscle wasting and atrophy, not elsewhere classified, right thigh: Secondary | ICD-10-CM | POA: Diagnosis not present

## 2022-07-12 DIAGNOSIS — M25551 Pain in right hip: Secondary | ICD-10-CM | POA: Diagnosis not present

## 2022-07-12 DIAGNOSIS — M2569 Stiffness of other specified joint, not elsewhere classified: Secondary | ICD-10-CM | POA: Diagnosis not present

## 2022-07-12 DIAGNOSIS — M5451 Vertebrogenic low back pain: Secondary | ICD-10-CM | POA: Diagnosis not present

## 2022-07-25 DIAGNOSIS — H04123 Dry eye syndrome of bilateral lacrimal glands: Secondary | ICD-10-CM | POA: Diagnosis not present

## 2022-07-25 DIAGNOSIS — H2511 Age-related nuclear cataract, right eye: Secondary | ICD-10-CM | POA: Diagnosis not present

## 2022-07-25 DIAGNOSIS — H401121 Primary open-angle glaucoma, left eye, mild stage: Secondary | ICD-10-CM | POA: Diagnosis not present

## 2022-07-25 DIAGNOSIS — H401112 Primary open-angle glaucoma, right eye, moderate stage: Secondary | ICD-10-CM | POA: Diagnosis not present

## 2022-07-25 DIAGNOSIS — H348312 Tributary (branch) retinal vein occlusion, right eye, stable: Secondary | ICD-10-CM | POA: Diagnosis not present

## 2022-07-27 DIAGNOSIS — H2511 Age-related nuclear cataract, right eye: Secondary | ICD-10-CM | POA: Diagnosis not present

## 2022-07-31 DIAGNOSIS — H2511 Age-related nuclear cataract, right eye: Secondary | ICD-10-CM | POA: Diagnosis not present

## 2022-09-05 ENCOUNTER — Ambulatory Visit (INDEPENDENT_AMBULATORY_CARE_PROVIDER_SITE_OTHER): Payer: Medicare Other

## 2022-09-05 VITALS — BP 128/78 | Ht 62.5 in | Wt 139.0 lb

## 2022-09-05 DIAGNOSIS — Z0000 Encounter for general adult medical examination without abnormal findings: Secondary | ICD-10-CM | POA: Diagnosis not present

## 2022-09-05 NOTE — Progress Notes (Signed)
 Subjective:   Christina Miles is a 77 y.o. female who presents for Medicare Annual (Subsequent) preventive examination.  Visit Complete: Virtual  I connected with  Christina Miles on 09/05/22 by a audio enabled telemedicine application and verified that I am speaking with the correct person using two identifiers.  Patient Location: Home  Provider Location: Home Office  I discussed the limitations of evaluation and management by telemedicine. The patient expressed understanding and agreed to proceed.  Patient Medicare AWV questionnaire was completed by the patient on 08/31/2022; I have confirmed that all information answered by patient is correct and no changes since this date.  Review of Systems     Cardiac Risk Factors include: advanced age (>78men, >22 women);dyslipidemia;hypertension     Objective:    Today's Vitals   09/05/22 0836  BP: 128/78  Weight: 139 lb (63 kg)  Height: 5' 2.5" (1.588 m)   Body mass index is 25.02 kg/m.     09/05/2022    8:45 AM 08/28/2021    9:34 AM 08/17/2020    1:09 PM 10/20/2019    3:30 PM 10/24/2017   11:29 AM 02/20/2012    8:31 AM  Advanced Directives  Does Patient Have a Medical Advance Directive? No No No No No Patient does not have advance directive;Patient would like information  Does patient want to make changes to medical advance directive?   No - Patient declined     Would patient like information on creating a medical advance directive? No - Patient declined Yes (ED - Information included in AVS)  Yes (MAU/Ambulatory/Procedural Areas - Information given) Yes (MAU/Ambulatory/Procedural Areas - Information given) Advance directive packet given  Pre-existing out of facility DNR order (yellow form or pink MOST form)      No    Current Medications (verified) Outpatient Encounter Medications as of 09/05/2022  Medication Sig   amLODipine (NORVASC) 5 MG tablet Take 1 tablet (5 mg total) by mouth daily.   brimonidine-timolol (COMBIGAN)  0.2-0.5 % ophthalmic solution Place 1 drop into the left eye every 12 (twelve) hours.   Calcium Carbonate-Vitamin D 600-400 MG-UNIT tablet Take 2 tablets by mouth daily.   fluticasone (FLONASE) 50 MCG/ACT nasal spray Place 2 sprays into both nostrils daily.   hydrochlorothiazide (HYDRODIURIL) 25 MG tablet Take 1 tablet by mouth once daily for blood pressure   latanoprost (XALATAN) 0.005 % ophthalmic solution Place 1 drop into the left eye at bedtime.   mirtazapine (REMERON) 7.5 MG tablet Take 1 tablet (7.5 mg total) by mouth at bedtime.   potassium chloride (KLOR-CON) 10 MEQ tablet TAKE (1) TABLET BY MOUTH TWICE DAILY.   No facility-administered encounter medications on file as of 09/05/2022.    Allergies (verified) Ace inhibitors   History: Past Medical History:  Diagnosis Date   Arthritis    Not sure of date first noted   Cataract    Depression 2014   Encounter for Medicare annual examination with abnormal findings 04/17/2022   Glaucoma 2018   Hepatitis C    Hypertension    Reduced vision 2015   right, had hemmorhage, treated by [facility], injections   Past Surgical History:  Procedure Laterality Date   ABDOMINAL HYSTERECTOMY  1985   . ? mass on ovary   APPENDECTOMY     BACK SURGERY     CHOLECYSTECTOMY     COLONOSCOPY  02/20/2012   Procedure: COLONOSCOPY;  Surgeon: Malissa Hippo, MD;  [facility]: [facility];  Service: Endoscopy;  Laterality:  N/A;  930   COLONOSCOPY     Patient states that this was 3-4 years ago @[facility]    COLONOSCOPY N/A 10/24/2017   Rehman: small polyp at splenic flexure and distal sigmoid colon, external hemorrhoids. (Tubular adenoma)   EYE SURGERY  2019   GLAUCOMA SURGERY     05/2017   POLYPECTOMY  10/24/2017   Procedure: POLYPECTOMY;  Surgeon: Malissa Hippo, MD;  Location: [facility];  Service: Endoscopy;;  colon   SPINE SURGERY  1998 approx   [place]   Family History  Problem Relation Age of Onset   Colon cancer Father    Cancer Father     Dementia Mother    Diabetes Brother    Hypertension Brother    Hypertension Brother    Social History   Socioeconomic History   Marital status: Married    Spouse name: Not on file   Number of children: Not on file   Years of education: Not on file   Highest education level: Not on file  Occupational History   Not on file  Tobacco Use   Smoking status: Former    Packs/day: 1.00    Years: 10.00    Additional pack years: 0.00    Total pack years: 10.00    Types: Cigarettes    Quit date: 12/01/1990    Years since quitting: 31.7    Passive exposure: Current   Smokeless tobacco: Never  Vaping Use   Vaping Use: Never used  Substance and Sexual Activity   Alcohol use: No   Drug use: No   Sexual activity: Yes  Other Topics Concern   Not on file  Social History Narrative   Not on file   Social Determinants of Health   Financial Resource Strain: Low Risk  (09/05/2022)   Overall Financial Resource Strain (CARDIA)    Difficulty of Paying Living Expenses: Not hard at all  Food Insecurity: No Food Insecurity (09/05/2022)   Hunger Vital Sign    Worried About Running Out of Food in the Last Year: Never true    Ran Out of Food in the Last Year: Never true  Transportation Needs: No Transportation Needs (09/05/2022)   PRAPARE - Administrator, Civil Service (Medical): No    Lack of Transportation (Non-Medical): No  Physical Activity: Insufficiently Active (09/05/2022)   Exercise Vital Sign    Days of Exercise per Week: 4 days    Minutes of Exercise per Session: 20 min  Stress: No Stress Concern Present (09/05/2022)   [employer] of Occupational Health - Occupational Stress Questionnaire    Feeling of Stress : Not at all  Social Connections: Socially Integrated (09/05/2022)   Social Connection and Isolation Panel [NHANES]    Frequency of Communication with Friends and Family: Twice a week    Frequency of Social Gatherings with Friends and Family: Twice a week     Attends Religious Services: More than 4 times per year    Active Member of [employer] or Organizations: Yes    Attends Engineer, structural: More than 4 times per year    Marital Status: Married    Tobacco Counseling Counseling given: Yes   Clinical Intake:  Pre-visit preparation completed: Yes  Pain : No/denies pain     BMI - recorded: 25.02 Nutritional Status: BMI 25 -29 Overweight Nutritional Risks: None Diabetes: No  How often do you need to have someone help you when you read instructions, pamphlets, or other written materials from  your doctor or pharmacy?: 1 - Never  Interpreter Needed?: No  Information entered by ::  Teghan Philbin CMA   Activities of Daily Living    09/05/2022    8:47 AM 09/04/2022    8:57 PM  In your present state of health, do you have any difficulty performing the following activities:  Hearing? 0 0  Vision? 0 0  Difficulty concentrating or making decisions? 0 0  Walking or climbing stairs? 0 0  Dressing or bathing? 0 0  Doing errands, shopping? 0 0  Preparing Food and eating ? N N  Using the Toilet? N N  In the past six months, have you accidently leaked urine? Y Y  Do you have problems with loss of bowel control? N N  Managing your Medications? N N  Managing your Finances? N N  Housekeeping or managing your Housekeeping? N N    Patient Care Team: Kerri Perches, MD as PCP - General Rollene Rotunda, MD as PCP - Cardiology (Cardiology) Sherrie George, MD as Consulting Physician (Ophthalmology)  Indicate any recent Medical Services you may have received from other than [facility] providers in the past year (date may be approximate).     Assessment:   This is a routine wellness examination for [place].  Hearing/Vision screen Hearing Screening - Comments:: Patient denies any hearing difficulties.   Vision Screening - Comments:: Wears rx glasses - up to date with routine eye exams with  Dr. Sallye Lat  Dietary issues  and exercise activities discussed:     Goals Addressed             This Visit's Progress    Patient Stated       Patients goal is to remain as active and healthy as she currently is.        Depression Screen    09/05/2022    8:42 AM 04/17/2022    9:56 AM 04/17/2022    9:55 AM 11/15/2021    9:59 AM 04/12/2021   10:06 AM 09/21/2020    9:15 AM 08/17/2020    1:13 PM  PHQ 2/9 Scores  PHQ - 2 Score 0 1 1 0 0 0 0  PHQ- 9 Score  3         Fall Risk    09/05/2022    8:45 AM 09/04/2022    8:57 PM 04/17/2022    9:55 AM 11/15/2021    9:59 AM 08/28/2021    9:33 AM  Fall Risk   Falls in the past year? 1 1 0 0 0  Number falls in past yr: 0 0 0 0 0  Injury with Fall? 1 1 0 0 0  Risk for fall due to : History of fall(s)  No Fall Risks No Fall Risks   Follow up Falls prevention discussed;Education provided  Falls evaluation completed Falls evaluation completed     MEDICARE RISK AT HOME:  Medicare Risk at Home - 09/05/22 0840     Any stairs in or around the home? Yes    If so, are there any without handrails? No    Home free of loose throw rugs in walkways, pet beds, electrical cords, etc? Yes    Adequate lighting in your home to reduce risk of falls? Yes    Life alert? No    Use of a cane, walker or w/c? No    Grab bars in the bathroom? Yes    Shower chair or bench in shower? Yes    Elevated  toilet seat or a handicapped toilet? Yes             TIMED UP AND GO:  Was the test performed?  No    Cognitive Function:        09/05/2022    8:47 AM 08/28/2021    9:39 AM 07/27/2019    8:18 AM 07/17/2018    2:02 PM  6CIT Screen  What Year? 0 points 0 points 0 points 0 points  What month? 0 points 0 points 0 points 0 points  What time? 0 points 0 points 0 points 0 points  Count back from 20 0 points 0 points 0 points 0 points  Months in reverse 0 points 0 points 0 points 0 points  Repeat phrase 0 points 0 points 0 points 0 points  Total Score 0 points 0 points 0 points 0 points     Immunizations Immunization History  Administered Date(s) Administered   Fluad Quad(high Dose 65+) 12/23/2018, 01/04/2020, 04/12/2021, 02/05/2022   Influenza Split 01/23/2012   Influenza Whole 12/18/2005   Influenza,inj,Quad PF,6+ Mos 12/18/2012, 01/26/2014, 03/15/2015, 01/26/2016, 01/03/2017, 01/07/2018   PFIZER(Purple Top)SARS-COV-2 Vaccination 05/09/2019, 06/01/2019, 12/15/2019, 01/20/2022   Pfizer Covid-19 Vaccine Bivalent Booster 10yrs & up 01/13/2021   Pneumococcal Conjugate-13 10/28/2013   Pneumococcal Polysaccharide-23 12/18/2012   Td 08/09/2003   Tdap 10/05/2014   Zoster Recombinat (Shingrix) 10/26/2021   Zoster, Live 01/23/2012    TDAP status: Up to date  Flu Vaccine status: Up to date  Pneumococcal vaccine status: Up to date  Covid-19 vaccine status: Information provided on how to obtain vaccines.   Qualifies for Shingles Vaccine? Yes   Zostavax completed Yes   Shingrix Completed?: Yes  Screening Tests Health Maintenance  Topic Date Due   Zoster Vaccines- Shingrix (2 of 2) 12/21/2021   COVID-19 Vaccine (6 - 2023-24 season) 03/17/2022   Colonoscopy  10/25/2022   INFLUENZA VACCINE  10/18/2022   MAMMOGRAM  02/06/2023   Medicare Annual Wellness (AWV)  09/05/2023   DTaP/Tdap/Td (3 - Td or Tdap) 10/04/2024   Pneumonia Vaccine 66+ Years old  Completed   DEXA SCAN  Completed   Hepatitis C Screening  Completed   HPV VACCINES  Aged Out    Health Maintenance  Health Maintenance Due  Topic Date Due   Zoster Vaccines- Shingrix (2 of 2) 12/21/2021   COVID-19 Vaccine (6 - 2023-24 season) 03/17/2022   Colonoscopy  10/25/2022    Colorectal cancer screening: Type of screening: Colonoscopy. Completed 10/24/2017. Repeat every 5 years  Mammogram status: Completed 02/05/2022. Repeat every year  Bone Density status: Completed 02/01/2020. Results reflect: Bone density results: OSTEOPENIA. Repeat every 5 years.  Lung Cancer Screening: (Low Dose CT Chest recommended if  Age 90-80 years, 20 pack-year currently smoking OR have quit w/in 15years.) does not qualify.   Additional Screening:  Hepatitis C Screening: does qualify; Completed 06/18/2022  Vision Screening: Recommended annual ophthalmology exams for early detection of glaucoma and other disorders of the eye. Is the patient up to date with their annual eye exam?  Yes  Who is the provider or what is the name of the office in which the patient attends annual eye exams? [facility] If pt is not established with a provider, would they like to be referred to a provider to establish care? No .   Dental Screening: Recommended annual dental exams for proper oral hygiene  Diabetic Foot Exam: NA  Community Resource Referral / Chronic Care Management: CRR required this visit?  No  CCM required this visit?  No     Plan:     I have personally reviewed and noted the following in the patient's chart:   Medical and social history Use of alcohol, tobacco or illicit drugs  Current medications and supplements including opioid prescriptions. Patient is not currently taking opioid prescriptions. Functional ability and status Nutritional status Physical activity Advanced directives List of other physicians Hospitalizations, surgeries, and ER visits in previous 12 months Vitals Screenings to include cognitive, depression, and falls Referrals and appointments  In addition, I have reviewed and discussed with patient certain preventive protocols, quality metrics, and best practice recommendations. A written personalized care plan for preventive services as well as general preventive health recommendations were provided to patient.    Because this visit was a virtual/telehealth visit,  certain criteria was not obtained, such a blood pressure, CBG if patient is a diabetic, and timed up and go.   Jordan Hawks Jahid Weida, CMA   09/05/2022   After Visit Summary: (MyChart) Due to this being a telephonic visit, the  after visit summary with patients personalized plan was offered to patient via MyChart   Nurse Notes:

## 2022-09-05 NOTE — Patient Instructions (Signed)
Christina Miles , Thank you for taking time to come for your Medicare Wellness Visit. I appreciate your ongoing commitment to your health goals. Please review the following plan we discussed and let me know if I can assist you in the future.   These are the goals we discussed:  Goals      DIET - INCREASE WATER INTAKE     Wants to drink more water      LIFESTYLE - DECREASE FALLS RISK     Patient Stated     Patients goal is to remain as active and healthy as she currently is.         This is a list of the screening recommended for you and due dates:  Health Maintenance  Topic Date Due   Zoster (Shingles) Vaccine (2 of 2) 12/21/2021   COVID-19 Vaccine (6 - 2023-24 season) 03/17/2022   Colon Cancer Screening  10/25/2022   Flu Shot  10/18/2022   Mammogram  02/06/2023   Medicare Annual Wellness Visit  09/05/2023   DTaP/Tdap/Td vaccine (3 - Td or Tdap) 10/04/2024   Pneumonia Vaccine  Completed   DEXA scan (bone density measurement)  Completed   Hepatitis C Screening  Completed   HPV Vaccine  Aged Out    Advanced directives: Advance directive discussed with you today. Even though you declined this today, please call our office should you change your mind, and we can give you the proper paperwork for you to fill out. Advance care planning is a way to make decisions about medical care that fits your values in case you are ever unable to make these decisions for yourself.  Information on Advanced Care Planning can be found at [facility] Advance Health Care Directives Advance Health Care Directives ([email or web address]    Conditions/risks identified: Get your Covid booster and second Shingles vaccine at your preferred pharmacy  Next appointment:  Follow up in one year for your annual wellness visit  October 02, 2023 at 830am telephone visit   Preventive Care 65 Years and Older, Female Preventive care refers to lifestyle choices and visits with your health care provider that  can promote health and wellness. What does preventive care include? A yearly physical exam. This is also called an annual well check. Dental exams once or twice a year. Routine eye exams. Ask your health care provider how often you should have your eyes checked. Personal lifestyle choices, including: Daily care of your teeth and gums. Regular physical activity. Eating a healthy diet. Avoiding tobacco and drug use. Limiting alcohol use. Practicing safe sex. Taking low-dose aspirin every day. Taking vitamin and mineral supplements as recommended by your health care provider. What happens during an annual well check? The services and screenings done by your health care provider during your annual well check will depend on your age, overall health, lifestyle risk factors, and family history of disease. Counseling  Your health care provider may ask you questions about your: Alcohol use. Tobacco use. Drug use. Emotional well-being. Home and relationship well-being. Sexual activity. Eating habits. History of falls. Memory and ability to understand (cognition). Work and work Astronomer. Reproductive health. Screening  You may have the following tests or measurements: Height, weight, and BMI. Blood pressure. Lipid and cholesterol levels. These may be checked every 5 years, or more frequently if you are over 43 years old. Skin check. Lung cancer screening. You may have this screening every year starting at age 22 if you have a 30-pack-year history  of smoking and currently smoke or have quit within the past 15 years. Fecal occult blood test (FOBT) of the stool. You may have this test every year starting at age 34. Flexible sigmoidoscopy or colonoscopy. You may have a sigmoidoscopy every 5 years or a colonoscopy every 10 years starting at age 56. Hepatitis C blood test. Hepatitis B blood test. Sexually transmitted disease (STD) testing. Diabetes screening. This is done by checking your  blood sugar (glucose) after you have not eaten for a while (fasting). You may have this done every 1-3 years. Bone density scan. This is done to screen for osteoporosis. You may have this done starting at age 27. Mammogram. This may be done every 1-2 years. Talk to your health care provider about how often you should have regular mammograms. Talk with your health care provider about your test results, treatment options, and if necessary, the need for more tests. Vaccines  Your health care provider may recommend certain vaccines, such as: Influenza vaccine. This is recommended every year. Tetanus, diphtheria, and acellular pertussis (Tdap, Td) vaccine. You may need a Td booster every 10 years. Zoster vaccine. You may need this after age 71. Pneumococcal 13-valent conjugate (PCV13) vaccine. One dose is recommended after age 89. Pneumococcal polysaccharide (PPSV23) vaccine. One dose is recommended after age 33. Talk to your health care provider about which screenings and vaccines you need and how often you need them. This information is not intended to replace advice given to you by your health care provider. Make sure you discuss any questions you have with your health care provider. Document Released: 04/01/2015 Document Revised: 11/23/2015 Document Reviewed: 01/04/2015 Elsevier Interactive Patient Education  2017 [place] Prevention in the Home Falls can cause injuries. They can happen to people of all ages. There are many things you can do to make your home safe and to help prevent falls. What can I do on the outside of my home? Regularly fix the edges of walkways and driveways and fix any cracks. Remove anything that might make you trip as you walk through a door, such as a raised step or threshold. Trim any bushes or trees on the path to your home. Use bright outdoor lighting. Clear any walking paths of anything that might make someone trip, such as rocks or tools. Regularly  check to see if handrails are loose or broken. Make sure that both sides of any steps have handrails. Any raised decks and porches should have guardrails on the edges. Have any leaves, snow, or ice cleared regularly. Use sand or salt on walking paths during winter. Clean up any spills in your garage right away. This includes oil or grease spills. What can I do in the bathroom? Use night lights. Install grab bars by the toilet and in the tub and shower. Do not use towel bars as grab bars. Use non-skid mats or decals in the tub or shower. If you need to sit down in the shower, use a plastic, non-slip stool. Keep the floor dry. Clean up any water that spills on the floor as soon as it happens. Remove soap buildup in the tub or shower regularly. Attach bath mats securely with double-sided non-slip rug tape. Do not have throw rugs and other things on the floor that can make you trip. What can I do in the bedroom? Use night lights. Make sure that you have a light by your bed that is easy to reach. Do not use any sheets or blankets  that are too big for your bed. They should not hang down onto the floor. Have a firm chair that has side arms. You can use this for support while you get dressed. Do not have throw rugs and other things on the floor that can make you trip. What can I do in the kitchen? Clean up any spills right away. Avoid walking on wet floors. Keep items that you use a lot in easy-to-reach places. If you need to reach something above you, use a strong step stool that has a grab bar. Keep electrical cords out of the way. Do not use floor polish or wax that makes floors slippery. If you must use wax, use non-skid floor wax. Do not have throw rugs and other things on the floor that can make you trip. What can I do with my stairs? Do not leave any items on the stairs. Make sure that there are handrails on both sides of the stairs and use them. Fix handrails that are broken or loose.  Make sure that handrails are as long as the stairways. Check any carpeting to make sure that it is firmly attached to the stairs. Fix any carpet that is loose or worn. Avoid having throw rugs at the top or bottom of the stairs. If you do have throw rugs, attach them to the floor with carpet tape. Make sure that you have a light switch at the top of the stairs and the bottom of the stairs. If you do not have them, ask someone to add them for you. What else can I do to help prevent falls? Wear shoes that: Do not have high heels. Have rubber bottoms. Are comfortable and fit you well. Are closed at the toe. Do not wear sandals. If you use a stepladder: Make sure that it is fully opened. Do not climb a closed stepladder. Make sure that both sides of the stepladder are locked into place. Ask someone to hold it for you, if possible. Clearly mark and make sure that you can see: Any grab bars or handrails. First and last steps. Where the edge of each step is. Use tools that help you move around (mobility aids) if they are needed. These include: Canes. Walkers. Scooters. Crutches. Turn on the lights when you go into a dark area. Replace any light bulbs as soon as they burn out. Set up your furniture so you have a clear path. Avoid moving your furniture around. If any of your floors are uneven, fix them. If there are any pets around you, be aware of where they are. Review your medicines with your doctor. Some medicines can make you feel dizzy. This can increase your chance of falling. Ask your doctor what other things that you can do to help prevent falls. This information is not intended to replace advice given to you by your health care provider. Make sure you discuss any questions you have with your health care provider. Document Released: 12/30/2008 Document Revised: 08/11/2015 Document Reviewed: 04/09/2014 Elsevier Interactive Patient Education  2017 [employer].

## 2022-10-12 ENCOUNTER — Encounter (INDEPENDENT_AMBULATORY_CARE_PROVIDER_SITE_OTHER): Payer: Self-pay | Admitting: *Deleted

## 2022-10-17 ENCOUNTER — Ambulatory Visit: Payer: Medicare Other | Admitting: Family Medicine

## 2022-10-17 DIAGNOSIS — H401121 Primary open-angle glaucoma, left eye, mild stage: Secondary | ICD-10-CM | POA: Diagnosis not present

## 2022-10-17 DIAGNOSIS — H2512 Age-related nuclear cataract, left eye: Secondary | ICD-10-CM | POA: Diagnosis not present

## 2022-10-17 DIAGNOSIS — H348312 Tributary (branch) retinal vein occlusion, right eye, stable: Secondary | ICD-10-CM | POA: Diagnosis not present

## 2022-10-17 DIAGNOSIS — H401112 Primary open-angle glaucoma, right eye, moderate stage: Secondary | ICD-10-CM | POA: Diagnosis not present

## 2022-10-25 ENCOUNTER — Encounter: Payer: Self-pay | Admitting: Family Medicine

## 2022-10-25 ENCOUNTER — Ambulatory Visit (INDEPENDENT_AMBULATORY_CARE_PROVIDER_SITE_OTHER): Payer: Medicare Other | Admitting: Family Medicine

## 2022-10-25 VITALS — BP 170/96 | HR 68 | Ht 62.0 in | Wt 143.0 lb

## 2022-10-25 DIAGNOSIS — I10 Essential (primary) hypertension: Secondary | ICD-10-CM

## 2022-10-25 DIAGNOSIS — E7849 Other hyperlipidemia: Secondary | ICD-10-CM

## 2022-10-25 DIAGNOSIS — R058 Other specified cough: Secondary | ICD-10-CM | POA: Diagnosis not present

## 2022-10-25 DIAGNOSIS — D126 Benign neoplasm of colon, unspecified: Secondary | ICD-10-CM | POA: Diagnosis not present

## 2022-10-25 DIAGNOSIS — Z1231 Encounter for screening mammogram for malignant neoplasm of breast: Secondary | ICD-10-CM

## 2022-10-25 DIAGNOSIS — E559 Vitamin D deficiency, unspecified: Secondary | ICD-10-CM

## 2022-10-25 MED ORDER — AZELASTINE HCL 0.1 % NA SOLN: 2.0000 | Freq: Two times a day (BID) | NASAL | 1 refills | Status: DC

## 2022-10-25 MED ORDER — BENZONATATE 100 MG PO CAPS: 100.0000 mg | ORAL_CAPSULE | Freq: Two times a day (BID) | ORAL | 0 refills | Status: DC | PRN

## 2022-10-25 NOTE — Assessment & Plan Note (Signed)
Uncontrolled, likely secondary to on compliance, pt reports that she does forget totake her medication at times but states that she has taken it for  past 2 days Dose increase advised to amlodipine 10 mg daily and she is to send message regarding med she has at home, pharmacy fills show 50 % compliance

## 2022-10-25 NOTE — Assessment & Plan Note (Addendum)
Hyperlipidemia:Low fat diet discussed and encouraged.   Lipid Panel  Lab Results  Component Value Date   CHOL 220 (H) 11/22/2021   HDL 76 11/22/2021   LDLCALC 132 (H) 11/22/2021   TRIG 66 11/22/2021   CHOLHDL 2.9 11/22/2021     Needs to lower fat intake Updated lab needed at/ before next visit.

## 2022-10-25 NOTE — Assessment & Plan Note (Signed)
Tessalon perles and astellin are prescribed

## 2022-10-25 NOTE — Progress Notes (Signed)
   NOUF SIPE     MRN: [id number]      DOB: [birth year]  Chief Complaint  Patient presents with   Follow-up    Follow up cough drainage in throat    HPI Christina Miles is here for follow up and re-evaluation of chronic medical conditions, medication management and review of any available recent lab and radiology data.  Preventive health is updated, specifically  Cancer screening and Immunization.   Questions or concerns regarding consultations or procedures which the PT has had in the interim are  addressed. The PT denies any adverse reactions to current medications since the last visit.  2 week h/o post nasal drainage with cough primarily when lying down, no fever , chi;lls, ear pain or sore throat   ROS Denies chest pains, palpitations and leg swelling Denies abdominal pain, nausea, vomiting,diarrhea or constipation.   Denies dysuria, frequency, hesitancy or incontinence. Denies joint pain, swelling and limitation in mobility. Denies headaches, seizures, numbness, or tingling. Denies depression, anxiety or insomnia. Denies skin break down or rash.   PE  BP (!) 170/96   Pulse 68   Ht 5\' 2"  (1.575 m)   Wt 143 lb (64.9 kg)   SpO2 95%   BMI 26.16 kg/m   Patient alert and oriented and in no cardiopulmonary distress.  HEENT: No facial asymmetry, EOMI,     Neck supple .No sinus tenderness  Chest: Clear to auscultation bilaterally.  CVS: S1, S2 no murmurs, no S3.Regular rate.  ABD: Soft non tender.   Ext: No edema  MS: Adequate ROM spine, shoulders, hips and knees.  Skin: Intact, no ulcerations or rash noted.  Psych: Good eye contact, normal affect. Memory intact not anxious or depressed appearing.  CNS: CN 2-12 intact, power,  normal throughout.no focal deficits noted.   Assessment & Plan  Essential hypertension Uncontrolled, likely secondary to on compliance, pt reports that she does forget totake her medication at times but states that she has taken it for   past 2 days Dose increase advised to amlodipine 10 mg daily and she is to send message regarding med she has at home, pharmacy fills show 50 % compliance  Hyperlipidemia Hyperlipidemia:Low fat diet discussed and encouraged.   Lipid Panel  Lab Results  Component Value Date   CHOL 220 (H) 11/22/2021   HDL 76 11/22/2021   LDLCALC 132 (H) 11/22/2021   TRIG 66 11/22/2021   CHOLHDL 2.9 11/22/2021     Needs to lower fat intake Updated lab needed at/ before next visit.   Allergic cough Tessalon perles and astellin are prescribed

## 2022-10-25 NOTE — Patient Instructions (Addendum)
Annual exam in jan when due , call if you need me sooner  F/U in 3 to 4 weeks re evaluate blood pressure  Send my chart message  today re home bP meds and reading please  Increase amlodipine to 5 mg TWO tablets once daily starting today, continue hydrochlorothiazide one daily with potassium  Fasting lipid, cmp and EGFR, CBC, TSH and vit D next week  Please schedule November mammogram at checkout  You are referred for colonoscopy which is now due  Tessalon perles and astellin spray are prescribed for allergic cough  Thanks for choosing [facility], we consider it a privelige to serve you.

## 2022-10-29 ENCOUNTER — Encounter (INDEPENDENT_AMBULATORY_CARE_PROVIDER_SITE_OTHER): Payer: Self-pay | Admitting: *Deleted

## 2022-11-01 DIAGNOSIS — E7849 Other hyperlipidemia: Secondary | ICD-10-CM | POA: Diagnosis not present

## 2022-11-01 DIAGNOSIS — I10 Essential (primary) hypertension: Secondary | ICD-10-CM | POA: Diagnosis not present

## 2022-11-01 DIAGNOSIS — E559 Vitamin D deficiency, unspecified: Secondary | ICD-10-CM | POA: Diagnosis not present

## 2022-11-02 ENCOUNTER — Other Ambulatory Visit: Payer: Self-pay | Admitting: Family Medicine

## 2022-11-02 LAB — LIPID PANEL: Cholesterol, Total: 217 mg/dL — ABNORMAL HIGH (ref 100–199)

## 2022-11-02 LAB — CMP14+EGFR: Albumin: 4.6 g/dL (ref 3.8–4.8)

## 2022-11-28 DIAGNOSIS — Z961 Presence of intraocular lens: Secondary | ICD-10-CM | POA: Diagnosis not present

## 2022-11-28 DIAGNOSIS — H04123 Dry eye syndrome of bilateral lacrimal glands: Secondary | ICD-10-CM | POA: Diagnosis not present

## 2022-11-28 DIAGNOSIS — H2512 Age-related nuclear cataract, left eye: Secondary | ICD-10-CM | POA: Diagnosis not present

## 2022-11-28 DIAGNOSIS — H401121 Primary open-angle glaucoma, left eye, mild stage: Secondary | ICD-10-CM | POA: Diagnosis not present

## 2022-12-04 DIAGNOSIS — K080 Exfoliation of teeth due to systemic causes: Secondary | ICD-10-CM | POA: Diagnosis not present

## 2022-12-07 ENCOUNTER — Encounter: Payer: Self-pay | Admitting: Family Medicine

## 2022-12-07 ENCOUNTER — Ambulatory Visit (INDEPENDENT_AMBULATORY_CARE_PROVIDER_SITE_OTHER): Payer: Medicare Other | Admitting: Family Medicine

## 2022-12-07 VITALS — BP 140/80 | HR 70 | Ht 62.0 in | Wt 140.0 lb

## 2022-12-07 DIAGNOSIS — Z23 Encounter for immunization: Secondary | ICD-10-CM | POA: Diagnosis not present

## 2022-12-07 DIAGNOSIS — E559 Vitamin D deficiency, unspecified: Secondary | ICD-10-CM

## 2022-12-07 DIAGNOSIS — I10 Essential (primary) hypertension: Secondary | ICD-10-CM | POA: Diagnosis not present

## 2022-12-07 DIAGNOSIS — E7849 Other hyperlipidemia: Secondary | ICD-10-CM

## 2022-12-07 MED ORDER — AMLODIPINE BESYLATE 2.5 MG PO TABS: 2.5000 mg | ORAL_TABLET | Freq: Every day | ORAL | 1 refills | Status: DC

## 2022-12-07 MED ORDER — VITAMIN D (ERGOCALCIFEROL) 1.25 MG (50000 UNIT) PO CAPS: 50000.0000 [IU] | ORAL_CAPSULE | ORAL | 8 refills | Status: DC

## 2022-12-07 NOTE — Patient Instructions (Signed)
F/u in  4 weeks, re evaluate blood pressure  New hIGHR dose of amlodipine is 5mg  AND 2.5 mg which is 7.5 mg total every day.  Continue hydrochlorothiazide as before  New is once weekly vit d  Nurse pls print and give her the letter from GI sent in July that asks her to fill in paperwork  Flu vaccine today  Thanks for choosing [facility], we consider it a privelige to serve you.

## 2022-12-07 NOTE — Progress Notes (Unsigned)
Christina Miles     MRN: [id number]      DOB: 11-09-1945  Chief Complaint  Patient presents with   Follow-up    Follow up no concerns    HPI Christina Miles is here for follow up and re-evaluation of chronic medical conditions, medication management and review of any available recent lab and radiology data.  Preventive health is updated, specifically  Cancer screening and Immunization.   Questions or concerns regarding consultations or procedures which the PT has had in the interim are  addressed. The PT denies any adverse reactions to current medications since the last visit.  There are no new concerns.  There are no specific complaints   ROS Denies recent fever or chills. Denies sinus pressure, nasal congestion, ear pain or sore throat. Denies chest congestion, productive cough or wheezing. Denies chest pains, palpitations and leg swelling Denies abdominal pain, nausea, vomiting,diarrhea or constipation.   Denies dysuria, frequency, hesitancy or incontinence. Denies joint pain, swelling and limitation in mobility. Denies headaches, seizures, numbness, or tingling. Denies depression, anxiety or insomnia. Denies skin break down or rash.   PE  BP (!) 140/80   Pulse 70   Ht 5\' 2"  (1.575 m)   Wt 140 lb 0.6 oz (63.5 kg)   SpO2 96%   BMI 25.61 kg/m   Patient alert and oriented and in no cardiopulmonary distress.  HEENT: No facial asymmetry, EOMI,     Neck supple .  Chest: Clear to auscultation bilaterally.  CVS: S1, S2 no murmurs, no S3.Regular rate.  ABD: Soft non tender.   Ext: No edema  MS: Adequate ROM spine, shoulders, hips and knees.  Skin: Intact, no ulcerations or rash noted.  Psych: Good eye contact, normal affect. Memory intact not anxious or depressed appearing.  CNS: CN 2-12 intact, power,  normal throughout.no focal deficits noted.   Assessment & Plan  No problem-specific Assessment & Plan notes found for this encounter.

## 2022-12-09 ENCOUNTER — Encounter: Payer: Self-pay | Admitting: Family Medicine

## 2022-12-09 NOTE — Assessment & Plan Note (Signed)
Hyperlipidemia:Low fat diet discussed and encouraged.   Lipid Panel  Lab Results  Component Value Date   CHOL 217 (H) 11/01/2022   HDL 78 11/01/2022   LDLCALC 128 (H) 11/01/2022   TRIG 64 11/01/2022   CHOLHDL 2.8 11/01/2022     Needs to reduce fat intake

## 2022-12-09 NOTE — Assessment & Plan Note (Signed)
Uncontrolled, inc amlodipine to 7.5 mg daily, continue hydrochlorothiazide as before DASH diet and commitment to daily physical activity for a minimum of 30 minutes discussed and encouraged, as a part of hypertension management. The importance of attaining a healthy weight is also discussed.     12/07/2022   10:43 AM 12/07/2022   10:29 AM 12/07/2022   10:27 AM 10/25/2022   11:18 AM 10/25/2022   10:41 AM 10/25/2022   10:39 AM 09/05/2022    8:36 AM  BP/Weight  Systolic BP 140 138 154 170 156 161 128  Diastolic BP 80 81 72 96 79 76 78  Wt. (Lbs)   140.04   143 139  BMI   25.61 kg/m2   26.16 kg/m2 25.02 kg/m2

## 2022-12-09 NOTE — Assessment & Plan Note (Signed)
Needs to take supplement

## 2022-12-18 ENCOUNTER — Other Ambulatory Visit (HOSPITAL_COMMUNITY)
Admission: RE | Admit: 2022-12-18 | Discharge: 2022-12-18 | Disposition: A | Discharge status: Home / Self Care | Payer: Medicare Other | Source: Ambulatory Visit | Attending: Gastroenterology | Admitting: Gastroenterology

## 2022-12-18 ENCOUNTER — Encounter (INDEPENDENT_AMBULATORY_CARE_PROVIDER_SITE_OTHER): Payer: Self-pay | Admitting: Gastroenterology

## 2022-12-18 ENCOUNTER — Ambulatory Visit (INDEPENDENT_AMBULATORY_CARE_PROVIDER_SITE_OTHER): Payer: Medicare Other | Admitting: Gastroenterology

## 2022-12-18 VITALS — BP 127/84 | HR 69 | Temp 97.6°F | Ht 62.5 in | Wt 141.7 lb

## 2022-12-18 DIAGNOSIS — K5900 Constipation, unspecified: Secondary | ICD-10-CM | POA: Insufficient documentation

## 2022-12-18 DIAGNOSIS — R1012 Left upper quadrant pain: Secondary | ICD-10-CM | POA: Insufficient documentation

## 2022-12-18 DIAGNOSIS — R142 Eructation: Secondary | ICD-10-CM | POA: Insufficient documentation

## 2022-12-18 DIAGNOSIS — Z8619 Personal history of other infectious and parasitic diseases: Secondary | ICD-10-CM | POA: Diagnosis not present

## 2022-12-18 LAB — BASIC METABOLIC PANEL
Anion gap: 9 (ref 5–15)
BUN: 18 mg/dL (ref 8–23)
CO2: 28 mmol/L (ref 22–32)
Calcium: 9.7 mg/dL (ref 8.9–10.3)
Chloride: 97 mmol/L — ABNORMAL LOW (ref 98–111)
Creatinine, Ser: 1.01 mg/dL — ABNORMAL HIGH (ref 0.44–1.00)
GFR, Estimated: 58 mL/min — ABNORMAL LOW (ref >60)
Glucose, Bld: 95 mg/dL (ref 70–99)
Potassium: 4.2 mmol/L (ref 3.5–5.1)
Sodium: 134 mmol/L — ABNORMAL LOW (ref 135–145)

## 2022-12-18 MED ORDER — PEG 3350-KCL-NA BICARB-NACL 420 G PO SOLR: 4000.0000 mL | Freq: Once | ORAL | 0 refills | Status: AC

## 2022-12-18 NOTE — H&P (View-Only) (Signed)
Referring Provider: Kerri Perches, MD Primary Care Physician:  Kerri Perches, MD Primary GI Physician: Dr. Levon Hedger   Chief Complaint  Patient presents with   Follow-up    Patient here today for a follow up. She says she is having issues with indigestion and gas.    HPI:   Christina Miles is a 77 y.o. female with past medical history of history of hepatitis C, HTN, and depression.   Patient presenting today for follow up   Last seen April 2024, at that time here for constipation, hard stool balls but bowel habits had improved with change in diet prior to seeing me.  Recommended to continue with dietary changes, repeat Colonoscopy in August. Repeat AFP, if <15 no further HCC screening.  AFP was 15.1  Present  Patient states that she began having some pain up under her left rib cage, into her left upper back. She also notes she was belching a lot. She does note she ate a lot of chocolate the night before she had the LUQ pain. She denies acid regurgitation or heartburn. No nausea or vomiting. Denies rectal bleeding or melena. She took some tylenol and pain seemed to be improved some thereafter. She had some issues with constipation around the same time and notes once she was able to have a few good BMs, her pain and belching subsided. notes she ate more fruit which helps her to move her bowels easier when she is having some difficulty.   She states she dropped off her form to have repeat screening colonoscopy last week.   Last Colonoscopy:August 2019: Small polyp at splenic flexure and distal sigmoid colon, external hemorrhoids. Path showed tubular adenoma.  Last Endoscopy:  Recommendations:    Past Medical History:  Diagnosis Date   Arthritis    Not sure of date first noted   Cataract    Depression 2014   Encounter for Medicare annual examination with abnormal findings 04/17/2022   Glaucoma 2018   Hepatitis C    Hypertension    Reduced vision 2015   right,  had hemmorhage, treated by [facility], injections    Past Surgical History:  Procedure Laterality Date   ABDOMINAL HYSTERECTOMY  1985   . ? mass on ovary   APPENDECTOMY     BACK SURGERY     CHOLECYSTECTOMY     COLONOSCOPY  02/20/2012   Procedure: COLONOSCOPY;  Surgeon: Malissa Hippo, MD;  Location: [facility];  Service: Endoscopy;  Laterality: N/A;  930   COLONOSCOPY     Patient states that this was 3-4 years ago @[facility]    COLONOSCOPY N/A 10/24/2017   [facility]hman: small polyp at splenic flexure and distal sigmoid colon, external hemorrhoids. (Tubular adenoma)   EYE SURGERY  2019   GLAUCOMA SURGERY     05/2017   POLYPECTOMY  10/24/2017   Procedure: POLYPECTOMY;  Surgeon: Malissa Hippo, MD;  Location: [facility];  Service: Endoscopy;;  colon   SPINE SURGERY  1998 approx   [place]    Current Outpatient Medications  Medication Sig Dispense Refill   amLODipine (NORVASC) 2.5 MG tablet Take 1 tablet (2.5 mg total) by mouth daily. 30 tablet 1   amLODipine (NORVASC) 5 MG tablet Take 1 tablet (5 mg total) by mouth daily.     azelastine (ASTELIN) 0.1 % nasal spray Place 2 sprays into both nostrils 2 (two) times daily. Use in each nostril as directed 30 mL 1   brimonidine-timolol (COMBIGAN) 0.2-0.5 %  ophthalmic solution Place 1 drop into both eyes every 12 (twelve) hours.     Calcium Carbonate-Vitamin D 600-400 MG-UNIT tablet Take 2 tablets by mouth daily.     fluticasone (FLONASE) 50 MCG/ACT nasal spray Place 2 sprays into both nostrils daily. 16 g 6   hydrochlorothiazide (HYDRODIURIL) 25 MG tablet Take 1 tablet by mouth once daily for blood pressure 90 tablet 3   latanoprost (XALATAN) 0.005 % ophthalmic solution Place 1 drop into both eyes at bedtime.     mirtazapine (REMERON) 7.5 MG tablet TAKE (1) TABLET BY MOUTH AT BEDTIME. 30 tablet 0   potassium chloride (KLOR-CON) 10 MEQ tablet TAKE (1) TABLET BY MOUTH TWICE DAILY. 180 tablet 3   prednisoLONE acetate (PRED FORTE) 1 % ophthalmic  suspension Place 1 drop into the right eye daily at 6 (six) AM.     Vitamin D, Ergocalciferol, (DRISDOL) 1.25 MG (50000 UNIT) CAPS capsule Take 1 capsule (50,000 Units total) by mouth every 7 (seven) days. 5 capsule 8   No current facility-administered medications for this visit.    Allergies as of 12/18/2022 - Review Complete 12/18/2022  Allergen Reaction Noted   Ace inhibitors Cough 10/23/2011    Family History  Problem Relation Age of Onset   Colon cancer Father    Cancer Father    Dementia Mother    Diabetes Brother    Hypertension Brother    Hypertension Brother     Social History   Socioeconomic History   Marital status: Married    Spouse name: Not on file   Number of children: Not on file   Years of education: Not on file   Highest education level: Associate degree: occupational, Scientist, product/process development, or vocational program  Occupational History   Not on file  Tobacco Use   Smoking status: Former    Current packs/day: 0.00    Average packs/day: 1 pack/day for 10.0 years (10.0 ttl pk-yrs)    Types: Cigarettes    Start date: 11/30/1980    Quit date: 12/01/1990    Years since quitting: 32.0    Passive exposure: Current   Smokeless tobacco: Never  Vaping Use   Vaping status: Never Used  Substance and Sexual Activity   Alcohol use: No   Drug use: No   Sexual activity: Yes  Other Topics Concern   Not on file  Social History Narrative   Not on file   Social Determinants of Health   Financial Resource Strain: Low Risk  (10/24/2022)   Overall Financial Resource Strain (CARDIA)    Difficulty of Paying Living Expenses: Not hard at all  Food Insecurity: No Food Insecurity (10/24/2022)   Hunger Vital Sign    Worried About Running Out of Food in the Last Year: Never true    Ran Out of Food in the Last Year: Never true  Transportation Needs: No Transportation Needs (10/24/2022)   PRAPARE - Administrator, Civil Service (Medical): No    Lack of Transportation  (Non-Medical): No  Physical Activity: Insufficiently Active (10/24/2022)   Exercise Vital Sign    Days of Exercise per Week: 3 days    Minutes of Exercise per Session: 20 min  Stress: No Stress Concern Present (10/24/2022)   [employer] of Occupational Health - Occupational Stress Questionnaire    Feeling of Stress : Not at all  Social Connections: Socially Integrated (10/24/2022)   Social Connection and Isolation Panel [NHANES]    Frequency of Communication with Friends and Family: Twice a  week    Frequency of Social Gatherings with Friends and Family: More than three times a week    Attends Religious Services: More than 4 times per year    Active Member of [employer] or Organizations: Yes    Attends Banker Meetings: 1 to 4 times per year    Marital Status: Married    Review of systems General: negative for malaise, night sweats, fever, chills, weight loss Neck: Negative for lumps, goiter, pain and significant neck swelling Resp: Negative for cough, wheezing, dyspnea at rest CV: Negative for chest pain, leg swelling, palpitations, orthopnea GI: denies melena, hematochezia, nausea, vomiting, diarrhea, dysphagia, odyonophagia, early satiety or unintentional weight loss. +LUQ pain +belching +constipation  MSK: Negative for joint pain or swelling, back pain, and muscle pain. Derm: Negative for itching or rash Psych: Denies depression, anxiety, memory loss, confusion. No homicidal or suicidal ideation.  Heme: Negative for prolonged bleeding, bruising easily, and swollen nodes. Endocrine: Negative for cold or heat intolerance, polyuria, polydipsia and goiter. Neuro: negative for tremor, gait imbalance, syncope and seizures. The remainder of the review of systems is noncontributory.  Physical Exam: BP 127/84 (BP Location: Left Arm, Patient Position: Sitting, Cuff Size: Normal)   Pulse 69   Temp 97.6 F (36.4 C) (Temporal)   Ht 5' 2.5" (1.588 m)   Wt 141 lb 11.2 oz (64.3 kg)    BMI 25.50 kg/m  General:   Alert and oriented. No distress noted. Pleasant and cooperative.  Head:  Normocephalic and atraumatic. Eyes:  Conjuctiva clear without scleral icterus. Mouth:  Oral mucosa pink and moist. Good dentition. No lesions. Heart: Normal rate and rhythm, s1 and s2 heart sounds present.  Lungs: Clear lung sounds in all lobes. Respirations equal and unlabored. Abdomen:  +BS, soft, non-tender and non-distended. No rebound or guarding. No HSM or masses noted. Derm: No palmar erythema or jaundice Msk:  Symmetrical without gross deformities. Normal posture. Extremities:  Without edema. Neurologic:  Alert and  oriented x4 Psych:  Alert and cooperative. Normal mood and affect.  Invalid input(s): "6 MONTHS"   ASSESSMENT: Christina Miles is a 77 y.o. female presenting today for LUQ pain and belching  LUQ pain/Belching: self limiting LUQ pain and belching that seemed to improve with moving her bowels. She denies any further issues. No GERD symptoms. No alarm symptoms. Would recommend reflux precautions, good water intake, diet high in fruits, veggies and being mindful that high fiber foods can cause more gas. She can try IB gard if she is having occasional belching, though if this becomes a frequent issue or if LUQ pain reoccurs, she should let me know.   She is overdue for screening colonoscopy as of August 2024, she submitted her screening colonoscopy form last week. I will make sure we have this so we can get her scheduled  History of Hep C with SVR achieved in 2019, s/p Epclusa. Last elastography 11/2021 with kPa 4.7, and no obvious changes of the liver. She had been having HCC screening via AFP every 6 months, though no clear reason why as she did not meet criteria for hepatoma screening. Last AFP in [facility] was 15.1, peak of 17 in 2020. At this time, will hold off on further [facility] screening as she does not meet criteria for continuation given no findings of fibrosis on her last  elastography.   PLAN:  Be mindful of high fiber foods, reflux precautions Increase water intake, aim for atleast 64 oz per day Increase fruits,  veggies and whole grains, kiwi and prunes are especially good for constipation 4. Will schedule screening colonoscopy  5.  Can use IB gard for occasional belching 6. Hold off on further [facility] screening   All questions were answered, patient verbalized understanding and is in agreement with plan as outlined above.   Follow Up: 6 months   Krizia Flight L. Jeanmarie Hubert, MSN, APRN, AGNP-C Adult-Gerontology Nurse Practitioner [facility]  I have reviewed the note and agree with the APP's assessment as described in this progress note  Katrinka Blazing, MD Gastroenterology and Hepatology [facility] Gastroenterology

## 2022-12-18 NOTE — Patient Instructions (Addendum)
I am glad you are feeling better Be mindful of greasy, spicy, fried, citrus foods, caffeine, carbonated drinks, and chocolate as these can increase reflux symptoms Be mindful that high fiber foods can also cause more gas You can use over the counter IB gard if you have occasional gas/belching, if you have recurrence of abdominal pain or more frequent belching, please let me know We will make sure to get you scheduled for your screening colonoscopy that is due  Follow up 6 months  It was a pleasure to see you today. I want to create trusting relationships with patients and provide genuine, compassionate, and quality care. I truly value your feedback! please be on the lookout for a survey regarding your visit with me today. I appreciate your input about our visit and your time in completing this!    Bridie Colquhoun L. Jeanmarie Hubert, MSN, APRN, AGNP-C Adult-Gerontology Nurse Practitioner [facility] Gastroenterology at [facility]

## 2022-12-18 NOTE — Progress Notes (Signed)
Referring Provider: Kerri Perches, MD Primary Care Physician:  Kerri Perches, MD Primary GI Physician: Dr. Levon Hedger   Chief Complaint  Patient presents with   Follow-up    Patient here today for a follow up. She says she is having issues with indigestion and gas.    HPI:   Christina Miles is a 77 y.o. female with past medical history of history of hepatitis C, HTN, and depression.   Patient presenting today for follow up   Last seen April 2024, at that time here for constipation, hard stool balls but bowel habits had improved with change in diet prior to seeing me.  Recommended to continue with dietary changes, repeat Colonoscopy in August. Repeat AFP, if <15 no further HCC screening.  AFP was 15.1  Present  Patient states that she began having some pain up under her left rib cage, into her left upper back. She also notes she was belching a lot. She does note she ate a lot of chocolate the night before she had the LUQ pain. She denies acid regurgitation or heartburn. No nausea or vomiting. Denies rectal bleeding or melena. She took some tylenol and pain seemed to be improved some thereafter. She had some issues with constipation around the same time and notes once she was able to have a few good BMs, her pain and belching subsided. notes she ate more fruit which helps her to move her bowels easier when she is having some difficulty.   She states she dropped off her form to have repeat screening colonoscopy last week.   Last Colonoscopy:August 2019: Small polyp at splenic flexure and distal sigmoid colon, external hemorrhoids. Path showed tubular adenoma.  Last Endoscopy:  Recommendations:    Past Medical History:  Diagnosis Date   Arthritis    Not sure of date first noted   Cataract    Depression 2014   Encounter for Medicare annual examination with abnormal findings 04/17/2022   Glaucoma 2018   Hepatitis C    Hypertension    Reduced vision 2015   right,  had hemmorhage, treated by [facility], injections    Past Surgical History:  Procedure Laterality Date   ABDOMINAL HYSTERECTOMY  1985   . ? mass on ovary   APPENDECTOMY     BACK SURGERY     CHOLECYSTECTOMY     COLONOSCOPY  02/20/2012   Procedure: COLONOSCOPY;  Surgeon: Malissa Hippo, MD;  [facility];  Service: Endoscopy;  Laterality: N/A;  930   COLONOSCOPY     Patient states that this was 3-4 years ago @[facility]    COLONOSCOPY N/A 10/24/2017   [facility]hman: small polyp at splenic flexure and distal sigmoid colon, external hemorrhoids. (Tubular adenoma)   EYE SURGERY  2019   GLAUCOMA SURGERY     05/2017   POLYPECTOMY  10/24/2017   Procedure: POLYPECTOMY;  Surgeon: Malissa Hippo, MD;  Location: [facility];  Service: Endoscopy;;  colon   SPINE SURGERY  1998 approx   [place]    Current Outpatient Medications  Medication Sig Dispense Refill   amLODipine (NORVASC) 2.5 MG tablet Take 1 tablet (2.5 mg total) by mouth daily. 30 tablet 1   amLODipine (NORVASC) 5 MG tablet Take 1 tablet (5 mg total) by mouth daily.     azelastine (ASTELIN) 0.1 % nasal spray Place 2 sprays into both nostrils 2 (two) times daily. Use in each nostril as directed 30 mL 1   brimonidine-timolol (COMBIGAN) 0.2-0.5 %  ophthalmic solution Place 1 drop into both eyes every 12 (twelve) hours.     Calcium Carbonate-Vitamin D 600-400 MG-UNIT tablet Take 2 tablets by mouth daily.     fluticasone (FLONASE) 50 MCG/ACT nasal spray Place 2 sprays into both nostrils daily. 16 g 6   hydrochlorothiazide (HYDRODIURIL) 25 MG tablet Take 1 tablet by mouth once daily for blood pressure 90 tablet 3   latanoprost (XALATAN) 0.005 % ophthalmic solution Place 1 drop into both eyes at bedtime.     mirtazapine (REMERON) 7.5 MG tablet TAKE (1) TABLET BY MOUTH AT BEDTIME. 30 tablet 0   potassium chloride (KLOR-CON) 10 MEQ tablet TAKE (1) TABLET BY MOUTH TWICE DAILY. 180 tablet 3   prednisoLONE acetate (PRED FORTE) 1 % ophthalmic  suspension Place 1 drop into the right eye daily at 6 (six) AM.     Vitamin D, Ergocalciferol, (DRISDOL) 1.25 MG (50000 UNIT) CAPS capsule Take 1 capsule (50,000 Units total) by mouth every 7 (seven) days. 5 capsule 8   No current facility-administered medications for this visit.    Allergies as of 12/18/2022 - Review Complete 12/18/2022  Allergen Reaction Noted   Ace inhibitors Cough 10/23/2011    Family History  Problem Relation Age of Onset   Colon cancer Father    Cancer Father    Dementia Mother    Diabetes Brother    Hypertension Brother    Hypertension Brother     Social History   Socioeconomic History   Marital status: Married    Spouse name: Not on file   Number of children: Not on file   Years of education: Not on file   Highest education level: Associate degree: occupational, Scientist, product/process development, or vocational program  Occupational History   Not on file  Tobacco Use   Smoking status: Former    Current packs/day: 0.00    Average packs/day: 1 pack/day for 10.0 years (10.0 ttl pk-yrs)    Types: Cigarettes    Start date: 11/30/1980    Quit date: 12/01/1990    Years since quitting: 32.0    Passive exposure: Current   Smokeless tobacco: Never  Vaping Use   Vaping status: Never Used  Substance and Sexual Activity   Alcohol use: No   Drug use: No   Sexual activity: Yes  Other Topics Concern   Not on file  Social History Narrative   Not on file   Social Determinants of Health   Financial Resource Strain: Low Risk  (10/24/2022)   Overall Financial Resource Strain (CARDIA)    Difficulty of Paying Living Expenses: Not hard at all  Food Insecurity: No Food Insecurity (10/24/2022)   Hunger Vital Sign    Worried About Running Out of Food in the Last Year: Never true    Ran Out of Food in the Last Year: Never true  Transportation Needs: No Transportation Needs (10/24/2022)   PRAPARE - Administrator, Civil Service (Medical): No    Lack of Transportation  (Non-Medical): No  Physical Activity: Insufficiently Active (10/24/2022)   Exercise Vital Sign    Days of Exercise per Week: 3 days    Minutes of Exercise per Session: 20 min  Stress: No Stress Concern Present (10/24/2022)   [employer] of Occupational Health - Occupational Stress Questionnaire    Feeling of Stress : Not at all  Social Connections: Socially Integrated (10/24/2022)   Social Connection and Isolation Panel [NHANES]    Frequency of Communication with Friends and Family: Twice a  week    Frequency of Social Gatherings with Friends and Family: More than three times a week    Attends Religious Services: More than 4 times per year    Active Member of [employer] or Organizations: Yes    Attends Banker Meetings: 1 to 4 times per year    Marital Status: Married    Review of systems General: negative for malaise, night sweats, fever, chills, weight loss Neck: Negative for lumps, goiter, pain and significant neck swelling Resp: Negative for cough, wheezing, dyspnea at rest CV: Negative for chest pain, leg swelling, palpitations, orthopnea GI: denies melena, hematochezia, nausea, vomiting, diarrhea, dysphagia, odyonophagia, early satiety or unintentional weight loss. +LUQ pain +belching +constipation  MSK: Negative for joint pain or swelling, back pain, and muscle pain. Derm: Negative for itching or rash Psych: Denies depression, anxiety, memory loss, confusion. No homicidal or suicidal ideation.  Heme: Negative for prolonged bleeding, bruising easily, and swollen nodes. Endocrine: Negative for cold or heat intolerance, polyuria, polydipsia and goiter. Neuro: negative for tremor, gait imbalance, syncope and seizures. The remainder of the review of systems is noncontributory.  Physical Exam: BP 127/84 (BP Location: Left Arm, Patient Position: Sitting, Cuff Size: Normal)   Pulse 69   Temp 97.6 F (36.4 C) (Temporal)   Ht 5' 2.5" (1.588 m)   Wt 141 lb 11.2 oz (64.3 kg)    BMI 25.50 kg/m  General:   Alert and oriented. No distress noted. Pleasant and cooperative.  Head:  Normocephalic and atraumatic. Eyes:  Conjuctiva clear without scleral icterus. Mouth:  Oral mucosa pink and moist. Good dentition. No lesions. Heart: Normal rate and rhythm, s1 and s2 heart sounds present.  Lungs: Clear lung sounds in all lobes. Respirations equal and unlabored. Abdomen:  +BS, soft, non-tender and non-distended. No rebound or guarding. No HSM or masses noted. Derm: No palmar erythema or jaundice Msk:  Symmetrical without gross deformities. Normal posture. Extremities:  Without edema. Neurologic:  Alert and  oriented x4 Psych:  Alert and cooperative. Normal mood and affect.  Invalid input(s): "6 MONTHS"   ASSESSMENT: Christina Miles is a 77 y.o. female presenting today for LUQ pain and belching  LUQ pain/Belching: self limiting LUQ pain and belching that seemed to improve with moving her bowels. She denies any further issues. No GERD symptoms. No alarm symptoms. Would recommend reflux precautions, good water intake, diet high in fruits, veggies and being mindful that high fiber foods can cause more gas. She can try IB gard if she is having occasional belching, though if this becomes a frequent issue or if LUQ pain reoccurs, she should let me know.   She is overdue for screening colonoscopy as of August 2024, she submitted her screening colonoscopy form last week. I will make sure we have this so we can get her scheduled  History of Hep C with SVR achieved in 2019, s/p Epclusa. Last elastography 11/2021 with kPa 4.7, and no obvious changes of the liver. She had been having HCC screening via AFP every 6 months, though no clear reason why as she did not meet criteria for hepatoma screening. Last AFP in [facility] was 15.1, peak of 17 in 2020. At this time, will hold off on further [facility] screening as she does not meet criteria for continuation given no findings of fibrosis on her last  elastography.   PLAN:  Be mindful of high fiber foods, reflux precautions Increase water intake, aim for atleast 64 oz per day Increase fruits,  veggies and whole grains, kiwi and prunes are especially good for constipation 4. Will schedule screening colonoscopy  5.  Can use IB gard for occasional belching 6. Hold off on further [facility] screening   All questions were answered, patient verbalized understanding and is in agreement with plan as outlined above.   Follow Up: 6 months   Nethaniel Mattie L. Jeanmarie Hubert, MSN, APRN, AGNP-C Adult-Gerontology Nurse Practitioner [facility]  I have reviewed the note and agree with the APP's assessment as described in this progress note  Katrinka Blazing, MD Gastroenterology and Hepatology [facility]

## 2022-12-19 ENCOUNTER — Encounter (INDEPENDENT_AMBULATORY_CARE_PROVIDER_SITE_OTHER): Payer: Self-pay

## 2023-01-04 ENCOUNTER — Encounter (HOSPITAL_COMMUNITY): Payer: Self-pay

## 2023-01-04 ENCOUNTER — Encounter (HOSPITAL_COMMUNITY)
Admission: RE | Admit: 2023-01-04 | Discharge: 2023-01-04 | Disposition: A | Discharge status: Home / Self Care | Payer: Medicare Other | Source: Ambulatory Visit | Attending: Gastroenterology | Admitting: Gastroenterology

## 2023-01-08 ENCOUNTER — Ambulatory Visit (HOSPITAL_BASED_OUTPATIENT_CLINIC_OR_DEPARTMENT_OTHER): Payer: Medicare Other | Admitting: Anesthesiology

## 2023-01-08 ENCOUNTER — Encounter (HOSPITAL_COMMUNITY): Payer: Self-pay | Admitting: Gastroenterology

## 2023-01-08 ENCOUNTER — Ambulatory Visit (HOSPITAL_COMMUNITY)
Admission: RE | Admit: 2023-01-08 | Discharge: 2023-01-08 | Disposition: A | Discharge status: Home / Self Care | Payer: Medicare Other | Attending: Gastroenterology | Admitting: Gastroenterology

## 2023-01-08 ENCOUNTER — Ambulatory Visit (HOSPITAL_COMMUNITY): Payer: Medicare Other | Admitting: Anesthesiology

## 2023-01-08 ENCOUNTER — Encounter (HOSPITAL_COMMUNITY)
Admission: RE | Disposition: A | Discharge status: Home / Self Care | Payer: Self-pay | Source: Home / Self Care | Attending: Gastroenterology

## 2023-01-08 DIAGNOSIS — Z1211 Encounter for screening for malignant neoplasm of colon: Secondary | ICD-10-CM | POA: Insufficient documentation

## 2023-01-08 DIAGNOSIS — Z860100 Personal history of colon polyps, unspecified: Secondary | ICD-10-CM

## 2023-01-08 DIAGNOSIS — K5900 Constipation, unspecified: Secondary | ICD-10-CM | POA: Diagnosis not present

## 2023-01-08 DIAGNOSIS — D126 Benign neoplasm of colon, unspecified: Secondary | ICD-10-CM

## 2023-01-08 DIAGNOSIS — Z87891 Personal history of nicotine dependence: Secondary | ICD-10-CM | POA: Insufficient documentation

## 2023-01-08 DIAGNOSIS — I10 Essential (primary) hypertension: Secondary | ICD-10-CM

## 2023-01-08 DIAGNOSIS — Z8619 Personal history of other infectious and parasitic diseases: Secondary | ICD-10-CM | POA: Diagnosis not present

## 2023-01-08 DIAGNOSIS — D124 Benign neoplasm of descending colon: Secondary | ICD-10-CM | POA: Diagnosis not present

## 2023-01-08 DIAGNOSIS — Z860101 Personal history of adenomatous and serrated colon polyps: Secondary | ICD-10-CM | POA: Insufficient documentation

## 2023-01-08 DIAGNOSIS — Z09 Encounter for follow-up examination after completed treatment for conditions other than malignant neoplasm: Secondary | ICD-10-CM | POA: Insufficient documentation

## 2023-01-08 DIAGNOSIS — K635 Polyp of colon: Secondary | ICD-10-CM | POA: Diagnosis not present

## 2023-01-08 DIAGNOSIS — F32A Depression, unspecified: Secondary | ICD-10-CM | POA: Diagnosis not present

## 2023-01-08 DIAGNOSIS — F419 Anxiety disorder, unspecified: Secondary | ICD-10-CM | POA: Diagnosis not present

## 2023-01-08 HISTORY — PX: COLONOSCOPY WITH PROPOFOL: SHX5780

## 2023-01-08 HISTORY — PX: POLYPECTOMY: SHX5525

## 2023-01-08 LAB — HM COLONOSCOPY

## 2023-01-08 SURGERY — COLONOSCOPY WITH PROPOFOL
Anesthesia: General

## 2023-01-08 MED ORDER — LACTATED RINGERS IV SOLN
INTRAVENOUS | Status: DC | PRN
Start: 2023-01-08 — End: 2023-01-08

## 2023-01-08 MED ORDER — PROPOFOL 500 MG/50ML IV EMUL
INTRAVENOUS | Status: DC | PRN
  Administered 2023-01-08: 150 ug/kg/min via INTRAVENOUS

## 2023-01-08 MED ORDER — PROPOFOL 10 MG/ML IV BOLUS
INTRAVENOUS | Status: DC | PRN
  Administered 2023-01-08: 100 mg via INTRAVENOUS
  Administered 2023-01-08: 50 mg via INTRAVENOUS

## 2023-01-08 MED ORDER — LIDOCAINE HCL (CARDIAC) PF 100 MG/5ML IV SOSY
PREFILLED_SYRINGE | INTRAVENOUS | Status: DC | PRN
  Administered 2023-01-08: 50 mg via INTRAVENOUS

## 2023-01-08 NOTE — Anesthesia Postprocedure Evaluation (Signed)
Anesthesia Post Note  Patient: KRISHNA XIA  Procedure(s) Performed: COLONOSCOPY WITH PROPOFOL POLYPECTOMY  Patient location during evaluation: PACU Anesthesia Type: General Level of consciousness: awake and alert Pain management: pain level controlled Vital Signs Assessment: post-procedure vital signs reviewed and stable Respiratory status: spontaneous breathing, nonlabored ventilation, respiratory function stable and patient connected to nasal cannula oxygen Cardiovascular status: blood pressure returned to baseline and stable Postop Assessment: no apparent nausea or vomiting Anesthetic complications: no   There were no known notable events for this encounter.   Last Vitals:  Vitals:   01/08/23 0807 01/08/23 1118  BP: (!) 145/86 (!) 113/58  Pulse: 64 73  Resp: (!) 22 15  Temp: 36.7 C (!) 36.4 C  SpO2: 100% 96%    Last Pain:  Vitals:   01/08/23 1118  TempSrc: Axillary  PainSc: 0-No pain                 Marcia Hartwell L Jamisha Hoeschen

## 2023-01-08 NOTE — Interval H&P Note (Signed)
History and Physical Interval Note:  01/08/2023 8:27 AM  Christina Miles  has presented today for surgery, with the diagnosis of Constipation.  The various methods of treatment have been discussed with the patient and family. After consideration of risks, benefits and other options for treatment, the patient has consented to  Procedure(s) with comments: COLONOSCOPY WITH PROPOFOL (N/A) - 9:30am;asa 2 as a surgical intervention.  The patient's history has been reviewed, patient examined, no change in status, stable for surgery.  I have reviewed the patient's chart and labs.  Questions were answered to the patient's satisfaction.     Katrinka Blazing Mayorga

## 2023-01-08 NOTE — Anesthesia Preprocedure Evaluation (Addendum)
Anesthesia Evaluation  Patient identified by MRN, date of birth, ID band Patient awake    Reviewed: Allergy & Precautions, H&P , NPO status , Patient's Chart, lab work & pertinent test results, reviewed documented beta blocker date and time   Airway Mallampati: II  TM Distance: >3 FB Neck ROM: full    Dental no notable dental hx. (+) Dental Advisory Given, Teeth Intact   Pulmonary former smoker   Pulmonary exam normal breath sounds clear to auscultation       Cardiovascular Exercise Tolerance: Good hypertension, Normal cardiovascular exam Rhythm:regular Rate:Normal     Neuro/Psych  PSYCHIATRIC DISORDERS Anxiety Depression    glaucoma negative neurological ROS     GI/Hepatic negative GI ROS,,,(+) Hepatitis -, C  Endo/Other  negative endocrine ROS    Renal/GU negative Renal ROS  negative genitourinary   Musculoskeletal   Abdominal   Peds  Hematology negative hematology ROS (+)   Anesthesia Other Findings   Reproductive/Obstetrics negative OB ROS                              Anesthesia Physical Anesthesia Plan  ASA: 3  Anesthesia Plan: General   Post-op Pain Management: Minimal or no pain anticipated   Induction:   PONV Risk Score and Plan: Propofol infusion  Airway Management Planned: Nasal Cannula and Natural Airway  Additional Equipment: None  Intra-op Plan:   Post-operative Plan:   Informed Consent: I have reviewed the patients History and Physical, chart, labs and discussed the procedure including the risks, benefits and alternatives for the proposed anesthesia with the patient or authorized representative who has indicated his/her understanding and acceptance.     Dental Advisory Given  Plan Discussed with: CRNA  Anesthesia Plan Comments:         Anesthesia Quick Evaluation

## 2023-01-08 NOTE — Transfer of Care (Signed)
Immediate Anesthesia Transfer of Care Note  Patient: Christina Miles  Procedure(s) Performed: COLONOSCOPY WITH PROPOFOL POLYPECTOMY  Patient Location: Endoscopy Unit  Anesthesia Type:General  Level of Consciousness: awake, alert , and oriented  Airway & Oxygen Therapy: Patient Spontanous Breathing  Post-op Assessment: Report given to RN and Post -op Vital signs reviewed and stable  Post vital signs: Reviewed and stable  Last Vitals:  Vitals Value Taken Time  BP 105/47   Temp 98   Pulse 71   Resp 16   SpO2 98     Last Pain:  Vitals:   01/08/23 0807  TempSrc: Oral  PainSc: 0-No pain      Patients Stated Pain Goal: 7 (01/08/23 0807)  Complications: No notable events documented.

## 2023-01-08 NOTE — Discharge Instructions (Signed)
You are being discharged to home.  Resume your previous diet.  We are waiting for your pathology results.  Your physician has recommended a repeat colonoscopy for surveillance based on pathology results.  

## 2023-01-08 NOTE — Anesthesia Procedure Notes (Signed)
Date/Time: 01/08/2023 10:53 AM  Performed by: Julian Reil, CRNAPre-anesthesia Checklist: Patient identified, Emergency Drugs available, Suction available and Patient being monitored Patient Re-evaluated:Patient Re-evaluated prior to induction Oxygen Delivery Method: Nasal cannula Induction Type: IV induction Placement Confirmation: positive ETCO2

## 2023-01-08 NOTE — Op Note (Signed)
[facility] Patient Name: Christina Miles Procedure Date: 01/08/2023 10:39 AM MRN: [id number] Date of Birth: [birth year] Attending MD: Katrinka Blazing , , [id number] CSN: [id number] Age: 77 Admit Type: Outpatient Procedure:                Colonoscopy Indications:              Surveillance: Personal history of adenomatous                            polyps on last colonoscopy > 5 years ago Providers:                Katrinka Blazing, Nena Polio, RN, Kristine L. Jessee Avers, Technician Referring MD:              Medicines:                Monitored Anesthesia Care Complications:            No immediate complications. Estimated Blood Loss:     Estimated blood loss: none. Procedure:                Pre-Anesthesia Assessment:                           - Prior to the procedure, a History and Physical                            was performed, and patient medications, allergies                            and sensitivities were reviewed. The patient's                            tolerance of previous anesthesia was reviewed.                           - The risks and benefits of the procedure and the                            sedation options and risks were discussed with the                            patient. All questions were answered and informed                            consent was obtained.                           - ASA Grade Assessment: II - A patient with mild                            systemic disease.                           After obtaining informed consent, the colonoscope  was passed under direct vision. Throughout the                            procedure, the patient's blood pressure, pulse, and                            oxygen saturations were monitored continuously. The                            [id number]-HQ190L ([id number]) scope was introduced through                            the anus and advanced to the the cecum,  identified                            by appendiceal orifice and ileocecal valve. The                            colonoscopy was performed without difficulty. The                            patient tolerated the procedure well. The quality                            of the bowel preparation was excellent. Scope In: 10:52:29 AM Scope Out: 11:12:00 AM Scope Withdrawal Time: 0 hours 13 minutes 20 seconds  Total Procedure Duration: 0 hours 19 minutes 31 seconds  Findings:      The perianal and digital rectal examinations were normal.      Two sessile polyps were found in the descending colon. The polyps were 4       to 5 mm in size. These polyps were removed with a cold snare. Resection       and retrieval were complete.      The retroflexed view of the distal rectum and anal verge was normal and       showed no anal or rectal abnormalities. Impression:               - Two 4 to 5 mm polyps in the descending colon,                            removed with a cold snare. Resected and retrieved.                           - The distal rectum and anal verge are normal on                            retroflexion view. Moderate Sedation:      Per Anesthesia Care Recommendation:           - Discharge patient to home (ambulatory).                           - Resume previous diet.                           -  Await pathology results.                           - Repeat colonoscopy for surveillance based on                            pathology results. Procedure Code(s):        --- Professional ---                           [phone], Colonoscopy, flexible; with removal of                            tumor(s), polyp(s), or other lesion(s) by snare                            technique Diagnosis Code(s):        --- Professional ---                           Z86.010, Personal history of colonic polyps                           D12.4, Benign neoplasm of descending colon CPT copyright 2022 American Medical  Association. All rights reserved. The codes documented in this report are preliminary and upon coder review may  be revised to meet current compliance requirements. Katrinka Blazing, MD Katrinka Blazing,  01/08/2023 11:22:02 AM This report has been signed electronically. Number of Addenda: 0

## 2023-01-09 ENCOUNTER — Encounter (INDEPENDENT_AMBULATORY_CARE_PROVIDER_SITE_OTHER): Payer: Self-pay | Admitting: *Deleted

## 2023-01-09 LAB — SURGICAL PATHOLOGY

## 2023-01-10 ENCOUNTER — Encounter (INDEPENDENT_AMBULATORY_CARE_PROVIDER_SITE_OTHER): Payer: Self-pay | Admitting: *Deleted

## 2023-01-16 ENCOUNTER — Encounter (HOSPITAL_COMMUNITY): Payer: Self-pay | Admitting: Gastroenterology

## 2023-01-23 ENCOUNTER — Ambulatory Visit (INDEPENDENT_AMBULATORY_CARE_PROVIDER_SITE_OTHER): Payer: Medicare Other | Admitting: Family Medicine

## 2023-01-23 ENCOUNTER — Other Ambulatory Visit: Payer: Self-pay

## 2023-01-23 ENCOUNTER — Encounter: Payer: Self-pay | Admitting: Family Medicine

## 2023-01-23 VITALS — BP 145/82 | HR 71 | Ht 62.0 in | Wt 144.1 lb

## 2023-01-23 DIAGNOSIS — I10 Essential (primary) hypertension: Secondary | ICD-10-CM

## 2023-01-23 DIAGNOSIS — E559 Vitamin D deficiency, unspecified: Secondary | ICD-10-CM

## 2023-01-23 DIAGNOSIS — E7849 Other hyperlipidemia: Secondary | ICD-10-CM | POA: Diagnosis not present

## 2023-01-23 DIAGNOSIS — R772 Abnormality of alphafetoprotein: Secondary | ICD-10-CM

## 2023-01-23 DIAGNOSIS — F324 Major depressive disorder, single episode, in partial remission: Secondary | ICD-10-CM

## 2023-01-23 MED ORDER — AMLODIPINE BESYLATE 10 MG PO TABS: 10.0000 mg | ORAL_TABLET | Freq: Every day | ORAL | 5 refills | Status: DC

## 2023-01-23 MED ORDER — BLOOD PRESSURE KIT: PACK | 0 refills | Status: AC

## 2023-01-23 NOTE — Assessment & Plan Note (Signed)
Hyperlipidemia:Low fat diet discussed and encouraged.   Lipid Panel  Lab Results  Component Value Date   CHOL 217 (H) 11/01/2022   HDL 78 11/01/2022   LDLCALC 128 (H) 11/01/2022   TRIG 64 11/01/2022   CHOLHDL 2.8 11/01/2022     Updated lab needed at/ before next visit.

## 2023-01-23 NOTE — Assessment & Plan Note (Signed)
Uncontrolled , increase to amlodipine 10 mg daily, contiue hydrochlorothiazide 25 mg daily. DASH diet and commitment to daily physical activity for a minimum of 30 minutes discussed and encouraged, as a part of hypertension management. The importance of attaining a healthy weight is also discussed.     01/23/2023    9:39 AM 01/23/2023    9:14 AM 01/23/2023    9:13 AM 01/08/2023   11:18 AM 01/08/2023    8:07 AM 12/18/2022    8:59 AM 12/18/2022    8:56 AM  BP/Weight  Systolic BP 145 135 146 113 145 127 145  Diastolic BP 82 79 81 58 86 84 77  Wt. (Lbs)   144.12    141.7  BMI   26.36 kg/m2    25.5 kg/m2     Home bP monitoring also

## 2023-01-23 NOTE — Assessment & Plan Note (Signed)
Followed by GI

## 2023-01-23 NOTE — Assessment & Plan Note (Signed)
Updated lab needed at/ before next visit.   

## 2023-01-23 NOTE — Progress Notes (Signed)
   Christina Miles     MRN: [id number]      DOB: 1945-10-28  Chief Complaint  Patient presents with   Hypertension    Follow up    HPI Christina Miles is here for follow up and re-evaluation of uncontrolled hypertension. Reports tolerance  to meds, states BP at home generally under 130/80 she is compliant with medications, states she would like a "new bP cuff" Used her ciuff in the office and bP values are hte same as we get which are still too high ROS Denies recent fever or chills. Denies sinus pressure, nasal congestion, ear pain or sore throat. Denies chest congestion, productive cough or wheezing. Denies chest pains, palpitations and leg swelling Denies abdominal pain, nausea, vomiting,diarrhea or constipation.   Denies dysuria, frequency, hesitancy or incontinence. Denies joint pain, swelling and limitation in mobility. Denies headaches, seizures, numbness, or tingling. Denies depression, anxiety or insomnia. Denies skin break down or rash.   PE  BP (!) 145/82   Pulse 71   Ht 5\' 2"  (1.575 m)   Wt 144 lb 1.9 oz (65.4 kg)   SpO2 96%   BMI 26.36 kg/m   Patient alert and oriented and in no cardiopulmonary distress.  HEENT: No facial asymmetry, EOMI,     Neck supple .  Chest: Clear to auscultation bilaterally.  CVS: S1, S2 no murmurs, no S3.Regular rate.  ABD: Soft non tender.   Ext: No edema  MS: Adequate ROM spine, shoulders, hips and knees.  Skin: Intact, no ulcerations or rash noted.  Psych: Good eye contact, normal affect. Memory intact not anxious or depressed appearing.  CNS: CN 2-12 intact, power,  normal throughout.no focal deficits noted.   Assessment & Plan

## 2023-01-23 NOTE — Assessment & Plan Note (Signed)
Controlled, no change in medication  

## 2023-01-23 NOTE — Patient Instructions (Addendum)
Annual exam first week in February or shortly after, call if you need me sooner  Fasting  lipid, cmp and EGFR, Vit D to be drawn 3 to 5 days before visit  Shingrix and covid vaccines are due and are available at your pharmacy  Nurse please order BP cuff for pt and also give her blood pressure log \\New higher dose amlodipine start today is 10 mg once daily.  New script is sent to pick up on 01/26/2023  Starting today take one additional amlodipine 2.5 mg tab   And as of tomorrow take TWO amlodipine 5 mg tabs together to equakl 10 mg every  Thanks for choosing [facility], we consider it a privelige to serve you.

## 2023-01-30 ENCOUNTER — Other Ambulatory Visit: Payer: Self-pay | Admitting: Family Medicine

## 2023-02-08 ENCOUNTER — Encounter (HOSPITAL_COMMUNITY): Payer: Self-pay

## 2023-02-08 ENCOUNTER — Ambulatory Visit (HOSPITAL_COMMUNITY)
Admission: RE | Admit: 2023-02-08 | Discharge: 2023-02-08 | Disposition: A | Discharge status: Home / Self Care | Payer: Medicare Other | Source: Ambulatory Visit | Attending: Family Medicine | Admitting: Family Medicine

## 2023-02-08 DIAGNOSIS — Z1231 Encounter for screening mammogram for malignant neoplasm of breast: Secondary | ICD-10-CM | POA: Insufficient documentation

## 2023-03-25 ENCOUNTER — Ambulatory Visit: Payer: Self-pay | Admitting: Family Medicine

## 2023-03-25 NOTE — Telephone Encounter (Signed)
 Chief Complaint: Cough Symptoms: Cough, chest tightness when coughing, runny nose, headache Frequency: two weeks Pertinent Negatives: Patient denies fever Disposition: [] ED /[] Urgent Care (no appt availability in office) / [x] Appointment(In office/virtual)/ []  [facility]/ [] Home Care/ [] Refused Recommended Disposition /[] [facility]/ []  Follow-up with PCP Additional Notes: Patient called in stating she is experiencing a cough for about two weeks, and is now experiencing some tightness in chest with coughing spells. Patient states her cough is productive and she is coughing up yellow phlegm. Patient states she also has yellow nasal drip and a headache. Patient states her husband has been recently diagnosed with Pneumonia and she worries she may have gotten sick from him. Made patient appt for earliest available appt slot in office. Advised patient to visit nearest ED if her chest tightness is constant and she begins running a fever.    Copied from CRM [id number] [id number]. Topic: Clinical - Red Word Triage >> Mar 25, 2023  1:22 PM Laurier C wrote: Red Word that prompted transfer to Nurse Triage: Patient has been coughing more than normal, she is concerned because her husband has pneumonia and has been having the same symptoms: congestion, no fever, aching cough/ tightness in chest Reason for Disposition  [1] Nasal discharge AND [2] present > 10 days  Answer Assessment - Initial Assessment Questions 1. ONSET: When did the cough begin?      Two weeks ago 2. SEVERITY: How bad is the cough today?      Coughing a lot and feel like I can't get my breath 3. SPUTUM: Describe the color of your sputum (none, dry cough; clear, white, yellow, green)     Yellow 4. HEMOPTYSIS: Are you coughing up any blood? If so ask: How much? (flecks, streaks, tablespoons, etc.)     No blood 5. DIFFICULTY BREATHING: Are you having difficulty breathing? If Yes, ask: How bad is it? (e.g., mild,  moderate, severe)    - MILD: No SOB at rest, mild SOB with walking, speaks normally in sentences, can lie down, no retractions, pulse < 100.    - MODERATE: SOB at rest, SOB with minimal exertion and prefers to sit, cannot lie down flat, speaks in phrases, mild retractions, audible wheezing, pulse 100-120.    - SEVERE: Very SOB at rest, speaks in single words, struggling to breathe, sitting hunched forward, retractions, pulse > 120      I am a little bit short of breath today, it feels like my chest is getting a little tight 6. FEVER: Do you have a fever? If Yes, ask: What is your temperature, how was it measured, and when did it start?     No, most recent temp was 97.7 7. CARDIAC HISTORY: Do you have any history of heart disease? (e.g., heart attack, congestive heart failure)      No 8. LUNG HISTORY: Do you have any history of lung disease?  (e.g., pulmonary embolus, asthma, emphysema)     No 9. PE RISK FACTORS: Do you have a history of blood clots? (or: recent major surgery, recent prolonged travel, bedridden)     No 10. OTHER SYMPTOMS: Do you have any other symptoms? (e.g., runny nose, wheezing, chest pain)       Headache, chest tightness, runny nose  Protocols used: Cough - Acute Productive-A-AH

## 2023-03-26 ENCOUNTER — Encounter: Payer: Self-pay | Admitting: Family Medicine

## 2023-03-26 ENCOUNTER — Ambulatory Visit: Payer: Self-pay | Admitting: Family Medicine

## 2023-03-26 VITALS — BP 135/71 | HR 85 | Ht 62.0 in | Wt 138.1 lb

## 2023-03-26 DIAGNOSIS — J069 Acute upper respiratory infection, unspecified: Secondary | ICD-10-CM | POA: Diagnosis not present

## 2023-03-26 MED ORDER — BENZONATATE 200 MG PO CAPS: 200.0000 mg | ORAL_CAPSULE | Freq: Two times a day (BID) | ORAL | 0 refills | Status: DC | PRN

## 2023-03-26 MED ORDER — PREDNISONE 20 MG PO TABS: 20.0000 mg | ORAL_TABLET | Freq: Every day | ORAL | 0 refills | Status: AC

## 2023-03-26 MED ORDER — AZITHROMYCIN 250 MG PO TABS
ORAL_TABLET | ORAL | 0 refills | Status: DC
Start: 2023-03-26 — End: 2023-04-30

## 2023-03-26 NOTE — Progress Notes (Signed)
 Established Patient Office Visit   Subjective  Patient ID: Christina Miles, female    DOB: [birth year]  Age: 78 y.o. MRN: [id number]  Chief Complaint  Patient presents with   Acute Visit    Sinus pressure, headaches, coughing, chest tightness, runny nose since 03/15/23     She  has a past medical history of Arthritis, Cataract, Depression (2014), Encounter for Medicare annual examination with abnormal findings (04/17/2022), Glaucoma (2018), Hepatitis C, Hypertension, and Reduced vision (2015).  Patient complains of persistent cough. Patient describes symptoms of shortness of breath, fatigue, headache, malaise, sputum production, and wheezing. Symptoms began several weeks ago and are gradually worsening since that time. Patient denies chest pain or nausea and vomiting. Treatment thus far includes OTC analgesics/antipyretics: not very effective and anti-tussive: not very effective Past pulmonary history is significant for no history of pneumonia or bronchitis     Review of Systems  Constitutional:  Positive for malaise/fatigue. Negative for chills and fever.  HENT:  Positive for congestion.   Respiratory:  Positive for cough, shortness of breath and wheezing.   Cardiovascular:  Negative for chest pain.  Neurological:  Positive for headaches. Negative for dizziness.      Objective:     BP 135/71   Pulse 85   Ht 5' 2 (1.575 m)   Wt 138 lb 1.3 oz (62.6 kg)   SpO2 95%   BMI 25.26 kg/m  BP Readings from Last 3 Encounters:  03/26/23 135/71  01/23/23 (!) 145/82  01/08/23 (!) 113/58      Physical Exam Vitals reviewed.  Constitutional:      General: She is not in acute distress.    Appearance: Normal appearance. She is not ill-appearing, toxic-appearing or diaphoretic.  HENT:     Head: Normocephalic.     Nose: Congestion and rhinorrhea present.     Mouth/Throat:     Pharynx: Posterior oropharyngeal erythema present.  Eyes:     General:        Right eye: No discharge.         Left eye: No discharge.     Conjunctiva/sclera: Conjunctivae normal.  Cardiovascular:     Rate and Rhythm: Normal rate.     Pulses: Normal pulses.     Heart sounds: Normal heart sounds.  Pulmonary:     Effort: Pulmonary effort is normal. No respiratory distress.     Breath sounds: Wheezing and rhonchi present.  Skin:    General: Skin is warm and dry.     Capillary Refill: Capillary refill takes less than 2 seconds.  Neurological:     Mental Status: She is alert.  Psychiatric:        Mood and Affect: Mood normal.        Behavior: Behavior normal.      No results found for any visits on 03/26/23.  The 10-year ASCVD risk score (Arnett DK, et al., 2019) is: 20.1%    Assessment & Plan:  Upper respiratory tract infection, unspecified type Assessment & Plan: Azithromycin  250 mg twice daily x 5 days,  Prednisone  20 mg x 5 days , Benzonatate  200 mg PRN.  Advise patient to rest to support your body's recovery. Stay hydrated by drinking water , tea, or broth. Using a humidifier can help soothe throat irritation and ease nasal congestion. For fever or pain, acetaminophen (Tylenol) is recommended. To relieve other symptoms, try saline nasal sprays, throat lozenges, or gargling with saltwater. Focus on eating light, healthy meals like fruits and vegetables  to keep your strength up. Practice good hygiene by washing your hands frequently and covering your mouth when coughing or sneezing.Follow-up for worsening or persistent symptoms. Patient verbalizes understanding regarding plan of care and all questions answered    Orders: -     Azithromycin ; Take 2 tablets on day 1, then 1 tablet daily on days 2 through 5  Dispense: 6 tablet; Refill: 0 -     Benzonatate ; Take 1 capsule (200 mg total) by mouth 2 (two) times daily as needed for cough.  Dispense: 20 capsule; Refill: 0  Other orders -     predniSONE ; Take 1 tablet (20 mg total) by mouth daily with breakfast for 5 days.  Dispense: 5  tablet; Refill: 0    Return if symptoms worsen or fail to improve.   Hilario Kidd Wilhelmena Falter, FNP

## 2023-03-26 NOTE — Assessment & Plan Note (Addendum)
 Azithromycin  250 mg  x 5 days,  Prednisone  20 mg once daily x 5 days Advise patient to rest to support your body's recovery. Stay hydrated by drinking water , tea, or broth. Using a humidifier can help soothe throat irritation and ease nasal congestion. For fever or pain, acetaminophen (Tylenol) is recommended. To relieve other symptoms, try saline nasal sprays, throat lozenges, or gargling with saltwater. Focus on eating light, healthy meals like fruits and vegetables to keep your strength up. Practice good hygiene by washing your hands frequently and covering your mouth when coughing or sneezing.Follow-up for worsening or persistent symptoms. Patient verbalizes understanding regarding plan of care and all questions answered

## 2023-03-26 NOTE — Patient Instructions (Signed)

## 2023-04-01 NOTE — Telephone Encounter (Signed)
 Noted , pt treated in office 01/0-09/2023

## 2023-04-03 DIAGNOSIS — H401112 Primary open-angle glaucoma, right eye, moderate stage: Secondary | ICD-10-CM | POA: Diagnosis not present

## 2023-04-03 DIAGNOSIS — H04123 Dry eye syndrome of bilateral lacrimal glands: Secondary | ICD-10-CM | POA: Diagnosis not present

## 2023-04-03 DIAGNOSIS — H401121 Primary open-angle glaucoma, left eye, mild stage: Secondary | ICD-10-CM | POA: Diagnosis not present

## 2023-04-03 DIAGNOSIS — H2512 Age-related nuclear cataract, left eye: Secondary | ICD-10-CM | POA: Diagnosis not present

## 2023-04-26 ENCOUNTER — Encounter: Payer: Medicare Other | Admitting: Family Medicine

## 2023-04-29 ENCOUNTER — Other Ambulatory Visit: Payer: Self-pay | Admitting: Family Medicine

## 2023-04-29 DIAGNOSIS — I10 Essential (primary) hypertension: Secondary | ICD-10-CM | POA: Diagnosis not present

## 2023-04-29 DIAGNOSIS — E7849 Other hyperlipidemia: Secondary | ICD-10-CM | POA: Diagnosis not present

## 2023-04-29 DIAGNOSIS — E559 Vitamin D deficiency, unspecified: Secondary | ICD-10-CM | POA: Diagnosis not present

## 2023-04-30 ENCOUNTER — Encounter: Payer: Self-pay | Admitting: Family Medicine

## 2023-04-30 ENCOUNTER — Ambulatory Visit (INDEPENDENT_AMBULATORY_CARE_PROVIDER_SITE_OTHER): Payer: Medicare Other | Admitting: Family Medicine

## 2023-04-30 VITALS — BP 124/72 | HR 81 | Ht 62.0 in | Wt 141.1 lb

## 2023-04-30 DIAGNOSIS — Z0001 Encounter for general adult medical examination with abnormal findings: Secondary | ICD-10-CM | POA: Diagnosis not present

## 2023-04-30 DIAGNOSIS — I10 Essential (primary) hypertension: Secondary | ICD-10-CM

## 2023-04-30 DIAGNOSIS — Z0000 Encounter for general adult medical examination without abnormal findings: Secondary | ICD-10-CM | POA: Insufficient documentation

## 2023-04-30 DIAGNOSIS — E559 Vitamin D deficiency, unspecified: Secondary | ICD-10-CM | POA: Diagnosis not present

## 2023-04-30 DIAGNOSIS — E7849 Other hyperlipidemia: Secondary | ICD-10-CM

## 2023-04-30 DIAGNOSIS — E785 Hyperlipidemia, unspecified: Secondary | ICD-10-CM

## 2023-04-30 LAB — CMP14+EGFR
ALT: 11 [IU]/L (ref 0–32)
AST: 18 [IU]/L (ref 0–40)
Albumin: 4.2 g/dL (ref 3.8–4.8)
Alkaline Phosphatase: 115 [IU]/L (ref 44–121)
BUN/Creatinine Ratio: 16 (ref 12–28)
BUN: 16 mg/dL (ref 8–27)
Bilirubin Total: 0.6 mg/dL (ref 0.0–1.2)
CO2: 25 mmol/L (ref 20–29)
Calcium: 9.8 mg/dL (ref 8.7–10.3)
Chloride: 102 mmol/L (ref 96–106)
Creatinine, Ser: 1.02 mg/dL — ABNORMAL HIGH (ref 0.57–1.00)
Globulin, Total: 3.2 g/dL (ref 1.5–4.5)
Glucose: 84 mg/dL (ref 70–99)
Potassium: 4.4 mmol/L (ref 3.5–5.2)
Sodium: 142 mmol/L (ref 134–144)
Total Protein: 7.4 g/dL (ref 6.0–8.5)
eGFR: 57 mL/min/{1.73_m2} — ABNORMAL LOW (ref >59)

## 2023-04-30 LAB — LIPID PANEL
Chol/HDL Ratio: 2.8 {ratio} (ref 0.0–4.4)
Cholesterol, Total: 257 mg/dL — ABNORMAL HIGH (ref 100–199)
HDL: 91 mg/dL (ref >39)
LDL Chol Calc (NIH): 154 mg/dL — ABNORMAL HIGH (ref 0–99)
Triglycerides: 71 mg/dL (ref 0–149)
VLDL Cholesterol Cal: 12 mg/dL (ref 5–40)

## 2023-04-30 LAB — VITAMIN D 25 HYDROXY (VIT D DEFICIENCY, FRACTURES): Vit D, 25-Hydroxy: 44.9 ng/mL (ref 30.0–100.0)

## 2023-04-30 MED ORDER — ROSUVASTATIN CALCIUM 10 MG PO TABS: 10.0000 mg | ORAL_TABLET | Freq: Every day | ORAL | 5 refills | Status: DC

## 2023-04-30 NOTE — Assessment & Plan Note (Signed)
Hyperlipidemia:Low fat diet discussed and encouraged.   Lipid Panel  Lab Results  Component Value Date   CHOL 257 (H) 04/29/2023   HDL 91 04/29/2023   LDLCALC 154 (H) 04/29/2023   TRIG 71 04/29/2023   CHOLHDL 2.8 04/29/2023     Start crestor 10 mg daily , re eval in 12 weeks , lower fat intake

## 2023-04-30 NOTE — Assessment & Plan Note (Signed)
DASH diet and commitment to daily physical activity for a minimum of 30 minutes discussed and encouraged, as a part of hypertension management. The importance of attaining a healthy weight is also discussed.     04/30/2023   11:36 AM 04/30/2023   11:35 AM 04/30/2023   11:09 AM 04/30/2023   11:07 AM 03/26/2023    9:25 AM 01/23/2023    9:39 AM 01/23/2023    9:14 AM  BP/Weight  Systolic BP 124 130 136 156 135 145 135  Diastolic BP 72 80 82 77 71 82 79  Wt. (Lbs)    141.12 138.08    BMI    25.81 kg/m2 25.26 kg/m2       Controlled, no change in medication

## 2023-04-30 NOTE — Assessment & Plan Note (Signed)
,  Annual exam as documented. Counseling done  re healthy lifestyle involving commitment to 150 minutes exercise per week, heart healthy diet, and attaining healthy weight.The importance of adequate sleep also discussed.   Immunization and cancer screening needs are specifically addressed at this visit.

## 2023-04-30 NOTE — Patient Instructions (Addendum)
F/U in 13 weeks, call if you ned me sooner  New is rosuvastatin 10 mg daily for cholesterol, need to cut out butter, nuts, egg yolk, cheese, ic cream. Cookies ,cakes, pies!  Increase vegetables  Stay on same dose of BP medication, stopping the amlodipine 2.5 mg tablet is good  Fasting lipid, cmp and EGFR 3  and vit D to 5 days before follow up  Need covid, RSV and Shingrix vaccines, all are at your pharmacy   Additional valentine gift from hubby: eye mask and ear plugs when sleeping, pls pass on this message 58 years and still together, congrats!  Thanks for choosing [facility], we consider it a privelige to serve you.

## 2023-04-30 NOTE — Progress Notes (Signed)
Christina Miles     MRN: [id number]      DOB: [birth year]  Chief Complaint  Patient presents with   Annual Exam    CPE     HPI: Patient is in for annual physical exam. /o intermittent light headedness  And stopped amlodipine 2.5 mg tab. Has BP cuff in  office and correlates with measured BP, though initial reading was high, so I advised to rept check and it normalized Poor sleep as spouse has light and TV on all night. Recent labs,  are reviewed.will start cholesterol med Immunization is reviewed , and  needs to be updated at pahrmacy   PE: BP 124/72   Pulse 81   Ht 5\' 2"  (1.575 m)   Wt 141 lb 1.9 oz (64 kg)   SpO2 95%   BMI 25.81 kg/m   Pleasant  female, alert and oriented x 3, in no cardio-pulmonary distress. Afebrile. HEENT No facial trauma or asymetry. Sinuses non tender.  Extra occullar muscles intact.. External ears normal, . Neck: supple, no adenopathy,JVD or thyromegaly.No bruits.  Chest: Clear to ascultation bilaterally.No crackles or wheezes. Non tender to palpation  Breast: Not examihned , mammogram normal in 01/2023  Cardiovascular system; Heart sounds normal,  S1 and  S2 ,no S3.  No murmur, or thrill. Apical beat not displaced Peripheral pulses normal.  Abdomen: Soft, non tender, no organomegaly or masses. . No guarding, tenderness or rebound.   GU: Asymptomatic, not examined  Musculoskeletal exam: Full ROM of spine, hips , shoulders and knees. No deformity ,swelling or crepitus noted. No muscle wasting or atrophy.   Neurologic: Cranial nerves 2 to 12 intact. Power, tone ,sensation s normal throughout. No disturbance in gait. No tremor.  Skin: Intact, no ulceration, erythema , scaling or rash noted. Pigmentation normal throughout  Psych; Normal mood and affect. Judgement and concentration normal   Assessment & Plan:  Annual visit for general adult medical examination without abnormal findings Annual exam as  documented. Counseling done  re healthy lifestyle involving commitment to 150 minutes exercise per week, heart healthy diet, and attaining healthy weight.The importance of adequate sleep also discussed. Regular seat belt use and home safety, is also discussed. Changes in health habits are decided on by the patient with goals and time frames  set for achieving them. Immunization and cancer screening needs are specifically addressed at this visit.   Encounter for Medicare annual examination with abnormal findings ,Annual exam as documented. Counseling done  re healthy lifestyle involving commitment to 150 minutes exercise per week, heart healthy diet, and attaining healthy weight.The importance of adequate sleep also discussed.   Immunization and cancer screening needs are specifically addressed at this visit.   Hyperlipidemia LDL goal <100 Hyperlipidemia:Low fat diet discussed and encouraged.   Lipid Panel  Lab Results  Component Value Date   CHOL 257 (H) 04/29/2023   HDL 91 04/29/2023   LDLCALC 154 (H) 04/29/2023   TRIG 71 04/29/2023   CHOLHDL 2.8 04/29/2023     Start crestor 10 mg daily , re eval in 12 weeks , lower fat intake  Essential hypertension DASH diet and commitment to daily physical activity for a minimum of 30 minutes discussed and encouraged, as a part of hypertension management. The importance of attaining a healthy weight is also discussed.     04/30/2023   11:36 AM 04/30/2023   11:35 AM 04/30/2023   11:09 AM 04/30/2023   11:07 AM 03/26/2023    9:25 AM  01/23/2023    9:39 AM 01/23/2023    9:14 AM  BP/Weight  Systolic BP 124 130 136 156 135 145 135  Diastolic BP 72 80 82 77 71 82 79  Wt. (Lbs)    141.12 138.08    BMI    25.81 kg/m2 25.26 kg/m2       Controlled, no change in medication

## 2023-04-30 NOTE — Assessment & Plan Note (Signed)
Updated lab needed at/ before next visit.

## 2023-04-30 NOTE — Assessment & Plan Note (Signed)

## 2023-05-13 ENCOUNTER — Other Ambulatory Visit: Payer: Self-pay | Admitting: Family Medicine

## 2023-06-18 ENCOUNTER — Ambulatory Visit (INDEPENDENT_AMBULATORY_CARE_PROVIDER_SITE_OTHER): Payer: Medicare Other | Admitting: Gastroenterology

## 2023-06-18 ENCOUNTER — Encounter (INDEPENDENT_AMBULATORY_CARE_PROVIDER_SITE_OTHER): Payer: Self-pay | Admitting: Gastroenterology

## 2023-06-18 VITALS — BP 118/64 | HR 65 | Temp 98.6°F | Ht 62.0 in | Wt 141.2 lb

## 2023-06-18 DIAGNOSIS — Z8619 Personal history of other infectious and parasitic diseases: Secondary | ICD-10-CM | POA: Diagnosis not present

## 2023-06-18 DIAGNOSIS — Z09 Encounter for follow-up examination after completed treatment for conditions other than malignant neoplasm: Secondary | ICD-10-CM | POA: Diagnosis not present

## 2023-06-18 DIAGNOSIS — Z860101 Personal history of adenomatous and serrated colon polyps: Secondary | ICD-10-CM | POA: Diagnosis not present

## 2023-06-18 DIAGNOSIS — Z800 Family history of malignant neoplasm of digestive organs: Secondary | ICD-10-CM

## 2023-06-18 NOTE — Patient Instructions (Signed)
-  update AFP lab, pending results, we may repeat liver imaging, I will be in touch regarding this  -I will discuss timing of next colonoscopy with Dr. Levon Hedger  Follow up 1 year  It was a pleasure to see you today. I want to create trusting relationships with patients and provide genuine, compassionate, and quality care. I truly value your feedback! please be on the lookout for a survey regarding your visit with me today. I appreciate your input about our visit and your time in completing this!    Christina Miles L. Jeanmarie Hubert, MSN, APRN, AGNP-C Adult-Gerontology Nurse Practitioner [facility] at [facility]

## 2023-06-18 NOTE — Progress Notes (Signed)
 Referring Provider: Kerri Perches, MD Primary Care Physician:  Kerri Perches, MD Primary GI Physician: Dr. Levon Hedger   Chief Complaint  Patient presents with   Follow-up    Pt arrives for 6 month follow up. No issues/concerns at this time. Pt did have elevated cholesterol at last doctor appt but she is working to get that down. Pt mentioned having pleghm stuck in throat-has been going on for a while and believes it may be due to pollen.     HPI:   Christina Miles is a 78 y.o. female with past medical history of history of hepatitis C, HTN, and depression.   Patient presenting today for follow up  Last seen October 2024, at that time patient reported belching, left upper quadrant pain.  Some issues with constipation.  Patient remained to be mindful of high-fiber foods, reflux precautions, good water intake, schedule screening colonoscopy, use IBgard for occasional belching, no need for further HCC screening  Present: Not currently taking anything for GERD. She denies any GERD symptoms. No abdominal pain. Belching has resolved. Denies constipation. She has changed her diet some and had some diarrhea last week which she thinks was related to her food intake, this self limiting. Having a BM usually every other morning otherwise. No rectal bleeding or melena. Denies changes in appetite or unintended weight loss.    Last Colonoscopy:12/2022 2 polyps-TAs Repeat in 7 years   Past Medical History:  Diagnosis Date   Arthritis    Not sure of date first noted   Cataract    Depression 2014   Encounter for Medicare annual examination with abnormal findings 04/17/2022   Glaucoma 2018   Hepatitis C    Hypertension    Reduced vision 2015   right, had hemmorhage, treated by [facility], injections    Past Surgical History:  Procedure Laterality Date   ABDOMINAL HYSTERECTOMY  1985   . ? mass on ovary   APPENDECTOMY     BACK SURGERY     CHOLECYSTECTOMY     COLONOSCOPY   02/20/2012   Procedure: COLONOSCOPY;  Surgeon: Malissa Hippo, MD;  [facility]: [facility];  Service: Endoscopy;  Laterality: N/A;  930   COLONOSCOPY     Patient states that this was 3-4 years ago @[facility]    COLONOSCOPY N/A 10/24/2017   Rehman: small polyp at splenic flexure and distal sigmoid colon, external hemorrhoids. (Tubular adenoma)   COLONOSCOPY WITH PROPOFOL N/A 01/08/2023   Procedure: COLONOSCOPY WITH PROPOFOL;  Surgeon: Dolores Frame, MD;  Location: [facility];  Service: Gastroenterology;  Laterality: N/A;  9:30am;asa 2   EYE SURGERY  2019   GLAUCOMA SURGERY     05/2017   POLYPECTOMY  10/24/2017   Procedure: POLYPECTOMY;  Surgeon: Malissa Hippo, MD;  Location: [facility];  Service: Endoscopy;;  colon   POLYPECTOMY  01/08/2023   Procedure: POLYPECTOMY;  Surgeon: Marguerita Merles, Reuel Boom, MD;  Location: [facility];  Service: Gastroenterology;;   SPINE SURGERY  1998 approx   [place]    Current Outpatient Medications  Medication Sig Dispense Refill   amLODipine (NORVASC) 10 MG tablet Take 1 tablet (10 mg total) by mouth daily. 30 tablet 5   azelastine (ASTELIN) 0.1 % nasal spray Place 2 sprays into both nostrils 2 (two) times daily. Use in each nostril as directed 30 mL 1   Blood Pressure KIT Use to monitor blood pressure daily 1 kit 0   brimonidine-timolol (COMBIGAN) 0.2-0.5 % ophthalmic solution  Place 1 drop into both eyes every 12 (twelve) hours.     Calcium Carbonate-Vitamin D 600-400 MG-UNIT tablet Take 2 tablets by mouth daily.     fluticasone (FLONASE) 50 MCG/ACT nasal spray Place 2 sprays into both nostrils daily. 16 g 6   hydrochlorothiazide (HYDRODIURIL) 25 MG tablet TAKE (1) TABLET BY MOUTH ONCE DAILY. 90 tablet 3   latanoprost (XALATAN) 0.005 % ophthalmic solution Place 1 drop into both eyes at bedtime.     mirtazapine (REMERON) 7.5 MG tablet TAKE (1) TABLET BY MOUTH AT BEDTIME. 30 tablet 5   potassium chloride (KLOR-CON) 10 MEQ tablet TAKE (1)  TABLET BY MOUTH TWICE DAILY. 180 tablet 3   prednisoLONE acetate (PRED FORTE) 1 % ophthalmic suspension Place 1 drop into the right eye daily at 6 (six) AM.     rosuvastatin (CRESTOR) 10 MG tablet Take 1 tablet (10 mg total) by mouth daily. 30 tablet 5   Vitamin D, Ergocalciferol, (DRISDOL) 1.25 MG (50000 UNIT) CAPS capsule Take 1 capsule (50,000 Units total) by mouth every 7 (seven) days. 5 capsule 8   No current facility-administered medications for this visit.    Allergies as of 06/18/2023 - Review Complete 06/18/2023  Allergen Reaction Noted   Ace inhibitors Cough 10/23/2011    Social History   Socioeconomic History   Marital status: Married    Spouse name: Not on file   Number of children: Not on file   Years of education: Not on file   Highest education level: Associate degree: occupational, Scientist, product/process development, or vocational program  Occupational History   Not on file  Tobacco Use   Smoking status: Former    Current packs/day: 0.00    Average packs/day: 1 pack/day for 10.0 years (10.0 ttl pk-yrs)    Types: Cigarettes    Start date: 11/30/1980    Quit date: 12/01/1990    Years since quitting: 32.5    Passive exposure: Current   Smokeless tobacco: Never  Vaping Use   Vaping status: Never Used  Substance and Sexual Activity   Alcohol use: No   Drug use: No   Sexual activity: Yes  Other Topics Concern   Not on file  Social History Narrative   Not on file   Social Drivers of Health   Financial Resource Strain: Low Risk  (01/22/2023)   Overall Financial Resource Strain (CARDIA)    Difficulty of Paying Living Expenses: Not hard at all  Food Insecurity: No Food Insecurity (01/22/2023)   Hunger Vital Sign    Worried About Running Out of Food in the Last Year: Never true    Ran Out of Food in the Last Year: Never true  Transportation Needs: No Transportation Needs (01/22/2023)   PRAPARE - Administrator, Civil Service (Medical): No    Lack of Transportation  (Non-Medical): No  Physical Activity: Sufficiently Active (01/22/2023)   Exercise Vital Sign    Days of Exercise per Week: 5 days    Minutes of Exercise per Session: 30 min  Recent Concern: Physical Activity - Insufficiently Active (10/24/2022)   Exercise Vital Sign    Days of Exercise per Week: 3 days    Minutes of Exercise per Session: 20 min  Stress: No Stress Concern Present (01/22/2023)   [employer] of Occupational Health - Occupational Stress Questionnaire    Feeling of Stress : Not at all  Social Connections: Socially Integrated (01/22/2023)   Social Connection and Isolation Panel [NHANES]    Frequency of Communication  with Friends and Family: Three times a week    Frequency of Social Gatherings with Friends and Family: Twice a week    Attends Religious Services: More than 4 times per year    Active Member of [employer] or Organizations: Yes    Attends Engineer, structural: More than 4 times per year    Marital Status: Married    Review of systems General: negative for malaise, night sweats, fever, chills, weight loss Neck: Negative for lumps, goiter, pain and significant neck swelling Resp: Negative for cough, wheezing, dyspnea at rest CV: Negative for chest pain, leg swelling, palpitations, orthopnea GI: denies melena, hematochezia, nausea, vomiting, diarrhea, constipation, dysphagia, odyonophagia, early satiety or unintentional weight loss.  The remainder of the review of systems is noncontributory.  Physical Exam: There were no vitals taken for this visit. General:   Alert and oriented. No distress noted. Pleasant and cooperative.  Head:  Normocephalic and atraumatic. Eyes:  Conjuctiva clear without scleral icterus. Mouth:  Oral mucosa pink and moist. Good dentition. No lesions. Heart: Normal rate and rhythm, s1 and s2 heart sounds present.  Lungs: Clear lung sounds in all lobes. Respirations equal and unlabored. Abdomen:  +BS, soft, non-tender and  non-distended. No rebound or guarding. No HSM or masses noted. Neurologic:  Alert and  oriented x4 Psych:  Alert and cooperative. Normal mood and affect.  Invalid input(s): "6 MONTHS"   ASSESSMENT: Christina Miles is a 78 y.o. female presenting today for follow up  Abdominal pain and belching have resolved. She has no GI complaints today  Recent Colonoscopy in October 2024 with 2 TAs, recommended to have repeat in 7 years. Patient reports previously having Colonoscopy every 5 years due to CRC history in her father, though he was 62 at time of diagnosis, therefore, this does not necessarily raise  her risk of CRC. Will discuss with Dr. Levon Hedger but will likely plan to proceed with repeat TCS in 7 years.   History of Hep C with SVR achieved in 2019, s/p Epclusa. Last elastography 11/2021 with kPa 4.7, and no obvious changes of the liver. She had been having HCC screening via AFP every 6 months, though no clear reason why as she did not meet criteria for hepatoma screening. Last AFP in April was 15.1, peak of 17 in 2020. Given ongoing elevation in AFP, screening was continued. Most recent LFTs in February 2025 were WNL, plt count in August was 257k. Given previously elevated AFP, will update this, may consider repeat liver imaging pending AFP results, though at this time, no real indication for this.  PLAN:  -update AFP, consider repeat liver imaging pending results -discuss timing of next colonoscopy with Dr. Salena Saner (5 or 7 years due to family history)  All questions were answered, patient verbalized understanding and is in agreement with plan as outlined above.   Follow Up: 1 year   Herman Mell L. Jeanmarie Hubert, MSN, APRN, AGNP-C Adult-Gerontology Nurse Practitioner [facility]  I have reviewed the note and agree with the APP's assessment as described in this progress note  Father was diagnosed with colon cancer after age 32, so his risk was the average risk for cancer.  If the  patient is anxious about waiting 7 years to have her repeat colonoscopy, we can go back to every 5-year colonoscopy interval.  Katrinka Blazing, MD [facility]ology and [facility]atology [facility]

## 2023-06-25 DIAGNOSIS — Z8619 Personal history of other infectious and parasitic diseases: Secondary | ICD-10-CM | POA: Diagnosis not present

## 2023-06-25 DIAGNOSIS — K739 Chronic hepatitis, unspecified: Secondary | ICD-10-CM | POA: Diagnosis not present

## 2023-06-25 DIAGNOSIS — K080 Exfoliation of teeth due to systemic causes: Secondary | ICD-10-CM | POA: Diagnosis not present

## 2023-06-25 DIAGNOSIS — R772 Abnormality of alphafetoprotein: Secondary | ICD-10-CM | POA: Diagnosis not present

## 2023-06-25 DIAGNOSIS — Z8719 Personal history of other diseases of the digestive system: Secondary | ICD-10-CM | POA: Diagnosis not present

## 2023-06-26 LAB — AFP TUMOR MARKER: AFP-Tumor Marker: 13.3 ng/mL — ABNORMAL HIGH

## 2023-06-27 ENCOUNTER — Encounter (INDEPENDENT_AMBULATORY_CARE_PROVIDER_SITE_OTHER): Payer: Self-pay

## 2023-06-27 ENCOUNTER — Other Ambulatory Visit (INDEPENDENT_AMBULATORY_CARE_PROVIDER_SITE_OTHER): Payer: Self-pay | Admitting: Gastroenterology

## 2023-06-27 DIAGNOSIS — R772 Abnormality of alphafetoprotein: Secondary | ICD-10-CM

## 2023-07-08 ENCOUNTER — Encounter (INDEPENDENT_AMBULATORY_CARE_PROVIDER_SITE_OTHER): Payer: Self-pay

## 2023-07-08 ENCOUNTER — Ambulatory Visit (HOSPITAL_COMMUNITY)
Admission: RE | Admit: 2023-07-08 | Discharge: 2023-07-08 | Disposition: A | Discharge status: Home / Self Care | Source: Ambulatory Visit | Attending: Gastroenterology | Admitting: Gastroenterology

## 2023-07-08 DIAGNOSIS — R772 Abnormality of alphafetoprotein: Secondary | ICD-10-CM | POA: Insufficient documentation

## 2023-07-08 DIAGNOSIS — B1920 Unspecified viral hepatitis C without hepatic coma: Secondary | ICD-10-CM | POA: Diagnosis not present

## 2023-07-08 DIAGNOSIS — Z9049 Acquired absence of other specified parts of digestive tract: Secondary | ICD-10-CM | POA: Diagnosis not present

## 2023-07-24 DIAGNOSIS — Z961 Presence of intraocular lens: Secondary | ICD-10-CM | POA: Diagnosis not present

## 2023-07-24 DIAGNOSIS — H401112 Primary open-angle glaucoma, right eye, moderate stage: Secondary | ICD-10-CM | POA: Diagnosis not present

## 2023-07-24 DIAGNOSIS — H401121 Primary open-angle glaucoma, left eye, mild stage: Secondary | ICD-10-CM | POA: Diagnosis not present

## 2023-07-24 DIAGNOSIS — H2512 Age-related nuclear cataract, left eye: Secondary | ICD-10-CM | POA: Diagnosis not present

## 2023-07-25 ENCOUNTER — Encounter (HOSPITAL_COMMUNITY): Payer: Self-pay

## 2023-07-25 DIAGNOSIS — E7849 Other hyperlipidemia: Secondary | ICD-10-CM | POA: Diagnosis not present

## 2023-07-25 DIAGNOSIS — I10 Essential (primary) hypertension: Secondary | ICD-10-CM | POA: Diagnosis not present

## 2023-07-25 DIAGNOSIS — E559 Vitamin D deficiency, unspecified: Secondary | ICD-10-CM | POA: Diagnosis not present

## 2023-07-26 LAB — CMP14+EGFR
ALT: 11 IU/L (ref 0–32)
AST: 13 IU/L (ref 0–40)
Albumin: 4.3 g/dL (ref 3.8–4.8)
Alkaline Phosphatase: 94 IU/L (ref 44–121)
BUN/Creatinine Ratio: 13 (ref 12–28)
BUN: 12 mg/dL (ref 8–27)
Bilirubin Total: 0.8 mg/dL (ref 0.0–1.2)
CO2: 24 mmol/L (ref 20–29)
Calcium: 10 mg/dL (ref 8.7–10.3)
Chloride: 104 mmol/L (ref 96–106)
Creatinine, Ser: 0.93 mg/dL (ref 0.57–1.00)
Globulin, Total: 2.9 g/dL (ref 1.5–4.5)
Glucose: 89 mg/dL (ref 70–99)
Potassium: 4.2 mmol/L (ref 3.5–5.2)
Sodium: 142 mmol/L (ref 134–144)
Total Protein: 7.2 g/dL (ref 6.0–8.5)
eGFR: 64 mL/min/{1.73_m2} (ref >59)

## 2023-07-26 LAB — LIPID PANEL
Chol/HDL Ratio: 2.8 ratio (ref 0.0–4.4)
Cholesterol, Total: 219 mg/dL — ABNORMAL HIGH (ref 100–199)
HDL: 78 mg/dL (ref >39)
LDL Chol Calc (NIH): 129 mg/dL — ABNORMAL HIGH (ref 0–99)
Triglycerides: 66 mg/dL (ref 0–149)
VLDL Cholesterol Cal: 12 mg/dL (ref 5–40)

## 2023-07-26 LAB — VITAMIN D 25 HYDROXY (VIT D DEFICIENCY, FRACTURES): Vit D, 25-Hydroxy: 52.2 ng/mL (ref 30.0–100.0)

## 2023-07-31 ENCOUNTER — Other Ambulatory Visit: Payer: Self-pay | Admitting: Family Medicine

## 2023-08-01 ENCOUNTER — Ambulatory Visit (INDEPENDENT_AMBULATORY_CARE_PROVIDER_SITE_OTHER): Payer: Medicare Other | Admitting: Family Medicine

## 2023-08-01 ENCOUNTER — Encounter: Payer: Self-pay | Admitting: Family Medicine

## 2023-08-01 ENCOUNTER — Other Ambulatory Visit (HOSPITAL_COMMUNITY): Payer: Self-pay | Admitting: Family Medicine

## 2023-08-01 VITALS — BP 128/80 | HR 80 | Resp 18 | Ht 62.0 in | Wt 138.1 lb

## 2023-08-01 DIAGNOSIS — M5410 Radiculopathy, site unspecified: Secondary | ICD-10-CM | POA: Diagnosis not present

## 2023-08-01 DIAGNOSIS — J302 Other seasonal allergic rhinitis: Secondary | ICD-10-CM

## 2023-08-01 DIAGNOSIS — E785 Hyperlipidemia, unspecified: Secondary | ICD-10-CM | POA: Diagnosis not present

## 2023-08-01 DIAGNOSIS — F5104 Psychophysiologic insomnia: Secondary | ICD-10-CM

## 2023-08-01 DIAGNOSIS — I10 Essential (primary) hypertension: Secondary | ICD-10-CM

## 2023-08-01 DIAGNOSIS — Z1231 Encounter for screening mammogram for malignant neoplasm of breast: Secondary | ICD-10-CM

## 2023-08-01 MED ORDER — HYDROCHLOROTHIAZIDE 25 MG PO TABS: ORAL_TABLET | ORAL | 3 refills | Status: DC

## 2023-08-01 MED ORDER — POTASSIUM CHLORIDE ER 10 MEQ PO TBCR: EXTENDED_RELEASE_TABLET | ORAL | 3 refills | Status: AC

## 2023-08-01 NOTE — Patient Instructions (Addendum)
 Follow-up in 5 months, call if you need me sooner.  Please schedule mammogram at checkout.  No change in current medications.  Congrats on improved health habits, it is worth it.!!  Try drinking 6 to 8 ounces skimmed milk daily for calcium  for bone health  Fasting lipid Chem-7 and EGFR , CBC and TSH 3 to 5 days before next visit.  Please do get your shingles vaccines at the pharmacy.  ,Thanks for choosing [facility], we consider it a privelige to serve you.

## 2023-08-01 NOTE — Progress Notes (Signed)
   Christina Miles     MRN: [id number]      DOB: 03/04/1946  Chief Complaint  Patient presents with   Hypertension    13 week follow up    Numbness    Complains of numbness on side of right foot and right pinky toe x3 days. No known injury.     HPI Christina Miles is here for follow up and re-evaluation of chronic medical conditions, medication management and review of any available recent lab and radiology data.  Preventive health is updated, specifically  Cancer screening and Immunization.   Questions or concerns regarding consultations or procedures which the PT has had in the interim are  addressed. The PT denies any adverse reactions to current medications since the last visit.  Concern as above   ROS Denies recent fever or chills. Denies sinus pressure, nasal congestion, ear pain or sore throat. Denies chest congestion, productive cough or wheezing. Denies chest pains, palpitations and leg swelling Denies abdominal pain, nausea, vomiting,diarrhea or constipation.   Denies dysuria, frequency, hesitancy or incontinence. Denies joint pain, swelling and limitation in mobility. Denies headaches, seizures, numbness, or tingling. Denies depression, anxiety or insomnia. Denies skin break down or rash.   PE  BP 128/80   Pulse 80   Resp 18   Ht 5\' 2"  (1.575 m)   Wt 138 lb 1.9 oz (62.7 kg)   SpO2 94%   BMI 25.26 kg/m    Patient alert and oriented and in no cardiopulmonary distress.  HEENT: No facial asymmetry, EOMI,     Neck supple .  Chest: Clear to auscultation bilaterally.  CVS: S1, S2 no murmurs, no S3.Regular rate.  ABD: Soft non tender.   Ext: No edema  MS: Adequate ROM spine, shoulders, hips and knees.  Skin: Intact, no ulcerations or rash noted.  Psych: Good eye contact, normal affect. Memory intact not anxious or depressed appearing.  CNS: CN 2-12 intact, power,  normal throughout.no focal deficits noted.   Assessment & Plan  Essential  hypertension Controlled, no change in medication DASH diet and commitment to daily physical activity for a minimum of 30 minutes discussed and encouraged, as a part of hypertension management. The importance of attaining a healthy weight is also discussed.     08/01/2023    9:30 AM 08/01/2023    9:00 AM 06/18/2023   10:16 AM 04/30/2023   11:36 AM 04/30/2023   11:35 AM 04/30/2023   11:09 AM 04/30/2023   11:07 AM  BP/Weight  Systolic BP 128 150 118 124 130 136 156  Diastolic BP 80 77 64 72 80 82 77  Wt. (Lbs)  138.12 141.2    141.12  BMI  25.26 kg/m2 25.83 kg/m2    25.81 kg/m2       Hyperlipidemia LDL goal <100 Hyperlipidemia:Low fat diet discussed and encouraged.   Lipid Panel  Lab Results  Component Value Date   CHOL 219 (H) 07/25/2023   HDL 78 07/25/2023   LDLCALC 129 (H) 07/25/2023   TRIG 66 07/25/2023   CHOLHDL 2.8 07/25/2023     Improved , still not at goal , diet only pt opted out of meds Updated lab needed at/ before next visit.   Insomnia Controlled, no change in medication   Back pain with right-sided radiculopathy Recent inc in symptoms with numbness in foot  Seasonal allergies Controlled, no change in medication

## 2023-08-04 NOTE — Assessment & Plan Note (Signed)
 Recent inc in symptoms with numbness in foot

## 2023-08-04 NOTE — Assessment & Plan Note (Signed)
 Controlled, no change in medication DASH diet and commitment to daily physical activity for a minimum of 30 minutes discussed and encouraged, as a part of hypertension management. The importance of attaining a healthy weight is also discussed.     08/01/2023    9:30 AM 08/01/2023    9:00 AM 06/18/2023   10:16 AM 04/30/2023   11:36 AM 04/30/2023   11:35 AM 04/30/2023   11:09 AM 04/30/2023   11:07 AM  BP/Weight  Systolic BP 128 150 118 124 130 136 156  Diastolic BP 80 77 64 72 80 82 77  Wt. (Lbs)  138.12 141.2    141.12  BMI  25.26 kg/m2 25.83 kg/m2    25.81 kg/m2

## 2023-08-04 NOTE — Assessment & Plan Note (Signed)
 Hyperlipidemia:Low fat diet discussed and encouraged.   Lipid Panel  Lab Results  Component Value Date   CHOL 219 (H) 07/25/2023   HDL 78 07/25/2023   LDLCALC 129 (H) 07/25/2023   TRIG 66 07/25/2023   CHOLHDL 2.8 07/25/2023     Improved , still not at goal , diet only pt opted out of meds Updated lab needed at/ before next visit.

## 2023-08-04 NOTE — Assessment & Plan Note (Signed)
 Controlled, no change in medication

## 2023-08-06 ENCOUNTER — Other Ambulatory Visit: Payer: Self-pay | Admitting: Family Medicine

## 2023-08-20 ENCOUNTER — Other Ambulatory Visit: Payer: Self-pay | Admitting: Family Medicine

## 2023-09-02 DIAGNOSIS — H401112 Primary open-angle glaucoma, right eye, moderate stage: Secondary | ICD-10-CM | POA: Diagnosis not present

## 2023-09-02 DIAGNOSIS — H401121 Primary open-angle glaucoma, left eye, mild stage: Secondary | ICD-10-CM | POA: Diagnosis not present

## 2023-09-09 ENCOUNTER — Ambulatory Visit (INDEPENDENT_AMBULATORY_CARE_PROVIDER_SITE_OTHER): Payer: Medicare Other

## 2023-09-09 VITALS — BP 107/62 | Ht 62.0 in | Wt 134.5 lb

## 2023-09-09 DIAGNOSIS — Z0000 Encounter for general adult medical examination without abnormal findings: Secondary | ICD-10-CM

## 2023-09-09 DIAGNOSIS — Z780 Asymptomatic menopausal state: Secondary | ICD-10-CM | POA: Diagnosis not present

## 2023-09-09 NOTE — Patient Instructions (Signed)
 Christina Miles ,  Thank you for taking time out of your busy schedule to complete your Annual Wellness Visit with me. I enjoyed our conversation and look forward to speaking with you again next year. I, as well as your care team,  appreciate your ongoing commitment to your health goals. Please review the following plan we discussed and let me know if I can assist you in the future.  I enjoyed our conversation and look forward to it again next year. Blessing for the upcoming year!!  -Christina Miles  Your Game plan/ To Do List    Referrals Placed:  An order for a bone density scan was placed for you today Please call the number below to schedule your appt. [facility] [phone]  Follow up Visits: Medicare AWV with health advisor: September 09, 2024 at 12:30pm telephone visit    Next appointment with PCP: January 01, 2024  Clinician Recommendations:  Aim for 30 minutes of exercise or brisk walking, 6-8 glasses of water , and 5 servings of fruits and vegetables each day.       This is a list of the screening recommended for you and due dates:  Health Maintenance  Topic Date Due   Zoster (Shingles) Vaccine (2 of 2) 12/21/2021   DEXA scan (bone density measurement)  01/31/2022   COVID-19 Vaccine (6 - 2024-25 season) 11/18/2022   Flu Shot  10/18/2023   Mammogram  02/08/2024   Medicare Annual Wellness Visit  09/08/2024   DTaP/Tdap/Td vaccine (3 - Td or Tdap) 10/04/2024   Colon Cancer Screening  01/07/2030   Pneumococcal Vaccine for age over 58  Completed   Hepatitis C Screening  Completed   HPV Vaccine  Aged Out   Meningitis B Vaccine  Aged Out    Advanced directives: (Provided) Advance directive discussed with you today. I have provided a copy for you to complete at home and have notarized. Once this is complete, please bring a copy in to our office so we can scan it into your chart.  Advance Care Planning is important because it:  [x]  Makes sure you receive the medical care that is  consistent with your values, goals, and preferences  [x]  It provides guidance to your family and loved ones and reduces their decisional burden about whether or not they are making the right decisions based on your wishes.  Follow the link provided in your after visit summary or read over the paperwork we have mailed to you to help you started getting your Advance Directives in place. If you need assistance in completing these, please reach out to us  so that we can help you!  Understanding Your Risk for Falls Millions of people have serious injuries from falls each year. It is important to understand your risk of falling. Talk with your health care provider about your risk and what you can do to lower it. If you do have a serious fall, make sure to tell your provider. Falling once raises your risk of falling again. How can falls affect me? Serious injuries from falls are common. These include: Broken bones, such as hip fractures. Head injuries, such as traumatic brain injuries (TBI) or concussions. A fear of falling can cause you to avoid activities and stay at home. This can make your muscles weaker and raise your risk for a fall. What can increase my risk? There are a number of risk factors that increase your risk for falling. The more risk factors you have, the higher your risk  of falling. Serious injuries from a fall happen most often to people who are older than 76 years old. Teenagers and young adults ages 10-29 are also at higher risk. Common risk factors include: Weakness in the lower body. Being generally weak or confused due to long-term (chronic) illness. Dizziness or balance problems. Poor vision. Medicines that cause dizziness or drowsiness. These may include: Medicines for your blood pressure, heart, anxiety, insomnia, or swelling (edema). Pain medicines. Muscle relaxants. Other risk factors include: Drinking alcohol. Having had a fall in the past. Having foot pain or wearing  improper footwear. Working at a dangerous job. Having any of the following in your home: Tripping hazards, such as floor clutter or loose rugs. Poor lighting. Pets. Having dementia or memory loss. What actions can I take to lower my risk of falling?     Physical activity Stay physically fit. Do strength and balance exercises. Consider taking a regular class to build strength and balance. Yoga and tai chi are good options. Vision Have your eyes checked every year and your prescription for glasses or contacts updated as needed. Shoes and walking aids Wear non-skid shoes. Wear shoes that have rubber soles and low heels. Do not wear high heels. Do not walk around the house in socks or slippers. Use a cane or walker as told by your provider. Home safety Attach secure railings on both sides of your stairs. Install grab bars for your bathtub, shower, and toilet. Use a non-skid mat in your bathtub or shower. Attach bath mats securely with double-sided, non-slip rug tape. Use good lighting in all rooms. Keep a flashlight near your bed. Make sure there is a clear path from your bed to the bathroom. Use night-lights. Do not use throw rugs. Make sure all carpeting is taped or tacked down securely. Remove all clutter from walkways and stairways, including extension cords. Repair uneven or broken steps and floors. Avoid walking on icy or slippery surfaces. Walk on the grass instead of on icy or slick sidewalks. Use ice melter to get rid of ice on walkways in the winter. Use a cordless phone. Questions to ask your health care provider Can you help me check my risk for a fall? Do any of my medicines make me more likely to fall? Should I take a vitamin D  supplement? What exercises can I do to improve my strength and balance? Should I make an appointment to have my vision checked? Do I need a bone density test to check for weak bones (osteoporosis)? Would it help to use a cane or a walker? Where  to find more information Centers for Disease Control and Prevention, STEAD[employer]: TonerPromos.no Community-Based Fall Prevention Programs: TonerPromos.no [employer] on Aging: BaseRingTones.pl Contact a health care provider if: You fall at home. You are afraid of falling at home. You feel weak, drowsy, or dizzy. This information is not intended to replace advice given to you by your health care provider. Make sure you discuss any questions you have with your health care provider. Document Revised: 11/06/2021 Document Reviewed: 11/06/2021 Elsevier Patient Education  2024 [employer].

## 2023-09-09 NOTE — Progress Notes (Signed)
 Subjective:   Christina Miles is a 78 y.o. who presents for a Medicare Wellness preventive visit.  As a reminder, Annual Wellness Visits don't include a physical exam, and some assessments may be limited, especially if this visit is performed virtually. We may recommend an in-person follow-up visit with your provider if needed.  Visit Complete: Virtual I connected with  Christina Miles on 09/09/23 by a audio enabled telemedicine application and verified that I am speaking with the correct person using two identifiers.  Patient Location: Home  Provider Location: Office/Clinic  I discussed the limitations of evaluation and management by telemedicine. The patient expressed understanding and agreed to proceed.  Vital Signs: Because this visit was a virtual/telehealth visit, some criteria may be missing or patient reported. Any vitals not documented were not able to be obtained and vitals that have been documented are patient reported.  VideoDeclined- This patient declined Librarian, academic. Therefore the visit was completed with audio only.  Persons Participating in Visit: Patient.  AWV Questionnaire: Yes: Patient Medicare AWV questionnaire was completed by the patient on 09/09/2023; I have confirmed that all information answered by patient is correct and no changes since this date.  Cardiac Risk Factors include: advanced age (>20men, >75 women);dyslipidemia;hypertension     Objective:    Today's Vitals   09/09/23 1349  BP: 107/62  Weight: 134 lb 8 oz (61 kg)  Height: 5' 2 (1.575 m)   Body mass index is 24.6 kg/m.     09/09/2023    1:47 PM 01/08/2023    8:02 AM 01/04/2023    3:33 PM 09/05/2022    8:45 AM 08/28/2021    9:34 AM 08/17/2020    1:09 PM 10/20/2019    3:30 PM  Advanced Directives  Does Patient Have a Medical Advance Directive? No No No No No No No  Does patient want to make changes to medical advance directive?      No - Patient  declined   Would patient like information on creating a medical advance directive? Yes (MAU/Ambulatory/Procedural Areas - Information given) No - Guardian declined No - Patient declined No - Patient declined Yes (ED - Information included in AVS)  Yes (MAU/Ambulatory/Procedural Areas - Information given)    Current Medications (verified) Outpatient Encounter Medications as of 09/09/2023  Medication Sig   amLODipine  (NORVASC ) 10 MG tablet TAKE ONE TABLET BY MOUTH EVERY DAY   azelastine  (ASTELIN ) 0.1 % nasal spray PLACE 2 SPRAYS INTO EACH NOSTRIL TWO TIMES DAILY AS DIRECTED   Blood Pressure KIT Use to monitor blood pressure daily   brimonidine-timolol  (COMBIGAN) 0.2-0.5 % ophthalmic solution Place 1 drop into both eyes every 12 (twelve) hours.   fluticasone  (FLONASE ) 50 MCG/ACT nasal spray Place 2 sprays into both nostrils daily.   hydrochlorothiazide  (HYDRODIURIL ) 25 MG tablet TAKE HALF TABLET TABLET BY MOUTH ONCE DAILY.   mirtazapine  (REMERON ) 7.5 MG tablet TAKE (1) TABLET BY MOUTH AT BEDTIME.   Netarsudil-Latanoprost  0.02-0.005 % SOLN Instill one drop into both eyes at bedtime   potassium chloride  (KLOR-CON ) 10 MEQ tablet TAKE (1) TABLET BY MOUTH ONCE DAILY.   prednisoLONE  acetate (PRED FORTE ) 1 % ophthalmic suspension Place 1 drop into the right eye daily at 6 (six) AM.   Vitamin D , Ergocalciferol , (DRISDOL ) 1.25 MG (50000 UNIT) CAPS capsule Take 1 capsule (50,000 Units total) by mouth every 7 (seven) days.   rosuvastatin  (CRESTOR ) 10 MG tablet Take 1 tablet (10 mg total) by mouth daily. (Patient not  taking: Reported on 09/09/2023)   No facility-administered encounter medications on file as of 09/09/2023.    Allergies (verified) Ace inhibitors   History: Past Medical History:  Diagnosis Date   Arthritis    Not sure of date first noted   Cataract    Depression 2014   Encounter for Medicare annual examination with abnormal findings 04/17/2022   Glaucoma 2018   Hepatitis C     Hypertension    Reduced vision 2015   right, had hemmorhage, treated by Will, injections   Past Surgical History:  Procedure Laterality Date   ABDOMINAL HYSTERECTOMY  1985   . ? mass on ovary   APPENDECTOMY     BACK SURGERY     CHOLECYSTECTOMY     COLONOSCOPY  02/20/2012   Procedure: COLONOSCOPY;  Surgeon: Claudis RAYMOND Rivet, MD;  [facility];  Service: Endoscopy;  Laterality: N/A;  930   COLONOSCOPY     Patient states that this was 3-4 years ago @[facility]    COLONOSCOPY N/A 10/24/2017   Rehman: small polyp at splenic flexure and distal sigmoid colon, external hemorrhoids. (Tubular adenoma)   COLONOSCOPY WITH PROPOFOL  N/A 01/08/2023   Procedure: COLONOSCOPY WITH PROPOFOL ;  Surgeon: Eartha Angelia Sieving, MD;  Location: [facility];  Service: Gastroenterology;  Laterality: N/A;  9:30am;asa 2   EYE SURGERY  2019   GLAUCOMA SURGERY     05/2017   POLYPECTOMY  10/24/2017   Procedure: POLYPECTOMY;  Surgeon: Rivet Claudis RAYMOND, MD;  Location: [facility];  Service: Endoscopy;;  colon   POLYPECTOMY  01/08/2023   Procedure: POLYPECTOMY;  Surgeon: Eartha Angelia Sieving, MD;  Location: [facility];  Service: Gastroenterology;;   SPINE SURGERY  1998 approx   [place]   Family History  Problem Relation Age of Onset   Colon cancer Father    Cancer Father    Dementia Mother    Diabetes Brother    Hypertension Brother    Hypertension Brother    Social History   Socioeconomic History   Marital status: Married    Spouse name: Not on file   Number of children: Not on file   Years of education: Not on file   Highest education level: Associate degree: occupational, Scientist, product/process development, or vocational program  Occupational History   Not on file  Tobacco Use   Smoking status: Former    Current packs/day: 0.00    Average packs/day: 1 pack/day for 10.0 years (10.0 ttl pk-yrs)    Types: Cigarettes    Start date: 11/30/1980    Quit date: 12/01/1990    Years since quitting: 32.7     Passive exposure: Current   Smokeless tobacco: Never  Vaping Use   Vaping status: Never Used  Substance and Sexual Activity   Alcohol use: No   Drug use: No   Sexual activity: Yes  Other Topics Concern   Not on file  Social History Narrative   Not on file   Social Drivers of Health   Financial Resource Strain: Low Risk  (09/09/2023)   Overall Financial Resource Strain (CARDIA)    Difficulty of Paying Living Expenses: Not hard at all  Food Insecurity: No Food Insecurity (09/09/2023)   Hunger Vital Sign    Worried About Running Out of Food in the Last Year: Never true    Ran Out of Food in the Last Year: Never true  Transportation Needs: No Transportation Needs (09/09/2023)   PRAPARE - Administrator, Civil Service (Medical):  No    Lack of Transportation (Non-Medical): No  Physical Activity: Sufficiently Active (09/09/2023)   Exercise Vital Sign    Days of Exercise per Week: 7 days    Minutes of Exercise per Session: 30 min  Stress: No Stress Concern Present (09/09/2023)   [employer] of Occupational Health - Occupational Stress Questionnaire    Feeling of Stress: Not at all  Social Connections: Socially Integrated (09/09/2023)   Social Connection and Isolation Panel    Frequency of Communication with Friends and Family: More than three times a week    Frequency of Social Gatherings with Friends and Family: More than three times a week    Attends Religious Services: More than 4 times per year    Active Member of [employer] or Organizations: Yes    Attends Engineer, structural: More than 4 times per year    Marital Status: Married    Tobacco Counseling Counseling given: Yes    Clinical Intake:  Pre-visit preparation completed: Yes  Pain : No/denies pain     BMI - recorded: 24.6 Nutritional Status: BMI of 19-24  Normal Nutritional Risks: None Diabetes: No  Lab Results  Component Value Date   HGBA1C 5.0 08/25/2007     How often do you need  to have someone help you when you read instructions, pamphlets, or other written materials from your doctor or pharmacy?: 1 - Never  Interpreter Needed?: No  Information entered by :: Stefano ORN CMA   Activities of Daily Living     09/09/2023   10:06 AM 01/04/2023    3:33 PM  In your present state of health, do you have any difficulty performing the following activities:  Hearing? 0   Vision? 0   Difficulty concentrating or making decisions? 0   Walking or climbing stairs? 0   Dressing or bathing? 0   Doing errands, shopping? 0 0  Preparing Food and eating ? N   Using the Toilet? N   In the past six months, have you accidently leaked urine? Y   Do you have problems with loss of bowel control? N   Managing your Medications? N   Managing your Finances? N   Housekeeping or managing your Housekeeping? N     Patient Care Team: Antonetta Rollene BRAVO, MD as PCP - General Alvia Norleen BIRCH, MD as Consulting Physician (Ophthalmology) Lavona Agent, MD as Consulting Physician (Cardiology) Octavia Bruckner, MD as Consulting Physician (Ophthalmology) Mariette Mitzie CROME, NP as Nurse Practitioner (Gastroenterology) Margrette Taft BRAVO, MD as Consulting Physician (Orthopedic Surgery)  I have updated your Care Teams any recent Medical Services you may have received from other providers in the past year.     Assessment:   This is a routine wellness examination for [place].  Hearing/Vision screen Hearing Screening - Comments:: Patient denies any hearing difficulties.   Vision Screening - Comments:: Wears rx glasses - up to date with routine eye exams  Patient sees Dr. Bruckner Octavia in [place]   Goals Addressed             This Visit's Progress    DIET - INCREASE WATER  INTAKE   On track    Wants to drink more water       LIFESTYLE - DECREASE FALLS RISK   On track    Patient Stated   On track    Patients goal is to remain as active and healthy as she currently is.         Depression Screen  09/09/2023    1:52 PM 08/01/2023    9:02 AM 04/30/2023   11:09 AM 01/23/2023    9:14 AM 12/07/2022   10:28 AM 10/25/2022   10:41 AM 09/05/2022    8:42 AM  PHQ 2/9 Scores  PHQ - 2 Score 0 0 0 0 0 0 0  PHQ- 9 Score 0          Fall Risk     09/09/2023   10:06 AM 08/01/2023    9:02 AM 04/30/2023   11:09 AM 03/26/2023    9:59 AM 01/23/2023    9:14 AM  Fall Risk   Falls in the past year? 0 0 0 0 0  Number falls in past yr: 0 0 0 0 0  Injury with Fall? 0 0 0 0 0  Risk for fall due to : No Fall Risks  No Fall Risks No Fall Risks No Fall Risks  Follow up Falls evaluation completed;Education provided;Falls prevention discussed Falls evaluation completed Falls evaluation completed Falls evaluation completed Falls evaluation completed    MEDICARE RISK AT HOME:  Medicare Risk at Home Any stairs in or around the home?: (Patient-Rptd) Yes If so, are there any without handrails?: (Patient-Rptd) No Home free of loose throw rugs in walkways, pet beds, electrical cords, etc?: (Patient-Rptd) Yes Adequate lighting in your home to reduce risk of falls?: (Patient-Rptd) Yes Life alert?: (Patient-Rptd) No Use of a cane, walker or w/c?: (Patient-Rptd) No Grab bars in the bathroom?: (Patient-Rptd) Yes Shower chair or bench in shower?: (Patient-Rptd) Yes Elevated toilet seat or a handicapped toilet?: (Patient-Rptd) Yes  TIMED UP AND GO:  Was the test performed?  No  Cognitive Function: 6CIT completed        09/09/2023    1:51 PM 09/05/2022    8:47 AM 08/28/2021    9:39 AM 07/27/2019    8:18 AM 07/17/2018    2:02 PM  6CIT Screen  What Year? 0 points 0 points 0 points 0 points 0 points  What month? 0 points 0 points 0 points 0 points 0 points  What time? 0 points 0 points 0 points 0 points 0 points  Count back from 20 0 points 0 points 0 points 0 points 0 points  Months in reverse 0 points 0 points 0 points 0 points 0 points  Repeat phrase 0 points 0 points 0 points 0 points 0  points  Total Score 0 points 0 points 0 points 0 points 0 points    Immunizations Immunization History  Administered Date(s) Administered   Fluad Quad(high Dose 65+) 12/23/2018, 01/04/2020, 04/12/2021, 02/05/2022   Fluad Trivalent(High Dose 65+) 12/07/2022   Influenza Split 01/23/2012   Influenza Whole 12/18/2005   Influenza,inj,Quad PF,6+ Mos 12/18/2012, 01/26/2014, 03/15/2015, 01/26/2016, 01/03/2017, 01/07/2018   PFIZER(Purple Top)SARS-COV-2 Vaccination 05/09/2019, 06/01/2019, 12/15/2019, 01/20/2022   Pfizer Covid-19 Vaccine Bivalent Booster 80yrs & up 01/13/2021   Pneumococcal Conjugate-13 10/28/2013   Pneumococcal Polysaccharide-23 12/18/2012   Td 08/09/2003   Tdap 10/05/2014   Zoster Recombinant(Shingrix) 10/26/2021   Zoster, Live 01/23/2012    Screening Tests Health Maintenance  Topic Date Due   Zoster Vaccines- Shingrix (2 of 2) 12/21/2021   DEXA SCAN  01/31/2022   COVID-19 Vaccine (6 - 2024-25 season) 11/18/2022   INFLUENZA VACCINE  10/18/2023   MAMMOGRAM  02/08/2024   Medicare Annual Wellness (AWV)  09/08/2024   DTaP/Tdap/Td (3 - Td or Tdap) 10/04/2024   Colonoscopy  01/07/2030   Pneumococcal Vaccine: 50+ Years  Completed   Hepatitis  C Screening  Completed   HPV VACCINES  Aged Out   Meningococcal B Vaccine  Aged Out    Health Maintenance  Health Maintenance Due  Topic Date Due   Zoster Vaccines- Shingrix (2 of 2) 12/21/2021   DEXA SCAN  01/31/2022   COVID-19 Vaccine (6 - 2024-25 season) 11/18/2022   Health Maintenance Items Addressed: DEXA ordered  Additional Screening:  Vision Screening: Recommended annual ophthalmology exams for early detection of glaucoma and other disorders of the eye. Would you like a referral to an eye doctor? No    Dental Screening: Recommended annual dental exams for proper oral hygiene  Community Resource Referral / Chronic Care Management: CRR required this visit?  No   CCM required this visit?  No   Plan:    I have  personally reviewed and noted the following in the patient's chart:   Medical and social history Use of alcohol, tobacco or illicit drugs  Current medications and supplements including opioid prescriptions. Patient is not currently taking opioid prescriptions. Functional ability and status Nutritional status Physical activity Advanced directives List of other physicians Hospitalizations, surgeries, and ER visits in previous 12 months Vitals Screenings to include cognitive, depression, and falls Referrals and appointments  In addition, I have reviewed and discussed with patient certain preventive protocols, quality metrics, and best practice recommendations. A written personalized care plan for preventive services as well as general preventive health recommendations were provided to patient.   Aadon Gorelik, CMA   09/09/2023   After Visit Summary: (MyChart) Due to this being a telephonic visit, the after visit summary with patients personalized plan was offered to patient via MyChart   Notes: Nothing significant to report at this time.

## 2023-10-22 DIAGNOSIS — H401112 Primary open-angle glaucoma, right eye, moderate stage: Secondary | ICD-10-CM | POA: Diagnosis not present

## 2023-10-22 DIAGNOSIS — H401121 Primary open-angle glaucoma, left eye, mild stage: Secondary | ICD-10-CM | POA: Diagnosis not present

## 2023-10-22 DIAGNOSIS — H04123 Dry eye syndrome of bilateral lacrimal glands: Secondary | ICD-10-CM | POA: Diagnosis not present

## 2023-10-22 DIAGNOSIS — H2512 Age-related nuclear cataract, left eye: Secondary | ICD-10-CM | POA: Diagnosis not present

## 2023-11-11 ENCOUNTER — Other Ambulatory Visit: Payer: Self-pay | Admitting: Family Medicine

## 2023-11-20 ENCOUNTER — Other Ambulatory Visit: Payer: Self-pay | Admitting: Family Medicine

## 2023-12-30 DIAGNOSIS — K080 Exfoliation of teeth due to systemic causes: Secondary | ICD-10-CM | POA: Diagnosis not present

## 2024-01-01 ENCOUNTER — Ambulatory Visit: Admitting: Family Medicine

## 2024-01-01 ENCOUNTER — Encounter (INDEPENDENT_AMBULATORY_CARE_PROVIDER_SITE_OTHER): Payer: Self-pay | Admitting: Gastroenterology

## 2024-01-21 DIAGNOSIS — I10 Essential (primary) hypertension: Secondary | ICD-10-CM | POA: Diagnosis not present

## 2024-01-21 DIAGNOSIS — E785 Hyperlipidemia, unspecified: Secondary | ICD-10-CM | POA: Diagnosis not present

## 2024-01-22 ENCOUNTER — Ambulatory Visit: Payer: Self-pay | Admitting: Family Medicine

## 2024-01-22 LAB — CBC WITH DIFFERENTIAL/PLATELET
Basophils Absolute: 0 x10E3/uL (ref 0.0–0.2)
Basos: 1 %
EOS (ABSOLUTE): 0.1 x10E3/uL (ref 0.0–0.4)
Eos: 1 %
Hematocrit: 41.2 % (ref 34.0–46.6)
Hemoglobin: 13.2 g/dL (ref 11.1–15.9)
Immature Grans (Abs): 0 x10E3/uL (ref 0.0–0.1)
Immature Granulocytes: 0 %
Lymphocytes Absolute: 2.4 x10E3/uL (ref 0.7–3.1)
Lymphs: 44 %
MCH: 30.9 pg (ref 26.6–33.0)
MCHC: 32 g/dL (ref 31.5–35.7)
MCV: 97 fL (ref 79–97)
Monocytes Absolute: 0.4 x10E3/uL (ref 0.1–0.9)
Monocytes: 8 %
Neutrophils Absolute: 2.4 x10E3/uL (ref 1.4–7.0)
Neutrophils: 46 %
Platelets: 254 x10E3/uL (ref 150–450)
RBC: 4.27 x10E6/uL (ref 3.77–5.28)
RDW: 12.2 % (ref 11.7–15.4)
WBC: 5.3 x10E3/uL (ref 3.4–10.8)

## 2024-01-22 LAB — CMP14+EGFR
ALT: 12 IU/L (ref 0–32)
AST: 18 IU/L (ref 0–40)
Albumin: 4.3 g/dL (ref 3.8–4.8)
Alkaline Phosphatase: 102 IU/L (ref 49–135)
BUN/Creatinine Ratio: 18 (ref 12–28)
BUN: 17 mg/dL (ref 8–27)
Bilirubin Total: 0.7 mg/dL (ref 0.0–1.2)
CO2: 25 mmol/L (ref 20–29)
Calcium: 9.8 mg/dL (ref 8.7–10.3)
Chloride: 103 mmol/L (ref 96–106)
Creatinine, Ser: 0.97 mg/dL (ref 0.57–1.00)
Globulin, Total: 3 g/dL (ref 1.5–4.5)
Glucose: 89 mg/dL (ref 70–99)
Potassium: 4.4 mmol/L (ref 3.5–5.2)
Sodium: 140 mmol/L (ref 134–144)
Total Protein: 7.3 g/dL (ref 6.0–8.5)
eGFR: 61 mL/min/1.73 (ref >59)

## 2024-01-22 LAB — LIPID PANEL
Chol/HDL Ratio: 2.8 ratio (ref 0.0–4.4)
Cholesterol, Total: 242 mg/dL — ABNORMAL HIGH (ref 100–199)
HDL: 86 mg/dL (ref >39)
LDL Chol Calc (NIH): 144 mg/dL — ABNORMAL HIGH (ref 0–99)
Triglycerides: 73 mg/dL (ref 0–149)
VLDL Cholesterol Cal: 12 mg/dL (ref 5–40)

## 2024-01-22 LAB — TSH: TSH: 2.19 u[IU]/mL (ref 0.450–4.500)

## 2024-01-28 ENCOUNTER — Ambulatory Visit: Payer: Self-pay | Admitting: Family Medicine

## 2024-01-31 DIAGNOSIS — H2512 Age-related nuclear cataract, left eye: Secondary | ICD-10-CM | POA: Diagnosis not present

## 2024-01-31 DIAGNOSIS — H401121 Primary open-angle glaucoma, left eye, mild stage: Secondary | ICD-10-CM | POA: Diagnosis not present

## 2024-01-31 DIAGNOSIS — Z961 Presence of intraocular lens: Secondary | ICD-10-CM | POA: Diagnosis not present

## 2024-01-31 DIAGNOSIS — H401112 Primary open-angle glaucoma, right eye, moderate stage: Secondary | ICD-10-CM | POA: Diagnosis not present

## 2024-02-04 ENCOUNTER — Encounter: Payer: Self-pay | Admitting: Family Medicine

## 2024-02-04 ENCOUNTER — Ambulatory Visit (INDEPENDENT_AMBULATORY_CARE_PROVIDER_SITE_OTHER): Admitting: Family Medicine

## 2024-02-04 VITALS — BP 129/69 | HR 76 | Resp 16 | Ht 62.0 in | Wt 132.1 lb

## 2024-02-04 DIAGNOSIS — Z23 Encounter for immunization: Secondary | ICD-10-CM | POA: Diagnosis not present

## 2024-02-04 DIAGNOSIS — E559 Vitamin D deficiency, unspecified: Secondary | ICD-10-CM | POA: Diagnosis not present

## 2024-02-04 DIAGNOSIS — E785 Hyperlipidemia, unspecified: Secondary | ICD-10-CM | POA: Diagnosis not present

## 2024-02-04 DIAGNOSIS — I10 Essential (primary) hypertension: Secondary | ICD-10-CM

## 2024-02-04 DIAGNOSIS — M8589 Other specified disorders of bone density and structure, multiple sites: Secondary | ICD-10-CM

## 2024-02-04 MED ORDER — HYDROCHLOROTHIAZIDE 12.5 MG PO CAPS: 12.5000 mg | ORAL_CAPSULE | Freq: Every day | ORAL | 3 refills | Status: AC

## 2024-02-04 MED ORDER — ROSUVASTATIN CALCIUM 5 MG PO TABS
5.0000 mg | ORAL_TABLET | Freq: Every day | ORAL | 1 refills | Status: AC
Start: 2024-02-04 — End: ?

## 2024-02-04 NOTE — Patient Instructions (Addendum)
 Annual exam in 4 months  STOP weekly vit D when you finish what you have and start OTC D3, 1000 units every day  Collect today pls hydrochlorothiazide  12.5 mg and rosuvastatin  5 mg , for blood pressure and cholesterol  STOP hydrochlorothiazide  25 mg half tabs soon as you gett he 12.5 mg capsule and send me a message so I know all good!  Start Tums 2 every day for bones  Fasting labs 1 week before next appointment is lipid, cmp and eGFr and Vit D  It is important that you exercise regularly at least 30 minutes 5 times a week. If you develop chest pain, have severe difficulty breathing, or feel very tired, stop exercising immediately and seek medical attention   Please reduce ice cream  Thanks for choosing [facility], we consider it a privelige to serve you.

## 2024-02-05 ENCOUNTER — Encounter: Payer: Self-pay | Admitting: Family Medicine

## 2024-02-05 NOTE — Progress Notes (Signed)
   Christina Miles     MRN: [id number]      DOB: 05-22-1945  Chief Complaint  Patient presents with   Hypertension    Follow up     HPI Christina Miles is here for follow up and re-evaluation of chronic medical conditions, medication management and review of any available recent lab and radiology data.  Preventive health is updated, specifically  Cancer screening and Immunization.   Questions or concerns regarding consultations or procedures which the PT has had in the interim are  addressed. The PT denies any adverse reactions to current medications since the last visit.  There are no new concerns.  There are no specific complaints   ROS Denies recent fever or chills. Denies sinus pressure, nasal congestion, ear pain or sore throat. Denies chest congestion, productive cough or wheezing. Denies chest pains, palpitations and leg swelling Denies abdominal pain, nausea, vomiting,diarrhea or constipation.   Denies dysuria, frequency, hesitancy or incontinence. Denies joint pain, swelling and limitation in mobility. Denies headaches, seizures, numbness, or tingling. Denies depression, anxiety or insomnia. Denies skin break down or rash.   PE  BP 129/69   Pulse 76   Resp 16   Ht 5' 2 (1.575 m)   Wt 132 lb 1.9 oz (59.9 kg)   SpO2 96%   BMI 24.17 kg/m   Patient alert and oriented and in no cardiopulmonary distress.  HEENT: No facial asymmetry, EOMI,     Neck supple .  Chest: Clear to auscultation bilaterally.  CVS: S1, S2 no murmurs, no S3.Regular rate.  ABD: Soft non tender.   Ext: No edema  MS: Adequate ROM spine, shoulders, hips and knees.  Skin: Intact, no ulcerations or rash noted.  Psych: Good eye contact, normal affect. Memory intact not anxious or depressed appearing.  CNS: CN 2-12 intact, power,  normal throughout.no focal deficits noted.   Assessment & Plan  Essential hypertension Controlled, no change in medication DASH diet and commitment to daily  physical activity for a minimum of 30 minutes discussed and encouraged, as a part of hypertension management. The importance of attaining a healthy weight is also discussed.     02/04/2024   10:53 AM 09/09/2023    1:49 PM 08/01/2023    9:30 AM 08/01/2023    9:00 AM 06/18/2023   10:16 AM 04/30/2023   11:36 AM 04/30/2023   11:35 AM  BP/Weight  Systolic BP 129 107 128 150 118 124 130  Diastolic BP 69 62 80 77 64 72 80  Wt. (Lbs) 132.12 134.5  138.12 141.2    BMI 24.17 kg/m2 24.6 kg/m2  25.26 kg/m2 25.83 kg/m2         Hyperlipidemia LDL goal <100 Hyperlipidemia:Low fat diet discussed and encouraged.   Lipid Panel  Lab Results  Component Value Date   CHOL 242 (H) 01/21/2024   HDL 86 01/21/2024   LDLCALC 144 (H) 01/21/2024   TRIG 73 01/21/2024   CHOLHDL 2.8 01/21/2024     Needs to take med prescribed and lower fat intake  Osteopenia Will change to oTC vit D as adequately corrected, commit to calcium  1000 mg daily and weight bearing exercise  Vitamin D  deficiency Adequately replaced with prescription med , change to OTC daily lower dose once completed current medication  Influenza vaccination administered at current visit After obtaining informed consent, the influenza vaccine is  administered , with no adverse effect noted at the time of administration.

## 2024-02-09 DIAGNOSIS — Z23 Encounter for immunization: Secondary | ICD-10-CM | POA: Insufficient documentation

## 2024-02-09 NOTE — Assessment & Plan Note (Signed)
 Hyperlipidemia:Low fat diet discussed and encouraged.   Lipid Panel  Lab Results  Component Value Date   CHOL 242 (H) 01/21/2024   HDL 86 01/21/2024   LDLCALC 144 (H) 01/21/2024   TRIG 73 01/21/2024   CHOLHDL 2.8 01/21/2024     Needs to take med prescribed and lower fat intake

## 2024-02-09 NOTE — Assessment & Plan Note (Signed)
 Adequately replaced with prescription med , change to OTC daily lower dose once completed current medication

## 2024-02-09 NOTE — Assessment & Plan Note (Signed)
 Will change to oTC vit D as adequately corrected, commit to calcium  1000 mg daily and weight bearing exercise

## 2024-02-09 NOTE — Assessment & Plan Note (Signed)
 After obtaining informed consent, the influenza vaccine is  administered , with no adverse effect noted at the time of administration.

## 2024-02-09 NOTE — Assessment & Plan Note (Signed)
 Controlled, no change in medication DASH diet and commitment to daily physical activity for a minimum of 30 minutes discussed and encouraged, as a part of hypertension management. The importance of attaining a healthy weight is also discussed.     02/04/2024   10:53 AM 09/09/2023    1:49 PM 08/01/2023    9:30 AM 08/01/2023    9:00 AM 06/18/2023   10:16 AM 04/30/2023   11:36 AM 04/30/2023   11:35 AM  BP/Weight  Systolic BP 129 107 128 150 118 124 130  Diastolic BP 69 62 80 77 64 72 80  Wt. (Lbs) 132.12 134.5  138.12 141.2    BMI 24.17 kg/m2 24.6 kg/m2  25.26 kg/m2 25.83 kg/m2

## 2024-02-10 ENCOUNTER — Ambulatory Visit (HOSPITAL_COMMUNITY)
Admission: RE | Admit: 2024-02-10 | Discharge: 2024-02-10 | Disposition: A | Source: Ambulatory Visit | Attending: Family Medicine | Admitting: Family Medicine

## 2024-02-10 ENCOUNTER — Ambulatory Visit: Payer: Self-pay | Admitting: Family Medicine

## 2024-02-10 ENCOUNTER — Ambulatory Visit (HOSPITAL_COMMUNITY)
Admission: RE | Admit: 2024-02-10 | Discharge: 2024-02-10 | Disposition: A | Discharge status: Home / Self Care | Source: Ambulatory Visit | Attending: Family Medicine | Admitting: Family Medicine

## 2024-02-10 DIAGNOSIS — Z780 Asymptomatic menopausal state: Secondary | ICD-10-CM | POA: Diagnosis not present

## 2024-02-10 DIAGNOSIS — Z1231 Encounter for screening mammogram for malignant neoplasm of breast: Secondary | ICD-10-CM | POA: Insufficient documentation

## 2024-06-03 ENCOUNTER — Encounter: Admitting: Family Medicine

## 2024-06-11 ENCOUNTER — Ambulatory Visit (INDEPENDENT_AMBULATORY_CARE_PROVIDER_SITE_OTHER): Admitting: Gastroenterology

## 2024-09-09 ENCOUNTER — Ambulatory Visit

## 2024-09-11 ENCOUNTER — Ambulatory Visit
